# Patient Record
Sex: Female | Born: 1970 | Race: Black or African American | Hispanic: No | Smoking: Former smoker
Health system: Southern US, Community
[De-identification: ages and names within clinical notes are randomized; demographics above are authoritative.]

## PROBLEM LIST (undated history)

## (undated) DIAGNOSIS — I517 Cardiomegaly: Secondary | ICD-10-CM

## (undated) DIAGNOSIS — D259 Leiomyoma of uterus, unspecified: Secondary | ICD-10-CM

## (undated) DIAGNOSIS — D649 Anemia, unspecified: Secondary | ICD-10-CM

## (undated) DIAGNOSIS — K5909 Other constipation: Secondary | ICD-10-CM

## (undated) DIAGNOSIS — Z5189 Encounter for other specified aftercare: Secondary | ICD-10-CM

## (undated) DIAGNOSIS — I10 Essential (primary) hypertension: Secondary | ICD-10-CM

## (undated) HISTORY — PX: TUBAL LIGATION: SHX77

---

## 1998-04-03 ENCOUNTER — Encounter: Admission: RE | Admit: 1998-04-03 | Discharge: 1998-04-03 | Payer: Self-pay | Admitting: Family Medicine

## 1998-06-24 ENCOUNTER — Encounter: Admission: RE | Admit: 1998-06-24 | Discharge: 1998-06-24 | Payer: Self-pay | Admitting: Family Medicine

## 1998-11-03 ENCOUNTER — Emergency Department (HOSPITAL_COMMUNITY): Admission: EM | Admit: 1998-11-03 | Discharge: 1998-11-04 | Payer: Self-pay | Admitting: Emergency Medicine

## 1999-06-13 ENCOUNTER — Emergency Department (HOSPITAL_COMMUNITY): Admission: EM | Admit: 1999-06-13 | Discharge: 1999-06-13 | Payer: Self-pay | Admitting: Emergency Medicine

## 1999-11-17 ENCOUNTER — Emergency Department (HOSPITAL_COMMUNITY): Admission: EM | Admit: 1999-11-17 | Discharge: 1999-11-18 | Payer: Self-pay | Admitting: *Deleted

## 1999-11-18 ENCOUNTER — Encounter: Payer: Self-pay | Admitting: Emergency Medicine

## 1999-12-31 ENCOUNTER — Emergency Department (HOSPITAL_COMMUNITY): Admission: EM | Admit: 1999-12-31 | Discharge: 1999-12-31 | Payer: Self-pay | Admitting: Emergency Medicine

## 2000-03-11 ENCOUNTER — Emergency Department (HOSPITAL_COMMUNITY): Admission: EM | Admit: 2000-03-11 | Discharge: 2000-03-11 | Payer: Self-pay | Admitting: Emergency Medicine

## 2000-05-09 ENCOUNTER — Other Ambulatory Visit: Admission: RE | Admit: 2000-05-09 | Discharge: 2000-05-09 | Payer: Self-pay | Admitting: Obstetrics

## 2000-06-05 ENCOUNTER — Emergency Department (HOSPITAL_COMMUNITY): Admission: EM | Admit: 2000-06-05 | Discharge: 2000-06-05 | Payer: Self-pay | Admitting: Emergency Medicine

## 2000-09-30 ENCOUNTER — Emergency Department (HOSPITAL_COMMUNITY): Admission: EM | Admit: 2000-09-30 | Discharge: 2000-10-01 | Payer: Self-pay | Admitting: Emergency Medicine

## 2000-10-09 ENCOUNTER — Encounter: Payer: Self-pay | Admitting: Chiropractor

## 2000-10-09 ENCOUNTER — Ambulatory Visit: Admission: RE | Admit: 2000-10-09 | Discharge: 2000-10-09 | Payer: Self-pay | Admitting: Chiropractor

## 2000-10-22 ENCOUNTER — Emergency Department (HOSPITAL_COMMUNITY): Admission: EM | Admit: 2000-10-22 | Discharge: 2000-10-22 | Payer: Self-pay | Admitting: Emergency Medicine

## 2000-11-18 ENCOUNTER — Emergency Department (HOSPITAL_COMMUNITY): Admission: EM | Admit: 2000-11-18 | Discharge: 2000-11-18 | Payer: Self-pay | Admitting: Emergency Medicine

## 2001-01-24 ENCOUNTER — Emergency Department (HOSPITAL_COMMUNITY): Admission: EM | Admit: 2001-01-24 | Discharge: 2001-01-24 | Payer: Self-pay | Admitting: Emergency Medicine

## 2002-10-08 ENCOUNTER — Emergency Department (HOSPITAL_COMMUNITY): Admission: EM | Admit: 2002-10-08 | Discharge: 2002-10-08 | Payer: Self-pay | Admitting: Emergency Medicine

## 2002-11-16 ENCOUNTER — Emergency Department (HOSPITAL_COMMUNITY): Admission: EM | Admit: 2002-11-16 | Discharge: 2002-11-16 | Payer: Self-pay | Admitting: Emergency Medicine

## 2003-05-15 ENCOUNTER — Emergency Department (HOSPITAL_COMMUNITY): Admission: EM | Admit: 2003-05-15 | Discharge: 2003-05-15 | Payer: Self-pay | Admitting: Emergency Medicine

## 2003-09-08 ENCOUNTER — Emergency Department (HOSPITAL_COMMUNITY): Admission: EM | Admit: 2003-09-08 | Discharge: 2003-09-08 | Payer: Self-pay | Admitting: Emergency Medicine

## 2003-10-17 ENCOUNTER — Emergency Department (HOSPITAL_COMMUNITY): Admission: EM | Admit: 2003-10-17 | Discharge: 2003-10-17 | Payer: Self-pay | Admitting: Emergency Medicine

## 2004-01-23 ENCOUNTER — Emergency Department (HOSPITAL_COMMUNITY): Admission: EM | Admit: 2004-01-23 | Discharge: 2004-01-24 | Payer: Self-pay | Admitting: Emergency Medicine

## 2005-02-21 ENCOUNTER — Emergency Department (HOSPITAL_COMMUNITY): Admission: EM | Admit: 2005-02-21 | Discharge: 2005-02-21 | Payer: Self-pay | Admitting: Emergency Medicine

## 2005-08-13 ENCOUNTER — Emergency Department (HOSPITAL_COMMUNITY): Admission: EM | Admit: 2005-08-13 | Discharge: 2005-08-13 | Payer: Self-pay | Admitting: Emergency Medicine

## 2007-09-10 ENCOUNTER — Emergency Department (HOSPITAL_COMMUNITY): Admission: EM | Admit: 2007-09-10 | Discharge: 2007-09-10 | Payer: Self-pay | Admitting: Emergency Medicine

## 2009-02-03 ENCOUNTER — Emergency Department (HOSPITAL_COMMUNITY): Admission: EM | Admit: 2009-02-03 | Discharge: 2009-02-03 | Payer: Self-pay | Admitting: Family Medicine

## 2009-07-18 ENCOUNTER — Emergency Department (HOSPITAL_COMMUNITY): Admission: EM | Admit: 2009-07-18 | Discharge: 2009-07-18 | Payer: Self-pay | Admitting: Emergency Medicine

## 2010-01-09 ENCOUNTER — Emergency Department (HOSPITAL_COMMUNITY): Admission: EM | Admit: 2010-01-09 | Discharge: 2010-01-09 | Payer: Self-pay | Admitting: Emergency Medicine

## 2010-03-18 ENCOUNTER — Emergency Department (HOSPITAL_COMMUNITY): Admission: EM | Admit: 2010-03-18 | Discharge: 2010-03-18 | Payer: Self-pay | Admitting: Emergency Medicine

## 2010-08-16 ENCOUNTER — Emergency Department (HOSPITAL_COMMUNITY): Admission: EM | Admit: 2010-08-16 | Discharge: 2010-08-16 | Payer: Self-pay | Admitting: Emergency Medicine

## 2010-10-26 ENCOUNTER — Emergency Department (HOSPITAL_COMMUNITY): Admission: EM | Admit: 2010-10-26 | Discharge: 2010-10-26 | Payer: Self-pay | Admitting: Family Medicine

## 2010-11-08 ENCOUNTER — Emergency Department (HOSPITAL_COMMUNITY): Admission: EM | Admit: 2010-11-08 | Discharge: 2010-11-08 | Payer: Self-pay | Admitting: Emergency Medicine

## 2011-01-09 ENCOUNTER — Encounter: Payer: Self-pay | Admitting: Internal Medicine

## 2011-01-26 ENCOUNTER — Emergency Department (HOSPITAL_COMMUNITY)
Admission: EM | Admit: 2011-01-26 | Discharge: 2011-01-26 | Disposition: A | Discharge status: Home / Self Care | Payer: Medicaid Other | Attending: Emergency Medicine | Admitting: Emergency Medicine

## 2011-01-26 DIAGNOSIS — R300 Dysuria: Secondary | ICD-10-CM | POA: Insufficient documentation

## 2011-01-26 DIAGNOSIS — I10 Essential (primary) hypertension: Secondary | ICD-10-CM | POA: Insufficient documentation

## 2011-01-26 DIAGNOSIS — N390 Urinary tract infection, site not specified: Secondary | ICD-10-CM | POA: Insufficient documentation

## 2011-01-26 LAB — URINE MICROSCOPIC-ADD ON

## 2011-01-26 LAB — URINALYSIS, ROUTINE W REFLEX MICROSCOPIC
Hgb urine dipstick: NEGATIVE
Specific Gravity, Urine: 1.017 (ref 1.005–1.030)
Urobilinogen, UA: 0.2 mg/dL (ref 0.0–1.0)
pH: 6.5 (ref 5.0–8.0)

## 2011-03-01 ENCOUNTER — Inpatient Hospital Stay (INDEPENDENT_AMBULATORY_CARE_PROVIDER_SITE_OTHER)
Admission: RE | Admit: 2011-03-01 | Discharge: 2011-03-01 | Disposition: A | Discharge status: Home / Self Care | Payer: Self-pay | Source: Ambulatory Visit | Attending: Family Medicine | Admitting: Family Medicine

## 2011-03-01 DIAGNOSIS — R300 Dysuria: Secondary | ICD-10-CM

## 2011-03-01 DIAGNOSIS — I10 Essential (primary) hypertension: Secondary | ICD-10-CM

## 2011-03-01 LAB — WET PREP, GENITAL
Clue Cells Wet Prep HPF POC: NONE SEEN
Trich, Wet Prep: NONE SEEN
WBC, Wet Prep HPF POC: NONE SEEN

## 2011-03-01 LAB — POCT PREGNANCY, URINE: Preg Test, Ur: NEGATIVE

## 2011-03-01 LAB — GC/CHLAMYDIA PROBE AMP, GENITAL: GC Probe Amp, Genital: NEGATIVE

## 2011-03-01 LAB — POCT URINALYSIS DIPSTICK
Bilirubin Urine: NEGATIVE
Ketones, ur: NEGATIVE mg/dL

## 2011-03-02 LAB — URINE CULTURE

## 2011-03-07 LAB — URINALYSIS, ROUTINE W REFLEX MICROSCOPIC
Leukocytes, UA: NEGATIVE
Nitrite: NEGATIVE
Specific Gravity, Urine: 1.019 (ref 1.005–1.030)
pH: 6 (ref 5.0–8.0)

## 2011-03-07 LAB — URINE MICROSCOPIC-ADD ON

## 2011-03-27 LAB — URINALYSIS, ROUTINE W REFLEX MICROSCOPIC
Bilirubin Urine: NEGATIVE
Protein, ur: 100 mg/dL — AB
Specific Gravity, Urine: 1.021 (ref 1.005–1.030)
Urobilinogen, UA: 0.2 mg/dL (ref 0.0–1.0)

## 2011-03-27 LAB — URINE MICROSCOPIC-ADD ON

## 2011-07-05 ENCOUNTER — Inpatient Hospital Stay (INDEPENDENT_AMBULATORY_CARE_PROVIDER_SITE_OTHER)
Admission: RE | Admit: 2011-07-05 | Discharge: 2011-07-05 | Disposition: A | Discharge status: Home / Self Care | Payer: Medicaid Other | Source: Ambulatory Visit | Attending: Emergency Medicine | Admitting: Emergency Medicine

## 2011-07-05 DIAGNOSIS — N760 Acute vaginitis: Secondary | ICD-10-CM

## 2011-07-05 DIAGNOSIS — A499 Bacterial infection, unspecified: Secondary | ICD-10-CM

## 2011-07-05 LAB — WET PREP, GENITAL
Trich, Wet Prep: NONE SEEN
Yeast Wet Prep HPF POC: NONE SEEN

## 2011-09-29 LAB — URINALYSIS, ROUTINE W REFLEX MICROSCOPIC
Bilirubin Urine: NEGATIVE
Glucose, UA: NEGATIVE
Ketones, ur: NEGATIVE
Protein, ur: 100 — AB

## 2011-09-29 LAB — URINE MICROSCOPIC-ADD ON

## 2012-03-06 ENCOUNTER — Encounter (HOSPITAL_COMMUNITY): Payer: Self-pay | Admitting: *Deleted

## 2012-03-06 ENCOUNTER — Emergency Department (INDEPENDENT_AMBULATORY_CARE_PROVIDER_SITE_OTHER)
Admission: EM | Admit: 2012-03-06 | Discharge: 2012-03-06 | Disposition: A | Discharge status: Home / Self Care | Payer: Self-pay | Source: Home / Self Care | Attending: Emergency Medicine | Admitting: Emergency Medicine

## 2012-03-06 DIAGNOSIS — J019 Acute sinusitis, unspecified: Secondary | ICD-10-CM

## 2012-03-06 DIAGNOSIS — J069 Acute upper respiratory infection, unspecified: Secondary | ICD-10-CM

## 2012-03-06 HISTORY — DX: Essential (primary) hypertension: I10

## 2012-03-06 LAB — POCT RAPID STREP A: Streptococcus, Group A Screen (Direct): NEGATIVE

## 2012-03-06 MED ORDER — BENZONATATE 200 MG PO CAPS: 200.0000 mg | ORAL_CAPSULE | Freq: Three times a day (TID) | ORAL | Status: AC | PRN

## 2012-03-06 MED ORDER — AZITHROMYCIN 250 MG PO TABS: ORAL_TABLET | ORAL | Status: AC

## 2012-03-06 NOTE — ED Notes (Signed)
Gradual onset 4 days ago sore throat, runny nose, bilateral  Earache and low grade fever.  Today c/o productive cough, and generalized myalgias

## 2012-03-06 NOTE — ED Provider Notes (Signed)
Chief Complaint  Patient presents with  . URI    History of Present Illness:   The patient is a 41 year old female who's had a four-day history of fever, chills, earache, sore throat, cough productive yellow-green sputum, chest pain when she coughs, nasal congestion with yellow-green drainage, sinus pain, and headache. She denies any wheezing, nausea, vomiting, or diarrhea.  Review of Systems:  Other than noted above, the patient denies any of the following symptoms. Systemic:  No fever, chills, sweats, fatigue, myalgias, headache, or anorexia. Eye:  No redness, pain or drainage. ENT:  No earache, nasal congestion, rhinorrhea, sinus pressure, or sore throat. Lungs:  No cough, sputum production, wheezing, shortness of breath. Or chest pain. GI:  No nausea, vomiting, abdominal pain or diarrhea. Skin:  No rash or itching.  PMFSH:  Past medical history, family history, social history, meds, and allergies were reviewed.  Physical Exam:   Vital signs:  BP 169/87  Pulse 68  Temp(Src) 98.9 F (37.2 C) (Oral)  Resp 20  SpO2 100%  LMP 02/03/2012 General:  Alert, in no distress. Eye:  No conjunctival injection or drainage. ENT:  TMs and canals were normal, without erythema or inflammation.  Nasal mucosa was clear and uncongested, without drainage.  Mucous membranes were moist.  Pharynx was clear, without exudate or drainage.  There were no oral ulcerations or lesions. Neck:  Supple, no adenopathy, tenderness or mass. Lungs:  No respiratory distress.  Lungs were clear to auscultation, without wheezes, rales or rhonchi.  Breath sounds were clear and equal bilaterally. Heart:  Regular rhythm, without gallops, murmers or rubs. Skin:  Clear, warm, and dry, without rash or lesions.  Labs:   Results for orders placed during the hospital encounter of 03/06/12  POCT RAPID STREP A ([facility] URG CARE ONLY)      Component Value Range   Streptococcus, Group A Screen (Direct) NEGATIVE  NEGATIVE       Radiology:  No results found.  Assessment:   Diagnoses that have been ruled out:  None  Diagnoses that are still under consideration:  None  Final diagnoses:  Viral upper respiratory infection  Acute sinus infection      Plan:   1.  The following meds were prescribed:   New Prescriptions   AZITHROMYCIN (ZITHROMAX Z-PAK) 250 MG TABLET    Take as directed.   BENZONATATE (TESSALON) 200 MG CAPSULE    Take 1 capsule (200 mg total) by mouth 3 (three) times daily as needed for cough.   2.  The patient was instructed in symptomatic care and handouts were given. 3.  The patient was told to return if becoming worse in any way, if no better in 3 or 4 days, and given some red flag symptoms that would indicate earlier return.   Reuben Likes, MD 03/06/12 1524

## 2012-03-06 NOTE — Discharge Instructions (Signed)
Most upper respiratory infections are caused by viruses and do not require antibiotics.  We try to save the antibiotics for when we really need them to avoid resistance.  This does not mean that there is nothing that can be done.  Here are a few hints about things that can be done at home to get over an upper respiratory infection quicker:  Get extra sleep and extra fluids.  Get 7 to 9 hours of sleep per night and 6 to 8 glasses of water a day.  Getting extra sleep keeps the immune system from getting run down.  Most people with an upper respiratory infection are a little dehydrated.  The extra fluids also keep the secretions liquified and easier to deal with.  Also, get extra vitamin C.  4000 mg per day is the recommended dose. For the aches, headache, and fever, acetaminophen or ibuprofen are helpful.  These can be alternated every 4 hours.  People with liver disease should avoid large amounts of acetaminophen, and people with ulcer disease, gastroesophageal reflux, gastritis, congestive heart failure, chronic kidney disease, coronary artery disease and the elderly should avoid ibuprofen. For nasal congestion try Mucinex-D, or if you're having lots of sneezing or copious clear nasal drainage Allegra-D-24 hour.  A Saline nasal spray such as Ocean Spray can also help as can decongestant sprays such as Afrin, but you should not use the decongestant sprays for more than 3 or 4 days since they can be habituating.  If nasal dryness is a problem, Ayr Nasal Gel can help moisturize your nasal passages.  Breath Rite nasal strips can also offer a non-drug alternative treatment to nasal congestion, especially at night. For people with symptoms of sinusitis, sleeping with your head elevated can be helpful.  For sinus pain, moist, hot compresses to the face may provide some relief.  Many people find that inhaling steam as in a shower or from a pot of steaming water can help. For sore throat, zinc containing lozenges such  as Cold-Eze or Zicam are helpful.  Zinc helps to fight infection and has a mild astringent effect that relieves the sore, achey throat.  Hot salt water gargles (8 oz of hot water, 1/2 tsp of table salt, and a pinch of baking soda) can give relief as well as hot beverages such as hot tea. For the cough, old time remedies such as honey or honey and lemon are tried and true.  Over the counter cough syrups such as Delsym 2 tsp every 12 hours can help as well.  It's important when you have an upper respiratory infection not to pass the infection to others.  This involves being very careful about the following:  Frequent hand washing or use of hand sanitizer, especially after coughing, sneezing, blowing your nose or touching your face, nose or eyes. Do not shake hands or touch anyone and try to avoid touching surfaces that other people use such as doorknobs, shopping carts, telephones and computer keyboards. Use tissues and dispose of them properly in a garbage can or ziplock bag. Cough into your sleeve. Do not let others eat or drink after you.  It's also important to recognize the signs of serious illness and get evaluated if they occur: Any respiratory infection that lasts more than 7 to 10 days.  Yellow nasal drainage and sputum are not reliable indicators of a bacterial infection, but if they last for more than 1 week, see your doctor. Fever and sore throat can indicate strep. Fever   and cough can indicate influenza or pneumonia. Any kind of severe symptom such as difficulty breathing, intractable vomiting, or severe pain should prompt you to see a doctor as soon as possible.   Your body's immune system is really the thing that will get rid of this infection.  Your immune system is comprised of 2 types of specialized cells called T cells and B cells.  T cells coordinate the array of cells in your body that engulf invading bacteria or viruses while B cells orchestrate the production of antibodies that  neutralize infection.  Anything we do or any medications we give you, will just strengthen your immune system or help it clear up the infection quicker.  Here are a few helpful hints to improve your immune system to help overcome this illness or to prevent future infections:  A few vitamins can improve the health of your immune system.  That's why your diet should include plenty of fruits, vegetables, fish, nuts, and whole grains.  Vitamin A and bet-carotene can increase the cells that fight infections (T cells and B cells).  Vitamin A is abundant in dark greens and orange vegetables such as spinach, greens, sweet potatoes, and carrots.  Vitamin B6 contributes to the maturation of white blood cells, the cells that fight disease.  Foods with vitamin B6 include cold cereal and bananas.  Vitamin C is credited with preventing colds because it increases white blood cells and also prevents cellular damage.  Citrus fruits, peaches and green and red bell peppers are all hight in vitamin C.  Vitamin E is an anti-oxidant that encourages the production of natural killer cells which reject foreign invaders and B cells that produce antibodies.  Foods high in vitamin E include wheat germ, nuts and seeds.  Foods high in omega-3 fatty acids found in foods like salmon, tuna and mackerel boost your immune system and help cells to engulf and absorb germs.  Probiotics are good bacteria that increase your T cells.  These can be found in yogurt and are available in supplements such as Culturelle or Align.  Moderate exercise increases the strength of your immune system and your ability to recover from illness.  I suggest 3 to 5 moderate intensity 30 minute workouts per week.    Sleep is another component of maintaining a strong immune system.  It enables your body to recuperate from the day's activities, stress and work.  My recommendation is to get between 7 and 9 hours of sleep per night.  If you smoke, try to quit  completely or at least cut down.  Drink alcohol only in moderation if at all.  No more than 2 drinks daily for men or 1 for women.  Get a flu vaccine early in the fall or if you have not gotten one yet, once this illness has run its course.  If you are over 65, a smoker, or an asthmatic, get a pneumococcal vaccine.  My final recommendation is to maintain a healthy weight.  Excess weight can impair the immune system by interfering with the way the immune system deals with invading viruses or bacteria.    Go to www.goodrx.com to look up your medications. This will give you a list of where you can find your prescriptions at the most affordable prices.   RESOURCE GUIDE  Chronic Pain Problems Contact [facility]  [phone] Patients need to be referred by their primary care doctor.  Insufficient Money for Medicine Contact [employer]:  call "211" or [facility] [phone].  No Primary Care Doctor Call [facility]  [phone] Other agencies that provide inexpensive medical care    [facility]  [phone]    [facility]  [phone]    [facility]  [phone]    [facility]  [phone] [address] [place] [address]    [facility]  [phone]    [facility]  [phone] [facility] [phone]   [address] [place]. [address] [place], [place] [address]  Psychological Services [facility]  [phone] [facility]  [phone] [facility] Mental [facility]   [phone] (emergency services [phone])  Substance Abuse Resources [facility]  [phone] [facility] [phone] [facility] [phone] Floydene Flock [phone] Residential & Outpatient Substance Abuse Program  [phone]  Abuse/Neglect [facility] Child Abuse Hotline 505-800-4332 [facility] Child Abuse Hotline [phone] (After  Hours)  Emergency Shelter [facility] [phone]  Maternity Homes [facility] [place] [facility] [phone] [facility] [phone]  MRSA Hotline #:   321-599-7591                                                Crist[facility] Goldmann Phone:  [phone]                                   Phone:  [phone]                 Phone:  [phone]  [facility] Mental Health Phone:  [phone]  [facility] Child Abuse Hotline 828-775-2318    [facility] Resources  [facility]     United Way                          [facility] Dept. [address] [place]                       [address]      [address] [place] [address]   [phone] (After Hours)

## 2012-05-30 ENCOUNTER — Emergency Department (INDEPENDENT_AMBULATORY_CARE_PROVIDER_SITE_OTHER)
Admission: EM | Admit: 2012-05-30 | Discharge: 2012-05-30 | Disposition: A | Discharge status: Home / Self Care | Payer: Self-pay | Source: Home / Self Care | Attending: Emergency Medicine | Admitting: Emergency Medicine

## 2012-05-30 ENCOUNTER — Encounter (HOSPITAL_COMMUNITY): Payer: Self-pay | Admitting: *Deleted

## 2012-05-30 DIAGNOSIS — M25569 Pain in unspecified knee: Secondary | ICD-10-CM

## 2012-05-30 DIAGNOSIS — R05 Cough: Secondary | ICD-10-CM

## 2012-05-30 DIAGNOSIS — N898 Other specified noninflammatory disorders of vagina: Secondary | ICD-10-CM

## 2012-05-30 HISTORY — DX: Leiomyoma of uterus, unspecified: D25.9

## 2012-05-30 MED ORDER — ACETAMINOPHEN-CODEINE #3 300-30 MG PO TABS: 1.0000 | ORAL_TABLET | Freq: Four times a day (QID) | ORAL | Status: AC | PRN

## 2012-05-30 MED ORDER — METRONIDAZOLE 500 MG PO TABS: 500.0000 mg | ORAL_TABLET | Freq: Two times a day (BID) | ORAL | Status: AC

## 2012-05-30 NOTE — ED Provider Notes (Signed)
History     CSN: [id number]  Arrival date & time 05/30/12  1146   First MD Initiated Contact with Patient 05/30/12 1154      Chief Complaint  Patient presents with  . Leg Pain    (Consider location/radiation/quality/duration/timing/severity/associated sxs/prior treatment) HPI Comments: My son had pneumonia, and I have been coughing for almost 2 weeks, no fevers, no SOB, but the cough is not going away. My R knee has been hurting as well, i have not fallen or injured my knee in any way"  I also been having this discharge with an odor  for more than a week, feels like BV,with a strong odor. No pelvic pain, No urinary symptoms, no fevers  Patient is a 41 y.o. female presenting with leg pain. The history is provided by the patient.  Leg Pain  The incident occurred more than 1 week ago. The incident occurred at home. There was no injury mechanism. The pain is present in the right knee. The pain is at a severity of 5/10. The pain is moderate. Pertinent negatives include no numbness, no inability to bear weight, no loss of motion, no muscle weakness, no loss of sensation and no tingling.    Past Medical History  Diagnosis Date  . Hypertension   . Uterine fibroid     Past Surgical History  Procedure Date  . Tubal ligation     Family History  Problem Relation Age of Onset  . Hypertension Mother     History  Substance Use Topics  . Smoking status: Current Some Day Smoker    Types: Cigarettes    Last Attempt to Quit: 02/23/2011  . Smokeless tobacco: Not on file  . Alcohol Use: Yes     occasionally    OB History    Grav Para Term Preterm Abortions TAB SAB Ect Mult Living                  Review of Systems  Constitutional: Negative for fever, chills, activity change, appetite change and fatigue.  HENT: Positive for ear pain. Negative for hearing loss, neck pain and ear discharge.   Respiratory: Positive for cough. Negative for chest tightness, shortness of breath,  wheezing and stridor.   Musculoskeletal: Positive for joint swelling.  Skin: Negative for pallor and rash.  Neurological: Negative for tingling and numbness.    Allergies  Penicillins  Home Medications   Current Outpatient Rx  Name Route Sig Dispense Refill  . HYDROCHLOROTHIAZIDE 25 MG PO TABS Oral Take 25 mg by mouth daily.    . ACETAMINOPHEN-CODEINE #3 300-30 MG PO TABS Oral Take 1-2 tablets by mouth every 6 (six) hours as needed for pain. 15 tablet 0  . METRONIDAZOLE 500 MG PO TABS Oral Take 1 tablet (500 mg total) by mouth 2 (two) times daily. 14 tablet 0    BP 177/81  Pulse 62  Temp 98 F (36.7 C) (Oral)  Resp 20  SpO2 100%  LMP 05/07/2012  Physical Exam  Nursing note and vitals reviewed. Constitutional: She appears well-developed and well-nourished.  Eyes: Conjunctivae are normal. Right eye exhibits no discharge. Left eye exhibits no discharge.  Neck: Neck supple. No JVD present.  Cardiovascular: Normal rate.   No murmur heard. Pulmonary/Chest: Effort normal and breath sounds normal. No respiratory distress. She has no wheezes. She has no rales.  Abdominal: Soft. She exhibits no distension.  Lymphadenopathy:    She has no cervical adenopathy.  Skin: Skin is warm.  ED Course  Procedures (including critical care time)  Labs Reviewed - No data to display No results found.   1. Cough   2. Knee pain   3. Vaginal Discharge       MDM  Respiratory exam normal. Patient with recurrent R knee effusions, and requested treatment for BV, ( Refusing exam and requesting referral for Gyn.        Jimmie Molly, MD 05/30/12 [phone]

## 2012-05-30 NOTE — Discharge Instructions (Signed)
AS. discussed take this medicine for both your R knee pain and your residual cough. Your exam was not consistent with pneumonia, any concerns or further symptoms should return for recheck.  If you've continue with right knee pain. Should followup with the orthopedic Dr. As your knee effusion might get worse and might need aspiration.  You also described symptoms consistent of vaginitis and requested a Flagyl prescription I have agreed to do this as you described having had this before any particular order. See discharge instructions for you to establish with a gynecologist.    Cough, Adult  A cough is a reflex that helps clear your throat and airways. It can help heal the body or may be a reaction to an irritated airway. A cough may only last 2 or 3 weeks (acute) or may last more than 8 weeks (chronic).  CAUSES Acute cough:  Viral or bacterial infections.  Chronic cough:  Infections.   Allergies.   Asthma.   Post-nasal drip.   Smoking.   Heartburn or acid reflux.   Some medicines.   Chronic lung problems (COPD).   Cancer.  SYMPTOMS   Cough.   Fever.   Chest pain.   Increased breathing rate.   High-pitched whistling sound when breathing (wheezing).   Colored mucus that you cough up (sputum).  TREATMENT   A bacterial cough may be treated with antibiotic medicine.   A viral cough must run its course and will not respond to antibiotics.   Your caregiver may recommend other treatments if you have a chronic cough.  HOME CARE INSTRUCTIONS   Only take over-the-counter or prescription medicines for pain, discomfort, or fever as directed by your caregiver. Use cough suppressants only as directed by your caregiver.   Use a cold steam vaporizer or humidifier in your bedroom or home to help loosen secretions.   Sleep in a semi-upright position if your cough is worse at night.   Rest as needed.   Stop smoking if you smoke.  SEEK IMMEDIATE MEDICAL CARE IF:   You  have pus in your sputum.   Your cough starts to worsen.   You cannot control your cough with suppressants and are losing sleep.   You begin coughing up blood.   You have difficulty breathing.   You develop pain which is getting worse or is uncontrolled with medicine.   You have a fever.  MAKE SURE YOU:   Understand these instructions.   Will watch your condition.   Will get help right away if you are not doing well or get worse.  Document Released: 06/03/2011 Document Revised: 11/24/2011 Document Reviewed: 06/03/2011 [facility] Patient Information 2012 [place], [place].Arthralgia Your caregiver has diagnosed you as suffering from an arthralgia. Arthralgia means there is pain in a joint. This can come from many reasons including:  Bruising the joint which causes soreness (inflammation) in the joint.   Wear and tear on the joints which occur as we grow older (osteoarthritis).   Overusing the joint.   Various forms of arthritis.   Infections of the joint.  Regardless of the cause of pain in your joint, most of these different pains respond to anti-inflammatory drugs and rest. The exception to this is when a joint is infected, and these cases are treated with antibiotics, if it is a bacterial infection. HOME CARE INSTRUCTIONS   Rest the injured area for as long as directed by your caregiver. Then slowly start using the joint as directed by your caregiver and as  the pain allows. Crutches as directed may be useful if the ankles, knees or hips are involved. If the knee was splinted or casted, continue use and care as directed. If an stretchy or elastic wrapping bandage has been applied today, it should be removed and re-applied every 3 to 4 hours. It should not be applied tightly, but firmly enough to keep swelling down. Watch toes and feet for swelling, bluish discoloration, coldness, numbness or excessive pain. If any of these problems (symptoms) occur, remove the ace bandage and re-apply  more loosely. If these symptoms persist, contact your caregiver or return to this location.   For the first 24 hours, keep the injured extremity elevated on pillows while lying down.   Apply ice for 15 to 20 minutes to the sore joint every couple hours while awake for the first half day. Then 3 to 4 times per day for the first 48 hours. Put the ice in a plastic bag and place a towel between the bag of ice and your skin.   Wear any splinting, casting, elastic bandage applications, or slings as instructed.   Only take over-the-counter or prescription medicines for pain, discomfort, or fever as directed by your caregiver. Do not use aspirin immediately after the injury unless instructed by your physician. Aspirin can cause increased bleeding and bruising of the tissues.   If you were given crutches, continue to use them as instructed and do not resume weight bearing on the sore joint until instructed.  Persistent pain and inability to use the sore joint as directed for more than 2 to 3 days are warning signs indicating that you should see a caregiver for a follow-up visit as soon as possible. Initially, a hairline fracture (break in bone) may not be evident on X-rays. Persistent pain and swelling indicate that further evaluation, non-weight bearing or use of the joint (use of crutches or slings as instructed), or further X-rays are indicated. X-rays may sometimes not show a small fracture until a week or 10 days later. Make a follow-up appointment with your own caregiver or one to whom we have referred you. A radiologist (specialist in reading X-rays) may read your X-rays. Make sure you know how you are to obtain your X-ray results. Do not assume everything is normal if you do not hear from [place]. SEEK MEDICAL CARE IF: Bruising, swelling, or pain increases. SEEK IMMEDIATE MEDICAL CARE IF:   Your fingers or toes are numb or blue.   The pain is not responding to medications and continues to stay the same  or get worse.   The pain in your joint becomes severe.   You develop a fever over 102 F (38.9 C).   It becomes impossible to move or use the joint.  MAKE SURE YOU:   Understand these instructions.   Will watch your condition.   Will get help right away if you are not doing well or get worse.  Document Released: 12/05/2005 Document Revised: 11/24/2011 Document Reviewed: 07/23/2008 [facility] Patient Information 2012 [place], [place].

## 2012-05-30 NOTE — ED Notes (Signed)
Pt reports 2 weeks of a nonproductive cough and several days of right ear pain   Denies fever .  Also she c/o pain under right knee X 2 weeks   Denies any recent injury

## 2012-10-29 ENCOUNTER — Encounter (HOSPITAL_COMMUNITY): Payer: Self-pay

## 2012-10-29 ENCOUNTER — Emergency Department (INDEPENDENT_AMBULATORY_CARE_PROVIDER_SITE_OTHER)
Admission: EM | Admit: 2012-10-29 | Discharge: 2012-10-29 | Disposition: A | Discharge status: Home / Self Care | Payer: Medicaid Other | Source: Home / Self Care | Attending: Family Medicine | Admitting: Family Medicine

## 2012-10-29 DIAGNOSIS — I10 Essential (primary) hypertension: Secondary | ICD-10-CM

## 2012-10-29 DIAGNOSIS — J069 Acute upper respiratory infection, unspecified: Secondary | ICD-10-CM

## 2012-10-29 LAB — POCT I-STAT, CHEM 8
BUN: 7 mg/dL (ref 6–23)
Chloride: 105 mEq/L (ref 96–112)
HCT: 26 % — ABNORMAL LOW (ref 36.0–46.0)
Potassium: 3.9 mEq/L (ref 3.5–5.1)
Sodium: 141 mEq/L (ref 135–145)

## 2012-10-29 LAB — POCT URINALYSIS DIP (DEVICE)
Bilirubin Urine: NEGATIVE
Glucose, UA: NEGATIVE mg/dL
Ketones, ur: NEGATIVE mg/dL
Leukocytes, UA: NEGATIVE
Nitrite: NEGATIVE

## 2012-10-29 MED ORDER — AZITHROMYCIN 250 MG PO TABS
ORAL_TABLET | ORAL | Status: DC
Start: 2012-10-29 — End: 2014-07-09

## 2012-10-29 MED ORDER — SALINE NASAL SPRAY 0.65 % NA SOLN: 1.0000 | NASAL | Status: DC | PRN

## 2012-10-29 MED ORDER — HYDROCHLOROTHIAZIDE 25 MG PO TABS: 25.0000 mg | ORAL_TABLET | Freq: Every day | ORAL | Status: DC

## 2012-10-29 NOTE — ED Provider Notes (Signed)
Medical screening examination/treatment/procedure(s) were performed by resident physician or non-physician practitioner and as supervising physician I was immediately available for consultation/collaboration.   KINDL,JAMES DOUGLAS MD.    James D Kindl, MD 10/29/12 2034 

## 2012-10-29 NOTE — ED Notes (Addendum)
Cough (productive-yellow) , fatigue, right ear pain, congestion, drainage, states she does have hypertension but has no PCP at this time, has not taken HCTZ for approx 5 mos, patient also reports that she has a enlarged heart

## 2012-10-29 NOTE — ED Provider Notes (Signed)
History     CSN: [id number]  Arrival date & time 10/29/12  1227   First MD Initiated Contact with Patient 10/29/12 1436      Chief Complaint  Patient presents with  . URI    (Consider location/radiation/quality/duration/timing/severity/associated sxs/prior treatment) Patient is a 41 y.o. female presenting with URI. The history is provided by the patient.  URI The primary symptoms include fatigue, ear pain and cough. The current episode started more than 1 week ago. This is a new problem. The problem has not changed since onset. The fatigue began more than 1 week ago.  The ear pain began more than 2 days ago. Ear pain is a new problem. Both ears are affected. The pain is mild.    The cough began more than 1 week ago. The cough is new. The cough is productive and hoarse. The sputum is yellow. It is exacerbated by smoking.  The onset of the illness is associated with exposure to sick contacts and recent travel. Symptoms associated with the illness include chills, plugged ear sensation, congestion and rhinorrhea. The illness is not associated with facial pain or sinus pressure. The following treatments were addressed: Acetaminophen was not tried. A decongestant was not tried. Aspirin was not tried. NSAIDs were not tried.    Pt additionally reports she has been without her HBP medication for five months, known history of cardiomegaly with noted edema in bilateral extremities.  Denies chest pain and sob.  Past Medical History  Diagnosis Date  . Hypertension   . Uterine fibroid     Past Surgical History  Procedure Date  . Tubal ligation     Family History  Problem Relation Age of Onset  . Hypertension Mother     History  Substance Use Topics  . Smoking status: Current Some Day Smoker    Types: Cigarettes    Last Attempt to Quit: 02/23/2011  . Smokeless tobacco: Not on file  . Alcohol Use: Yes     Comment: occasionally    OB History    Grav Para Term Preterm Abortions  TAB SAB Ect Mult Living                  Review of Systems  Constitutional: Positive for chills and fatigue.  HENT: Positive for ear pain, congestion and rhinorrhea. Negative for sinus pressure.   Respiratory: Positive for cough.   Cardiovascular: Positive for leg swelling.  All other systems reviewed and are negative.    Allergies  Penicillins  Home Medications   Current Outpatient Rx  Name  Route  Sig  Dispense  Refill  . AZITHROMYCIN 250 MG PO TABS      Azithromycin 500mg  on day 1, then 250mg  on days 2-4   6 tablet   0   . HYDROCHLOROTHIAZIDE 25 MG PO TABS   Oral   Take 1 tablet (25 mg total) by mouth daily.   30 tablet   2   . SALINE NASAL SPRAY 0.65 % NA SOLN   Nasal   Place 1 spray into the nose as needed for congestion.   30 mL   12     BP 197/59  Pulse 64  Temp 98.5 F (36.9 C) (Oral)  Resp 16  SpO2 99%  Physical Exam  Nursing note and vitals reviewed. Constitutional: She is oriented to person, place, and time. Vital signs are normal. She appears well-developed and well-nourished. She is active and cooperative.  HENT:  Head: Normocephalic.  Right Ear: External  ear normal.  Left Ear: External ear normal.  Nose: Nose normal.  Mouth/Throat: Oropharynx is clear and moist. No oropharyngeal exudate.  Eyes: Conjunctivae normal and EOM are normal. Pupils are equal, round, and reactive to light. No scleral icterus.  Neck: Trachea normal and normal range of motion. Neck supple. No JVD present. Carotid bruit is not present. No thyromegaly present.  Cardiovascular: Normal rate, regular rhythm, normal heart sounds, intact distal pulses and normal pulses.        Bilateral nonpitting edema   Pulmonary/Chest: Effort normal and breath sounds normal.  Lymphadenopathy:       Head (right side): No submental, no submandibular, no tonsillar, no preauricular, no posterior auricular and no occipital adenopathy present.       Head (left side): No submental, no  submandibular, no tonsillar, no preauricular, no posterior auricular and no occipital adenopathy present.    She has no cervical adenopathy.  Neurological: She is alert and oriented to person, place, and time. No cranial nerve deficit or sensory deficit.  Skin: Skin is warm and dry.  Psychiatric: She has a normal mood and affect. Her speech is normal and behavior is normal. Judgment and thought content normal. Cognition and memory are normal.    ED Course  Procedures (including critical care time)  Labs Reviewed  POCT URINALYSIS DIP (DEVICE) - Abnormal; Notable for the following:    Protein, ur 100 (*)     All other components within normal limits  POCT I-STAT, CHEM 8 - Abnormal; Notable for the following:    Hemoglobin 8.8 (*)     HCT 26.0 (*)     All other components within normal limits   No results found.   1. Hypertension   2. URI (upper respiratory infection)       MDM  Take medications as prescribed.  It is imperative for you to follow up with a pcp in the next month for further evaluation and management of your hypertension.          Johnsie Kindred, NP 10/29/12 1527

## 2013-09-25 ENCOUNTER — Ambulatory Visit: Payer: Self-pay

## 2013-09-26 ENCOUNTER — Encounter: Payer: Self-pay | Admitting: Internal Medicine

## 2013-09-26 ENCOUNTER — Ambulatory Visit: Payer: Self-pay | Admitting: Internal Medicine

## 2014-04-17 ENCOUNTER — Encounter (HOSPITAL_COMMUNITY): Payer: Self-pay | Admitting: Emergency Medicine

## 2014-04-17 ENCOUNTER — Emergency Department (INDEPENDENT_AMBULATORY_CARE_PROVIDER_SITE_OTHER)
Admission: EM | Admit: 2014-04-17 | Discharge: 2014-04-17 | Disposition: A | Discharge status: Home / Self Care | Payer: Self-pay | Source: Home / Self Care | Attending: Emergency Medicine | Admitting: Emergency Medicine

## 2014-04-17 ENCOUNTER — Other Ambulatory Visit (HOSPITAL_COMMUNITY)
Admission: RE | Admit: 2014-04-17 | Discharge: 2014-04-17 | Disposition: A | Discharge status: Home / Self Care | Payer: Self-pay | Source: Ambulatory Visit | Attending: Emergency Medicine | Admitting: Emergency Medicine

## 2014-04-17 DIAGNOSIS — B9689 Other specified bacterial agents as the cause of diseases classified elsewhere: Secondary | ICD-10-CM

## 2014-04-17 DIAGNOSIS — D649 Anemia, unspecified: Secondary | ICD-10-CM

## 2014-04-17 DIAGNOSIS — I10 Essential (primary) hypertension: Secondary | ICD-10-CM

## 2014-04-17 DIAGNOSIS — A499 Bacterial infection, unspecified: Secondary | ICD-10-CM

## 2014-04-17 DIAGNOSIS — N760 Acute vaginitis: Secondary | ICD-10-CM

## 2014-04-17 DIAGNOSIS — Z113 Encounter for screening for infections with a predominantly sexual mode of transmission: Secondary | ICD-10-CM | POA: Insufficient documentation

## 2014-04-17 LAB — POCT I-STAT, CHEM 8
BUN: 11 mg/dL (ref 6–23)
CALCIUM ION: 1.18 mmol/L (ref 1.12–1.23)
CREATININE: 1.2 mg/dL — AB (ref 0.50–1.10)
Chloride: 103 mEq/L (ref 96–112)
GLUCOSE: 93 mg/dL (ref 70–99)
HEMATOCRIT: 23 % — AB (ref 36.0–46.0)
HEMOGLOBIN: 7.8 g/dL — AB (ref 12.0–15.0)
Potassium: 4 mEq/L (ref 3.7–5.3)
Sodium: 142 mEq/L (ref 137–147)
TCO2: 24 mmol/L (ref 0–100)

## 2014-04-17 LAB — POCT URINALYSIS DIP (DEVICE)
BILIRUBIN URINE: NEGATIVE
GLUCOSE, UA: NEGATIVE mg/dL
HGB URINE DIPSTICK: NEGATIVE
Ketones, ur: NEGATIVE mg/dL
Leukocytes, UA: NEGATIVE
NITRITE: NEGATIVE
Protein, ur: 100 mg/dL — AB
Specific Gravity, Urine: 1.025 (ref 1.005–1.030)
UROBILINOGEN UA: 0.2 mg/dL (ref 0.0–1.0)
pH: 6 (ref 5.0–8.0)

## 2014-04-17 MED ORDER — HYDROCHLOROTHIAZIDE 25 MG PO TABS: 25.0000 mg | ORAL_TABLET | Freq: Every day | ORAL | Status: DC

## 2014-04-17 MED ORDER — DOCUSATE SODIUM 100 MG PO CAPS: 100.0000 mg | ORAL_CAPSULE | Freq: Two times a day (BID) | ORAL | Status: DC

## 2014-04-17 MED ORDER — FERROUS SULFATE 325 (65 FE) MG PO TABS: 325.0000 mg | ORAL_TABLET | Freq: Three times a day (TID) | ORAL | Status: DC

## 2014-04-17 MED ORDER — METRONIDAZOLE 500 MG PO TABS
500.0000 mg | ORAL_TABLET | Freq: Two times a day (BID) | ORAL | Status: DC
Start: 2014-04-17 — End: 2015-08-06

## 2014-04-17 NOTE — ED Provider Notes (Signed)
Chief Complaint   Chief Complaint  Patient presents with  . Medication Refill  . Vaginitis    History of Present Illness   Vickie Patel is a 43 year old female who comes in today for refill on blood pressure pills. She's been out of her medication about 6 months. Prior to that she had been on hydrochlorothiazide 25 mg per day for years. While she was on them she had no medication side effects. Over the past 6 months she's had intermittent symptoms of headaches, spots in front of her eyes, chest tightness, aching in her arms, shortness of breath, and ankle swelling.  She also has a history of profound anemia in the past. She has received blood transfusions for this. 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  Finally, she is had some vaginal discharge for months with odor. She denies any itching, but she does have vaginal, vulvar, and pelvic pain. She did not want to have a pelvic exam today.  Review of Systems   Other than as noted above, the patient denies any of the following symptoms: Respiratory:  No coughing, wheezing, or shortness of breath. Cardiac:  No chest pain, tightness, pressure, palpitations, syncope, or edema. Neuro:  No headache, dizziness, blurred vision, weakness, paresthesias, or strokelike symptoms.   [place]   Past medical history, family history, social history, meds, and allergies were reviewed.    Physical Examination   Vital signs:  BP 179/100  Pulse 62  Temp(Src) 98 F (36.7 C) (Oral)  Resp 18  SpO2 100%  LMP 04/02/2014 General:  Alert, oriented, in no distress. Lungs:  Breath sounds clear and equal bilaterally.  No wheezes, rales, or rhonchi. Heart:  Regular rhythm, no gallops, murmers, clicks or rubs.  Abdomen:  Soft and flat.  Nontender, no organomegaly or mass.  No pulsatile midline abdominal mass or bruit. Ext:  No edema, pulses full. Neurological exam:  Alert and oriented.  Speech is clear.  No pronator drift.  CNs  intact.  Labs   Results for orders placed during the hospital encounter of 04/17/14  POCT URINALYSIS DIP (DEVICE)      Result Value Ref Range   Glucose, UA NEGATIVE  NEGATIVE mg/dL   Bilirubin Urine NEGATIVE  NEGATIVE   Ketones, ur NEGATIVE  NEGATIVE mg/dL   Specific Gravity, Urine 1.025  1.005 - 1.030   Hgb urine dipstick NEGATIVE  NEGATIVE   pH 6.0  5.0 - 8.0   Protein, ur 100 (*) NEGATIVE mg/dL   Urobilinogen, UA 0.2  0.0 - 1.0 mg/dL   Nitrite NEGATIVE  NEGATIVE   Leukocytes, UA NEGATIVE  NEGATIVE  POCT I-STAT, CHEM 8      Result Value Ref Range   Sodium 142  137 - 147 mEq/L   Potassium 4.0  3.7 - 5.3 mEq/L   Chloride 103  96 - 112 mEq/L   BUN 11  6 - 23 mg/dL   Creatinine, Ser 1.20 (*) 0.50 - 1.10 mg/dL   Glucose, Bld 93  70 - 99 mg/dL   Calcium, Ion 1.18  1.12 - 1.23 mmol/L   TCO2 24  0 - 100 mmol/L   Hemoglobin 7.8 (*) 12.0 - 15.0 g/dL   HCT 23.0 (*) 36.0 - 46.0 %    Urine DNA probes for gonorrhea, Chlamydia, Trichomonas, Gardnerella, Candida were obtained.  Assessment   The primary encounter diagnosis was Hypertension. Diagnoses of Anemia and Bacterial vaginal infection were also pertinent to this visit.  The patient  has severe anemia on her i-STAT 8. I offered to transfer her to the emergency department for consideration for transfusion, but she declined. She is willing to take ferrous sulfate 3 times a day and return for a recheck again in one week.  Also need to recheck her blood pressure in one week.  She declines pelvic exam, so testing was done per urine DNA probe.  Plan   1.  Meds:  The following meds were prescribed:   Discharge Medication List as of 04/17/2014  9:26 PM    START taking these medications   Details  docusate sodium (COLACE) 100 MG capsule Take 1 capsule (100 mg total) by mouth 2 (two) times daily., Starting 04/17/2014, Until Discontinued, Normal    ferrous sulfate 325 (65 FE) MG tablet Take 1 tablet (325 mg total) by mouth 3 (three) times  daily with meals., Starting 04/17/2014, Until Discontinued, Normal    !! hydrochlorothiazide (HYDRODIURIL) 25 MG tablet Take 1 tablet (25 mg total) by mouth daily., Starting 04/17/2014, Until Discontinued, Normal    metroNIDAZOLE (FLAGYL) 500 MG tablet Take 1 tablet (500 mg total) by mouth 2 (two) times daily., Starting 04/17/2014, Until Discontinued, Normal     !! - Potential duplicate medications found. Please discuss with provider.      2.  Patient Education/Counseling:  The patient was given appropriate handouts, self care instructions, and instructed in symptomatic relief. Specifically discussed salt and sodium restriction, weight control, and exercise.   3.  Follow up:  The patient was told to follow up here in one week, or sooner if becoming worse in any way, and given some red flag symptoms such as severe headache, vision changes, shortness of breath, chest pain or stroke like symptoms which would prompt immediate return.      Harden Mo, MD 04/17/14 [phone]

## 2014-04-17 NOTE — Discharge Instructions (Signed)
Blood pressure over the ideal can put you at higher risk for stroke, heart disease, and kidney failure.  For this reason, it's important to try to get your blood pressure as close as possible to the ideal.  The ideal blood pressure is 120/80.  Blood pressures from 469-629 systolic over 52-84 diastolic are labeled as "prehypertension."  This means you are at higher risk of developing hypertension in the future.  Blood pressures in this range are not treated with medication, but lifestyle changes are recommended to prevent progression to hypertension.  Blood pressures of 132 and above systolic over 90 and above diastolic are classified as hypertension and are treated with medications.  Lifestyle changes which can benefit both prehypertension and hypertension include the following:   Salt and sodium restriction.  Weight loss.  Regular exercise.  Avoidance of tobacco.  Avoidance of excess alcohol.  The "D.A.S.H" diet.   People with hypertension and prehypertension should limit their salt intake to less than 1500 mg daily.  Reading the nutrition information on the label of many prepared foods can give you an idea of how much sodium you're consuming at each meal.  Remember that the most important number on the nutrition information is the serving size.  It may be smaller than you think.  Try to avoid adding extra salt at the table.  You may add small amounts of salt while cooking.  Remember that salt is an acquired taste and you may get used to a using a whole lot less salt than you are using now.  Using less salt lets the food's natural flavors come through.  You might want to consider using salt substitutes, potassium chloride, pepper, or blends of herbs and spices to enhance the flavor of your food.  Foods that contain the most salt include: processed meats (like ham, bacon, lunch meat, sausage, hot dogs, and breakfast meat), chips, pretzels, salted nuts, soups, salty snacks, canned foods, junk  food, fast food, restaurant food, mustard, pickles, pizza, popcorn, soy sauce, and worcestershire sauce--quite a list!  You might ask, "Is there anything I can eat?"  The answer is, "yes."  Fruits and vegetables are usually low in salt.  Fresh is better than frozen which is better than canned.  If you have canned vegetables, you can cut down on the salt content by rinsing them in tap water 3 times before cooking.     Weight loss is the second thing you can do to lower your blood pressure.  Getting to and maintaining ideal weight will often normalize your blood pressure and allow you to avoid medications, entirely, cut way down on your dosage of medications, or allow to wean off your meds.  (Note, this should only be done under the supervision of your primary care doctor.)  Of course, weight loss takes time and you may need to be on medication in the meantime.  You shoot for a body mass index of 20-25.  When you go to the urgent care or to your primary care doctor, they should calculate your BMI.  If you don't know what it is, ask.  You can calculate your BMI with the following formula:  Weight in pounds x 703/ (height in inches) x (height in inches).  There are many good diets out there: Weight Watchers and the D.A.S.H. Diet are the best, but often, just modifying a few factors can be helpful:  Don't skip meals, don't eat out, and keeping a food diary.  I do not recommend  fad diets or diet pills which often raise blood pressure.    Everyone should get regular exercise, but this is particularly important for people with high blood pressure.  Just about any exercise is good.  The only exercise which may be harmful is lifting extreme heavy weights.  I recommend moderate exercise such as walking for 30 minutes 5 days a week.  Going to the gym for a 50 minute workout 3 times a week is also good.  This amounts to 150 minutes of exercise weekly.   Anyone with high blood pressure should avoid any use of tobacco.   Tobacco use does not elevate blood pressure, but it increases the risk of heart disease and stroke.  If you are interested in quitting, discuss with your doctor how to quit.  If you are not interested in quitting, ask yourself, "What would my life be like in 10 years if I continue to smoke?"  "How will I know when it is time to quit?"  "How would my life be better if I were to quit."   Excess alcohol intake can raise the blood pressure.  The safe alcohol intake is 2 drinks or less per day for men and 1 drink per day or less for women.   There is a very good diet which I recommend that has been designed for people with blood pressure called the D.A.S.H. Diet (dietary approaches to stop hypertension).  It consists of fruits, vegetables, lean meats, low fat dairy, whole grains, nuts and seeds.  It is very low in salt and sodium.  It has also been found to have other beneficial health effects such as lowering cholesterol and helping lose weight.  It has been developed by the [employer] and can be downloaded from the internet without any cost. Just do a Development worker, community on "D.A.S.H. Diet." or go the NIH website (MasterBoxes.it).  There are also cookbooks and diet plans that can be gotten from [place] to help you with this diet.   Anemia, Nonspecific Anemia is a condition in which the concentration of red blood cells or hemoglobin in the blood is below normal. Hemoglobin is a substance in red blood cells that carries oxygen to the tissues of the body. Anemia results in not enough oxygen reaching these tissues.  CAUSES  Common causes of anemia include:   Excessive bleeding. Bleeding may be internal or external. This includes excessive bleeding from periods (in women) or from the intestine.   Poor nutrition.   Chronic kidney, thyroid, and liver disease.  Bone marrow disorders that decrease red blood cell production.  Cancer and treatments for cancer.  HIV, AIDS, and their  treatments.  Spleen problems that increase red blood cell destruction.  Blood disorders.  Excess destruction of red blood cells due to infection, medicines, and autoimmune disorders. SIGNS AND SYMPTOMS   Minor weakness.   Dizziness.   Headache.  Palpitations.   Shortness of breath, especially with exercise.   Paleness.  Cold sensitivity.  Indigestion.  Nausea.  Difficulty sleeping.  Difficulty concentrating. Symptoms may occur suddenly or they may develop slowly.  DIAGNOSIS  Additional blood tests are often needed. These help your health care provider determine the best treatment. Your health care provider will check your stool for blood and look for other causes of blood loss.  TREATMENT  Treatment varies depending on the cause of the anemia. Treatment can include:   Supplements of iron, vitamin J88, or folic acid.   Hormone medicines.  A blood transfusion. This may be needed if blood loss is severe.   Hospitalization. This may be needed if there is significant continual blood loss.   Dietary changes.  Spleen removal. HOME CARE INSTRUCTIONS Keep all follow-up appointments. It often takes many weeks to correct anemia, and having your health care provider check on your condition and your response to treatment is very important. SEEK IMMEDIATE MEDICAL CARE IF:   You develop extreme weakness, shortness of breath, or chest pain.   You become dizzy or have trouble concentrating.  You develop heavy vaginal bleeding.   You develop a rash.   You have bloody or black, tarry stools.   You faint.   You vomit up blood.   You vomit repeatedly.   You have abdominal pain.  You have a fever or persistent symptoms for more than 2 3 days.   You have a fever and your symptoms suddenly get worse.   You are dehydrated.  MAKE SURE YOU:  Understand these instructions.  Will watch your condition.  Will get help right away if you are not doing  well or get worse. Document Released: 01/12/2005 Document Revised: 08/07/2013 Document Reviewed: 05/31/2013 [facility] Patient Information 2014 [employer].

## 2014-04-17 NOTE — ED Notes (Signed)
C/o bacterial discharge and medication refill  States she has a discharge Medication refill [id number] mg

## 2014-04-18 ENCOUNTER — Telehealth (HOSPITAL_COMMUNITY): Payer: Self-pay | Admitting: *Deleted

## 2014-04-18 LAB — URINE CYTOLOGY ANCILLARY ONLY
CHLAMYDIA, DNA PROBE: NEGATIVE
NEISSERIA GONORRHEA: NEGATIVE
TRICH (WINDOWPATH): NEGATIVE

## 2014-04-18 LAB — HIV ANTIBODY (ROUTINE TESTING W REFLEX): HIV 1&2 Ab, 4th Generation: NONREACTIVE

## 2014-04-18 NOTE — ED Notes (Addendum)
Pt. called on VM for her lab results. I called pt. back.  Pt. verified x 2 and given results-all neg.   Hanley Seamen Manon [place] 04/18/2014 Candida neg., Prevotella Bivia pos.  Pt. adequately treated with Flagyl.  I called pt. and left a message to call.  Call 1. 04/24/2014 I called pt.  Call 2.  Pt. verified x 2 and given results.  Pt. told she was adequately treated with Flagyl for bacterial vaginosis.  I asked pt. If she has finished the Flagyl.  She said no. I told her to finish medication and it should clear up.  04/27/2014

## 2014-04-23 LAB — URINE CYTOLOGY ANCILLARY ONLY
Bacterial vaginitis: POSITIVE — AB
Candida vaginitis: NEGATIVE

## 2014-04-24 NOTE — ED Notes (Signed)
GC/Chlamydia/Trich neg., Candida neg., Prevotella Bivia pos., HIV non-reactive.  Pt. adequately treated with Flagyl. Hanley Seamen Masoud Nyce 04/24/2014

## 2014-05-01 ENCOUNTER — Ambulatory Visit: Payer: Self-pay

## 2014-07-09 ENCOUNTER — Emergency Department (HOSPITAL_COMMUNITY)
Admission: EM | Admit: 2014-07-09 | Discharge: 2014-07-09 | Discharge status: Against Medical Advice | Payer: Self-pay | Attending: Emergency Medicine | Admitting: Emergency Medicine

## 2014-07-09 ENCOUNTER — Emergency Department (HOSPITAL_COMMUNITY): Payer: Self-pay

## 2014-07-09 ENCOUNTER — Encounter (HOSPITAL_COMMUNITY): Payer: Self-pay | Admitting: Emergency Medicine

## 2014-07-09 DIAGNOSIS — M549 Dorsalgia, unspecified: Secondary | ICD-10-CM | POA: Insufficient documentation

## 2014-07-09 DIAGNOSIS — R079 Chest pain, unspecified: Secondary | ICD-10-CM | POA: Insufficient documentation

## 2014-07-09 DIAGNOSIS — Z8742 Personal history of other diseases of the female genital tract: Secondary | ICD-10-CM | POA: Insufficient documentation

## 2014-07-09 DIAGNOSIS — Z880 Allergy status to penicillin: Secondary | ICD-10-CM | POA: Insufficient documentation

## 2014-07-09 DIAGNOSIS — E669 Obesity, unspecified: Secondary | ICD-10-CM | POA: Insufficient documentation

## 2014-07-09 DIAGNOSIS — R0602 Shortness of breath: Secondary | ICD-10-CM | POA: Insufficient documentation

## 2014-07-09 DIAGNOSIS — Z79899 Other long term (current) drug therapy: Secondary | ICD-10-CM | POA: Insufficient documentation

## 2014-07-09 DIAGNOSIS — Z3202 Encounter for pregnancy test, result negative: Secondary | ICD-10-CM | POA: Insufficient documentation

## 2014-07-09 DIAGNOSIS — I10 Essential (primary) hypertension: Secondary | ICD-10-CM | POA: Insufficient documentation

## 2014-07-09 DIAGNOSIS — R509 Fever, unspecified: Secondary | ICD-10-CM | POA: Insufficient documentation

## 2014-07-09 DIAGNOSIS — F17200 Nicotine dependence, unspecified, uncomplicated: Secondary | ICD-10-CM | POA: Insufficient documentation

## 2014-07-09 DIAGNOSIS — D649 Anemia, unspecified: Secondary | ICD-10-CM | POA: Insufficient documentation

## 2014-07-09 DIAGNOSIS — R059 Cough, unspecified: Secondary | ICD-10-CM | POA: Insufficient documentation

## 2014-07-09 DIAGNOSIS — R05 Cough: Secondary | ICD-10-CM | POA: Insufficient documentation

## 2014-07-09 LAB — URINALYSIS, ROUTINE W REFLEX MICROSCOPIC
BILIRUBIN URINE: NEGATIVE
Glucose, UA: NEGATIVE mg/dL
HGB URINE DIPSTICK: NEGATIVE
Ketones, ur: NEGATIVE mg/dL
Leukocytes, UA: NEGATIVE
Nitrite: NEGATIVE
PROTEIN: 100 mg/dL — AB
Specific Gravity, Urine: 1.019 (ref 1.005–1.030)
UROBILINOGEN UA: 1 mg/dL (ref 0.0–1.0)
pH: 6 (ref 5.0–8.0)

## 2014-07-09 LAB — CBC WITH DIFFERENTIAL/PLATELET
BASOS ABS: 0 10*3/uL (ref 0.0–0.1)
Basophils Relative: 0 % (ref 0–1)
Eosinophils Absolute: 0.1 10*3/uL (ref 0.0–0.7)
Eosinophils Relative: 2 % (ref 0–5)
HEMATOCRIT: 23.5 % — AB (ref 36.0–46.0)
Hemoglobin: 6.3 g/dL — CL (ref 12.0–15.0)
LYMPHS ABS: 1.7 10*3/uL (ref 0.7–4.0)
Lymphocytes Relative: 29 % (ref 12–46)
MCH: 17.7 pg — AB (ref 26.0–34.0)
MCHC: 26.8 g/dL — AB (ref 30.0–36.0)
MCV: 66 fL — AB (ref 78.0–100.0)
MONO ABS: 0.4 10*3/uL (ref 0.1–1.0)
MONOS PCT: 7 % (ref 3–12)
NEUTROS ABS: 3.6 10*3/uL (ref 1.7–7.7)
Neutrophils Relative %: 62 % (ref 43–77)
PLATELETS: 211 10*3/uL (ref 150–400)
RBC: 3.56 MIL/uL — ABNORMAL LOW (ref 3.87–5.11)
RDW: 19.9 % — AB (ref 11.5–15.5)
WBC: 5.8 10*3/uL (ref 4.0–10.5)

## 2014-07-09 LAB — TROPONIN I

## 2014-07-09 LAB — COMPREHENSIVE METABOLIC PANEL
ALK PHOS: 43 U/L (ref 39–117)
ALT: 8 U/L (ref 0–35)
ANION GAP: 9 (ref 5–15)
AST: 13 U/L (ref 0–37)
Albumin: 2.8 g/dL — ABNORMAL LOW (ref 3.5–5.2)
BILIRUBIN TOTAL: 0.3 mg/dL (ref 0.3–1.2)
BUN: 11 mg/dL (ref 6–23)
CO2: 26 mEq/L (ref 19–32)
CREATININE: 1.05 mg/dL (ref 0.50–1.10)
Calcium: 8.3 mg/dL — ABNORMAL LOW (ref 8.4–10.5)
Chloride: 105 mEq/L (ref 96–112)
GFR calc non Af Amer: 64 mL/min — ABNORMAL LOW (ref >90)
GFR, EST AFRICAN AMERICAN: 74 mL/min — AB (ref >90)
GLUCOSE: 90 mg/dL (ref 70–99)
POTASSIUM: 3.5 meq/L — AB (ref 3.7–5.3)
Sodium: 140 mEq/L (ref 137–147)
TOTAL PROTEIN: 7.2 g/dL (ref 6.0–8.3)

## 2014-07-09 LAB — POC URINE PREG, ED: Preg Test, Ur: NEGATIVE

## 2014-07-09 LAB — URINE MICROSCOPIC-ADD ON

## 2014-07-09 MED ORDER — METRONIDAZOLE 500 MG PO TABS: 500.0000 mg | ORAL_TABLET | Freq: Two times a day (BID) | ORAL | Status: DC

## 2014-07-09 NOTE — ED Notes (Signed)
She c/o shortness of breath with non-productive cough x 1 week.  She states at the inception of these symptoms she experienced "back cramps", which are "better now".

## 2014-07-09 NOTE — Discharge Instructions (Signed)
Anemia, Nonspecific Anemia is a condition in which the concentration of red blood cells or hemoglobin in the blood is below normal. Hemoglobin is a substance in red blood cells that carries oxygen to the tissues of the body. Anemia results in not enough oxygen reaching these tissues.  CAUSES  Common causes of anemia include:   Excessive bleeding. Bleeding may be internal or external. This includes excessive bleeding from periods (in women) or from the intestine.   Poor nutrition.   Chronic kidney, thyroid, and liver disease.  Bone marrow disorders that decrease red blood cell production.  Cancer and treatments for cancer.  HIV, AIDS, and their treatments.  Spleen problems that increase red blood cell destruction.  Blood disorders.  Excess destruction of red blood cells due to infection, medicines, and autoimmune disorders. SIGNS AND SYMPTOMS   Minor weakness.   Dizziness.   Headache.  Palpitations.   Shortness of breath, especially with exercise.   Paleness.  Cold sensitivity.  Indigestion.  Nausea.  Difficulty sleeping.  Difficulty concentrating. Symptoms may occur suddenly or they may develop slowly.  DIAGNOSIS  Additional blood tests are often needed. These help your health care provider determine the best treatment. Your health care provider will check your stool for blood and look for other causes of blood loss.  TREATMENT  Treatment varies depending on the cause of the anemia. Treatment can include:   Supplements of iron, vitamin O97, or folic acid.   Hormone medicines.   A blood transfusion. This may be needed if blood loss is severe.   Hospitalization. This may be needed if there is significant continual blood loss.   Dietary changes.  Spleen removal. HOME CARE INSTRUCTIONS Keep all follow-up appointments. It often takes many weeks to correct anemia, and having your health care provider check on your condition and your response to  treatment is very important. SEEK IMMEDIATE MEDICAL CARE IF:   You develop extreme weakness, shortness of breath, or chest pain.   You become dizzy or have trouble concentrating.  You develop heavy vaginal bleeding.   You develop a rash.   You have bloody or black, tarry stools.   You faint.   You vomit up blood.   You vomit repeatedly.   You have abdominal pain.  You have a fever or persistent symptoms for more than 2-3 days.   You have a fever and your symptoms suddenly get worse.   You are dehydrated.  MAKE SURE YOU:  Understand these instructions.  Will watch your condition.  Will get help right away if you are not doing well or get worse. Document Released: 01/12/2005 Document Revised: 08/07/2013 Document Reviewed: 05/31/2013 [facility] Patient Information 2015 [place], [place]. This information is not intended to replace advice given to you by your health care provider. Make sure you discuss any questions you have with your health care provider.  Emergency Department Resource Guide 1) Find a Doctor and Pay Out of Pocket Although you won't have to find out who is covered by your insurance plan, it is a good idea to ask around and get recommendations. You will then need to call the office and see if the doctor you have chosen will accept you as a new patient and what types of options they offer for patients who are self-pay. Some doctors offer discounts or will set up payment plans for their patients who do not have insurance, but you will need to ask so you aren't surprised when you get to your appointment.  2) Contact Your Local Health Department Not all health departments have doctors that can see patients for sick visits, but many do, so it is worth a call to see if yours does. If you don't know where your local health department is, you can check in your phone book. The CDC also has a tool to help you locate your state's health department, and many state  websites also have listings of all of their local health departments.  3) Find a [facility] If your illness is not likely to be very severe or complicated, you may want to try a walk in clinic. These are popping up all over the country in pharmacies, drugstores, and shopping centers. They're usually staffed by nurse practitioners or physician assistants that have been trained to treat common illnesses and complaints. They're usually fairly quick and inexpensive. However, if you have serious medical issues or chronic medical problems, these are probably not your best option.  No Primary Care Doctor: - Call Health Connect at  [phone] - they can help you locate a primary care doctor that  accepts your insurance, provides certain services, etc. - Physician Referral Service- [phone]  Chronic Pain Problems: Organization         Address  Phone   Notes  [facility]  [phone] Patients need to be referred by their primary care doctor.   Medication Assistance: Organization         Address  Phone   Notes  [facility] Medication [facility] [address] [place], [place] [address] [phone] --Must be a resident of [facility] -- Must have NO insurance coverage whatsoever (no Medicaid/ Medicare, etc.) -- The pt. MUST have a primary care doctor that directs their care regularly and follows them in the community   MedAssist  [phone]   [employer]  [phone]    Agencies that provide inexpensive medical care: Organization         Address  Phone   Notes  [facility]  [phone]   [facility]    [phone]   [facility] [place], [place] [address] [phone]   [place] [address], [place] [phone]   Planned Parenthood    [phone]   [facility]    [phone]   [place] and [place] [address], [place] Phone:  [phone], Fax:  [phone] Hours of Operation:  9 am - 6 pm, M-F.  Also accepts Medicaid/Medicare and self-pay.  [facility] for [place], [address], [place] Phone: [phone], Fax: [phone]. Hours of Operation:  8:30 am - 5:30 pm, M-F.  Also accepts Medicaid and self-pay.  [facility] [place] [address], [place] Phone: [phone]   [place], [place], [place] [phone], Ext. [phone] Mondays & Thursdays: 7-9 AM.  First 15 patients are seen on a first come, first serve basis.    [place] Providers:  Organization         Address  Phone   Notes  [facility] [address], [place] [phone] Also accepts self-pay patients.  [place], [place]  [phone]   [place], Suite  [place] [phone]   [place] [address], [place] [phone]   Lucianne Lei [address], [place]   [phone] Only accepts [place] Access [place] patients after they have their name applied to their card.   Self-Pay (no insurance) in [facility]:  Organization         Address  Phone   Notes  Sickle Cell Patients, [facility] [place] [phone]   [facility] Urgent Care [place] [phone]   [facility] [place]  [place], [place], [place] [phone]   [facility]/Dr. Osei-Bonsu  [address], [place] or [address], [place] [phone] Phone number for both [place] and [place] locations is the same.  [facility] [address], [place] [phone]   [facility] [address], [place] or [address] [phone]   [facility] [address] [place] [phone], phone; [phone], fax Sees patients 1st and 3rd Saturday of every month.  Must not qualify for public or private insurance (i.e. Medicaid, Medicare, [place] Health Choice, Veterans' Benefits)  Household income should be no more than 200% of the poverty level The clinic cannot treat you if you are pregnant or think you are pregnant  Sexually transmitted diseases are not treated at the clinic.    Dental Care: Organization         Address  Phone  Notes  [facility] Department of [facility] [place] [phone] Accepts children up to age 58 who are enrolled in [place] or [place]; pregnant women with a Medicaid card; and children who have applied for Medicaid or [place] Health Choice, but were declined, whose parents can pay a reduced fee at time of service.  [facility] Department of [facility]  [address], [place] [phone] Accepts children up to age 58 who are enrolled in [place] or [place]; pregnant women with a Medicaid card; and children who have applied for Medicaid or [place] Health Choice, but were declined, whose parents can pay a reduced fee at time of service.  [place] Adult Dental Access PROGRAM  [place] [phone] Patients are seen by appointment only. Walk-ins are not accepted. [place] will see patients 23 years of age and older. Monday - Tuesday (8am-5pm) Most Wednesdays (8:30-5pm) $30 per visit, cash only  [facility] Adult Dental Access PROGRAM  [address], [facility] [phone] Patients are seen by appointment only. Walk-ins are not accepted. [place] will see patients 42 years of age and older. One Wednesday Evening (Monthly: Volunteer Based).  $30 per visit, cash only  [place]  [phone] for adults; Children under age 67, call Graduate Pediatric Dentistry at [phone]. Children aged 33-14, please call [phone] to request a pediatric application.  Dental services are provided in all areas of dental care including fillings, crowns and bridges, complete and partial dentures, implants, gum treatment, root canals, and extractions. Preventive care is also provided. Treatment is provided to both adults and children. Patients are selected via a lottery and there is often a waiting list.   [facility] [address],  [place]  [phone] [email or web address]   [facility] [address] [place], [place] [phone], Ext. [phone] Second and Fourth Thursday of each month, opens at 6:30 AM; Clinic ends at 9 AM.  Patients are seen on a first-come first-served basis, and a limited number are seen during each clinic.   [facility]  [address] Hillard Danker [place], [place] [phone]   Eligibility Requirements You must have lived in [place], [place], or [place] counties for at least the last three months.   You cannot be eligible for state or federal sponsored [employer], including [employer], [place], or [employer].   You generally cannot be eligible for healthcare insurance through your employer.    How to apply: Eligibility screenings are held every Tuesday and Wednesday afternoon from 1:00 pm until 4:00 pm. You do not need an appointment for the interview!  [facility] [address], [place], [place]   [place]  [place] Department  [place]  [phone]    Behavioral Health Resources in the Community: Intensive Outpatient Programs Organization         Address  Phone  Notes  [place]. [address], [place], [place]  [phone]   [facility] [address], [place], [place]   ADS: Alcohol & Drug Svcs [address], [place], [place]   [place] [address],  [place], [place] or [phone]   Substance Abuse Resources Organization         Address  Phone  Notes  Alcohol and Drug Services  [phone]   [place]  [phone]   [facility] [place]   Chinita Pester  [phone]   Residential & Outpatient Substance Abuse Program  [phone]   Psychological Services Organization         Address  Phone  Notes  [facility]  [place]  [phone]   [place] [address], [place] or [phone]    Mobile Crisis Teams Organization         Address  Phone  Notes  [facility], Mobile Crisis Care Unit  [phone]   [facility]ive Psychotherapeutic Services  [address] [place], [place]   [place] [address], [place] [phone]    Self-Help/Support Groups Organization         Address  Phone             Notes  [place]. of [place] - variety of support groups  [place] Call for more information  Narcotics Anonymous (NA), [facility]aring [facility] [address], [employer] [place]  2 meetings at this location   Special educational needs teacher         Address  Phone  Notes  ASAP Residential Treatment [place],    [place]  [phone]   [facility]  [address], [place] [address], [place], [place]   [place], [place] [phone] Admissions: 8am-3pm M-F  Incentives Substance [place] [address]    [place], [place] [phone]   [facility] [address] [place], [place], [place]   [facility] [address],    [place], [place]  [facility]ight Programs - Intensive Outpatient [address], [place], [place]   [facility] [address]  [place], [place] [phone] or [phone]   [facility]idential Treatment Services (RTS) [address] [place], [place] Accepts Medicaid  [facility] [place] [address]  [place] [phone] Substance Abuse/Addiction Treatment   [facility] Organization         Address  Phone  Notes  [facility]  [phone]   Domenic Schwab, PhD [address] Arlis [place], [place]   [phone] or [phone]   [place], [place] [phone]   [place] [address], [place], [place] [phone] Insurance/Medicaid/sponsorship through [facility] and Families [address], [address] [place]                                    [place], [place] [phone] [place] [address], [place] [phone]    Dr. Adele Schilder  [phone]   [facility] Dept. 1) [address], [place] 2) [place] 3)  [place] [address], [place] [phone]  [phone]   [place] [phone] or [phone] (After Hours)

## 2014-07-09 NOTE — ED Provider Notes (Signed)
CSN: [id number]     Arrival date & time 07/09/14  1514 History   First MD Initiated Contact with Patient 07/09/14 1538     Chief Complaint  Patient presents with  . Shortness of Breath     (Consider location/radiation/quality/duration/timing/severity/associated sxs/prior Treatment) HPI Comments: 43 year old female history of obesity, hypertension who presents with multiple complaints. She states that 3-4 days ago she began having some cramping sensation in her mid back. She states that her symptoms lasted approximately 24-48 hours and then resolved. She then developed a cough productive of clear and yellow sputum. She has had subjective fevers. She notes chronic shortness of breath which has slightly worsened. She also notes some chest pressure but only with coughing. She denies any chest pain on exam currently. She has had some mild posttussive emesis. She denies any diarrhea. She denies any other associated symptoms. Nothing has relieved her symptoms thus far. The severity is noted to be mild. The timing of her symptoms is constant. She has not had similar symptoms previously.  Patient is a 43 y.o. female presenting with shortness of breath. The history is provided by the patient.  Shortness of Breath Associated symptoms: chest pain, cough and fever (subj)   Associated symptoms: no abdominal pain, no headaches, no neck pain and no vomiting     Past Medical History  Diagnosis Date  . Hypertension   . Uterine fibroid    Past Surgical History  Procedure Laterality Date  . Tubal ligation     Family History  Problem Relation Age of Onset  . Hypertension Mother    History  Substance Use Topics  . Smoking status: Current Some Day Smoker    Types: Cigarettes    Last Attempt to Quit: 02/23/2011  . Smokeless tobacco: Not on file  . Alcohol Use: Yes     Comment: occasionally   OB History   Grav Para Term Preterm Abortions TAB SAB Ect Mult Living                 Review of Systems   Constitutional: Positive for fever (subj). Negative for fatigue.  HENT: Negative for congestion and drooling.   Eyes: Negative for pain.  Respiratory: Positive for cough and shortness of breath.   Cardiovascular: Positive for chest pain.  Gastrointestinal: Negative for nausea, vomiting, abdominal pain and diarrhea.  Genitourinary: Negative for dysuria and hematuria.  Musculoskeletal: Positive for back pain. Negative for gait problem and neck pain.  Skin: Negative for color change.  Neurological: Negative for dizziness and headaches.  Hematological: Negative for adenopathy.  Psychiatric/Behavioral: Negative for behavioral problems.  All other systems reviewed and are negative.     Allergies  Penicillins  Home Medications   Prior to Admission medications   Medication Sig Start Date End Date Taking? Authorizing Provider  docusate sodium (COLACE) 100 MG capsule Take 1 capsule (100 mg total) by mouth 2 (two) times daily. 04/17/14  Yes Harden Mo, MD  ferrous sulfate 325 (65 FE) MG tablet Take 1 tablet (325 mg total) by mouth 3 (three) times daily with meals. 04/17/14  Yes Harden Mo, MD  hydrochlorothiazide (HYDRODIURIL) 25 MG tablet Take 1 tablet (25 mg total) by mouth daily. 04/17/14  Yes Harden Mo, MD  metroNIDAZOLE (FLAGYL) 500 MG tablet Take 1 tablet (500 mg total) by mouth 2 (two) times daily. 04/17/14   Harden Mo, MD   BP 191/97  Pulse 75  Temp(Src) 99 F (37.2 C) (Oral)  Resp 18  SpO2 98%  LMP 06/25/2014 Physical Exam  Nursing note and vitals reviewed. Constitutional: She is oriented to person, place, and time. She appears well-developed and well-nourished.  obese  HENT:  Head: Normocephalic.  Mouth/Throat: No oropharyngeal exudate.  Eyes: Conjunctivae and EOM are normal. Pupils are equal, round, and reactive to light.  Neck: Normal range of motion. Neck supple.  Cardiovascular: Normal rate, regular rhythm, normal heart sounds and intact distal pulses.   Exam reveals no gallop and no friction rub.   No murmur heard. Pulmonary/Chest: Effort normal and breath sounds normal. No respiratory distress. She has no wheezes.  Abdominal: Soft. Bowel sounds are normal. There is no tenderness. There is no rebound and no guarding.  Musculoskeletal: Normal range of motion. She exhibits no edema and no tenderness.  Neurological: She is alert and oriented to person, place, and time.  Skin: Skin is warm and dry.  Psychiatric: She has a normal mood and affect. Her behavior is normal.    ED Course  Procedures (including critical care time) Labs Review Labs Reviewed  CBC WITH DIFFERENTIAL - Abnormal; Notable for the following:    RBC 3.56 (*)    Hemoglobin 6.3 (*)    HCT 23.5 (*)    MCV 66.0 (*)    MCH 17.7 (*)    MCHC 26.8 (*)    RDW 19.9 (*)    All other components within normal limits  COMPREHENSIVE METABOLIC PANEL - Abnormal; Notable for the following:    Potassium 3.5 (*)    Calcium 8.3 (*)    Albumin 2.8 (*)    GFR calc non Af Amer 64 (*)    GFR calc Af Amer 74 (*)    All other components within normal limits  URINALYSIS, ROUTINE W REFLEX MICROSCOPIC - Abnormal; Notable for the following:    APPearance CLOUDY (*)    Protein, ur 100 (*)    All other components within normal limits  URINE MICROSCOPIC-ADD ON - Abnormal; Notable for the following:    Squamous Epithelial / LPF FEW (*)    All other components within normal limits  TROPONIN I  POC URINE PREG, ED    Imaging Review Dg Chest 2 View  07/09/2014   CLINICAL DATA:  Left-sided chest pain with cough congestion and shortness of breath for 3 days  EXAM: CHEST  2 VIEW  COMPARISON:  07/18/2009  FINDINGS: Moderate cardiac enlargement. Mild vascular congestion. No consolidation effusion or edema.  IMPRESSION: Stable chronic cardiac enlargement with mild vascular congestion. Lungs are clear.   Electronically Signed   By: Skipper Cliche M.D.   On: 07/09/2014 16:32     EKG  Interpretation   Date/Time:  Wednesday July 09 2014 16:32:42 EDT Ventricular Rate:  75 PR Interval:  189 QRS Duration: 100 QT Interval:  420 QTC Calculation: 469 R Axis:   63 Text Interpretation:  Sinus rhythm Confirmed by Trenyce Loera  MD, Johana Hopkinson  ([id number]) on 07/09/2014 4:57:13 PM      MDM   Final diagnoses:  Anemia, unspecified  SOB (shortness of breath)  Cough    4:24 PM 43 y.o. female w hx of HTN, enlarged heart (per pt), intermittent smoker, non-compliant w/ BP meds who pw cp, sob, cough. Chest pain is atypical in that it is only present with coughing. Wells/Perc neg. Doubt PE, Doubt ACS. Likely viral URI. Will get screening labs and imaging. Patient is not having pain on exam currently.  Pt found to be anemic. Likely related to vaginal bleeding  and fibroids. She does state that she has heavy vaginal bleeding each month. She states that she required a blood transfusion several years ago. She refuses a Hemoccult and rectal exam. She refuses blood. She refuses admission. She is of sound mind on my evaluation and appears to have decision-making capacity. I warned her that leaving without giving her blood or rechecking labwork could lead to undesired consequences, disability, worsening symptoms, or even death. She understands the consequences and would still like to leave. I welcomed her return at any time. She left AMA.   She stated that she was recently diagnosed with bacterial vaginosis but did not complete her prescription of Flagyl. She would like me to prescribe this again. I agreed. I asked her to also restart taking her iron supplements and she agreed to do this.  5:51 PM:  I have discussed the diagnosis/risks/treatment options with the patient and believe the pt to be eligible for discharge home to follow-up with and establish w/ a pcp. We also discussed returning to the ED immediately if new or worsening sx occur. We discussed the sx which are most concerning (e.g., worsening sob,  cp, fever) that necessitate immediate return. Medications administered to the patient during their visit and any new prescriptions provided to the patient are listed below.  Medications given during this visit Medications - No data to display  New Prescriptions   METRONIDAZOLE (FLAGYL) 500 MG TABLET    Take 1 tablet (500 mg total) by mouth 2 (two) times daily. One po bid x 7 days     Blanchard Kelch, MD 07/09/14 2147

## 2014-07-09 NOTE — Progress Notes (Signed)
[facility] provided pt with a list of primary care resources and a [facility] [place] Card application to help patient establish primary care.  °

## 2014-12-07 ENCOUNTER — Encounter (HOSPITAL_COMMUNITY): Payer: Self-pay

## 2014-12-07 ENCOUNTER — Emergency Department (HOSPITAL_COMMUNITY): Payer: Self-pay

## 2014-12-07 ENCOUNTER — Emergency Department (HOSPITAL_COMMUNITY)
Admission: EM | Admit: 2014-12-07 | Discharge: 2014-12-07 | Disposition: A | Discharge status: Home / Self Care | Payer: Self-pay | Attending: Emergency Medicine | Admitting: Emergency Medicine

## 2014-12-07 DIAGNOSIS — S8001XA Contusion of right knee, initial encounter: Secondary | ICD-10-CM | POA: Insufficient documentation

## 2014-12-07 DIAGNOSIS — Z880 Allergy status to penicillin: Secondary | ICD-10-CM | POA: Insufficient documentation

## 2014-12-07 DIAGNOSIS — Y9389 Activity, other specified: Secondary | ICD-10-CM | POA: Insufficient documentation

## 2014-12-07 DIAGNOSIS — I10 Essential (primary) hypertension: Secondary | ICD-10-CM | POA: Insufficient documentation

## 2014-12-07 DIAGNOSIS — R319 Hematuria, unspecified: Secondary | ICD-10-CM | POA: Insufficient documentation

## 2014-12-07 DIAGNOSIS — M25561 Pain in right knee: Secondary | ICD-10-CM

## 2014-12-07 DIAGNOSIS — M1711 Unilateral primary osteoarthritis, right knee: Secondary | ICD-10-CM | POA: Insufficient documentation

## 2014-12-07 DIAGNOSIS — Y9289 Other specified places as the place of occurrence of the external cause: Secondary | ICD-10-CM | POA: Insufficient documentation

## 2014-12-07 DIAGNOSIS — Z720 Tobacco use: Secondary | ICD-10-CM | POA: Insufficient documentation

## 2014-12-07 DIAGNOSIS — W1839XA Other fall on same level, initial encounter: Secondary | ICD-10-CM | POA: Insufficient documentation

## 2014-12-07 DIAGNOSIS — Z792 Long term (current) use of antibiotics: Secondary | ICD-10-CM | POA: Insufficient documentation

## 2014-12-07 DIAGNOSIS — Z3202 Encounter for pregnancy test, result negative: Secondary | ICD-10-CM | POA: Insufficient documentation

## 2014-12-07 DIAGNOSIS — Z79899 Other long term (current) drug therapy: Secondary | ICD-10-CM | POA: Insufficient documentation

## 2014-12-07 DIAGNOSIS — Y998 Other external cause status: Secondary | ICD-10-CM | POA: Insufficient documentation

## 2014-12-07 DIAGNOSIS — Z8742 Personal history of other diseases of the female genital tract: Secondary | ICD-10-CM | POA: Insufficient documentation

## 2014-12-07 LAB — URINALYSIS, ROUTINE W REFLEX MICROSCOPIC
Bilirubin Urine: NEGATIVE
GLUCOSE, UA: NEGATIVE mg/dL
Hgb urine dipstick: NEGATIVE
KETONES UR: NEGATIVE mg/dL
LEUKOCYTES UA: NEGATIVE
Nitrite: NEGATIVE
PH: 5.5 (ref 5.0–8.0)
Protein, ur: 100 mg/dL — AB
SPECIFIC GRAVITY, URINE: 1.017 (ref 1.005–1.030)
Urobilinogen, UA: 0.2 mg/dL (ref 0.0–1.0)

## 2014-12-07 LAB — URINE MICROSCOPIC-ADD ON

## 2014-12-07 LAB — POC URINE PREG, ED: Preg Test, Ur: NEGATIVE

## 2014-12-07 MED ORDER — OXYCODONE-ACETAMINOPHEN 5-325 MG PO TABS
1.0000 | ORAL_TABLET | Freq: Once | ORAL | Status: AC
  Administered 2014-12-07: 1 via ORAL
  Filled 2014-12-07: qty 1

## 2014-12-07 MED ORDER — MELOXICAM 7.5 MG PO TABS: 7.5000 mg | ORAL_TABLET | Freq: Every day | ORAL | Status: DC

## 2014-12-07 MED ORDER — OXYCODONE-ACETAMINOPHEN 5-325 MG PO TABS: 1.0000 | ORAL_TABLET | Freq: Four times a day (QID) | ORAL | Status: DC | PRN

## 2014-12-07 MED ORDER — METRONIDAZOLE 500 MG PO TABS: 500.0000 mg | ORAL_TABLET | Freq: Two times a day (BID) | ORAL | Status: DC

## 2014-12-07 NOTE — Discharge Instructions (Signed)
Wear knee compression wrap as needed for comfort. Ice or heat and elevate knee throughout the day to help with pain. Use mobic as directed, and use percocet as needed for breakthrough pain relief. Do not drive or operate machinery with pain medication use. Follow up with [facility] for your regular medical care. Call orthopedic follow up today or tomorrow to schedule followup appointment for recheck of ongoing knee pain and further care for your knee pain. Return to the ER for changes or worsening symptoms.    Knee Pain Knee pain can be a result of an injury or other medical conditions. Treatment will depend on the cause of your pain. HOME CARE  Only take medicine as told by your doctor.  Keep a healthy weight. Being overweight can make the knee hurt more.  Stretch before exercising or playing sports.  If there is constant knee pain, change the way you exercise. Ask your doctor for advice.  Make sure shoes fit well. Choose the right shoe for the sport or activity.  Protect your knees. Wear kneepads if needed.  Rest when you are tired. GET HELP RIGHT AWAY IF:   Your knee pain does not stop.  Your knee pain does not get better.  Your knee joint feels hot to the touch.  You have a fever. MAKE SURE YOU:   Understand these instructions.  Will watch this condition.  Will get help right away if you are not doing well or get worse. Document Released: 03/03/2009 Document Revised: 02/27/2012 Document Reviewed: 03/03/2009 [facility] Patient Information 2015 [place], [place]. This information is not intended to replace advice given to you by your health care provider. Make sure you discuss any questions you have with your health care provider. Arthritis, Nonspecific Arthritis is pain, redness, warmth, or puffiness (inflammation) of a joint. The joint may be stiff or hurt when you move it. One or more joints may be affected. There are many types of arthritis. Your doctor may not  know what type you have right away. The most common cause of arthritis is wear and tear on the joint (osteoarthritis). HOME CARE   Only take medicine as told by your doctor.  Rest the joint as much as possible.  Raise (elevate) your joint if it is puffy.  Use crutches if the painful joint is in your leg.  Drink enough fluids to keep your pee (urine) clear or pale yellow.  Follow your doctor's diet instructions.  Use cold packs for very bad joint pain for 10 to 15 minutes every hour. Ask your doctor if it is okay for you to use hot packs.  Exercise as told by your doctor.  Take a warm shower if you have stiffness in the morning.  Move your sore joints throughout the day. GET HELP RIGHT AWAY IF:   You have a fever.  You have very bad joint pain, puffiness, or redness.  You have many joints that are painful and puffy.  You are not getting better with treatment.  You have very bad back pain or leg weakness.  You cannot control when you poop (bowel movement) or pee (urinate).  You do not feel better in 24 hours or are getting worse.  You are having side effects from your medicine. MAKE SURE YOU:   Understand these instructions.  Will watch your condition.  Will get help right away if you are not doing well or get worse. Document Released: 03/01/2010 Document Revised: 06/05/2012 Document Reviewed: 03/01/2010 [employer] Patient  Information 2015 [place], [place]. This information is not intended to replace advice given to you by your health care provider. Make sure you discuss any questions you have with your health care provider.  Cryotherapy Cryotherapy is when you put ice on your injury. Ice helps lessen pain and puffiness (swelling) after an injury. Ice works the best when you start using it in the first 24 to 48 hours after an injury. HOME CARE  Put a dry or damp towel between the ice pack and your skin.  You may press gently on the ice pack.  Leave the ice on for  no more than 10 to 20 minutes at a time.  Check your skin after 5 minutes to make sure your skin is okay.  Rest at least 20 minutes between ice pack uses.  Stop using ice when your skin loses feeling (numbness).  Do not use ice on someone who cannot tell you when it hurts. This includes small children and people with memory problems (dementia). GET HELP RIGHT AWAY IF:  You have white spots on your skin.  Your skin turns blue or pale.  Your skin feels waxy or hard.  Your puffiness gets worse. MAKE SURE YOU:   Understand these instructions.  Will watch your condition.  Will get help right away if you are not doing well or get worse. Document Released: 05/23/2008 Document Revised: 02/27/2012 Document Reviewed: 07/28/2011 [facility] Patient Information 2015 [place], [place]. This information is not intended to replace advice given to you by your health care provider. Make sure you discuss any questions you have with your health care provider.

## 2014-12-07 NOTE — ED Notes (Signed)
Per pt, having right knee pain since yesterday.  Has had some chest pain also.  Denies chest pain right now

## 2014-12-07 NOTE — ED Provider Notes (Signed)
CSN: [id number]     Arrival date & time 12/07/14  1036 History   First MD Initiated Contact with Patient 12/07/14 1041     No chief complaint on file.    (Consider location/radiation/quality/duration/timing/severity/associated sxs/prior Treatment) HPI Comments: Vickie Patel is a 43 y.o. female with a PMHx of HTN and uterine fibroids s/p BTL, who presents to the ED with complaints of R knee pain. Patient reports that she fell 6 months ago, but never came to the doctor for evaluation and did not have ongoing pain and tell Saturday when she began to have 10/10 aching constant right knee pain that radiates down her foot, exacerbated by walking and bending the knee, and unrelieved with Advil. She states that she cannot walk on it because it is so painful. In triage she reported that she had chest tightness, but upon history taking, she states that she had tightness in her chest this morning which was intermittent and is now resolved, and reports that she "is not here for chest pain today, (she) wants her right knee looked at". She endorses associated swelling in her right knee, without erythema or warmth. She denies fevers, chills, ongoing chest pain/shortness of breath, cough, abdominal pain, nausea, vomiting, diarrhea, constipation, dysuria, vaginal bleeding, vaginal discharge, numbness, tingling, or weakness. She denies history of gout. She denies any lower extremity swelling aside from within the knee. No recent travel or estrogen use. She reports malodorous darker urine for the last week, and states she needs Flagyl for this because she did not finish her prior course of Flagyl given to her recently.  Patient is a 43 y.o. female presenting with knee pain. The history is provided by the patient. No language interpreter was used.  Knee Pain Location:  Knee Time since incident:  1 day Lower extremity injury: 6 months ago.   Knee location:  R knee Pain details:    Quality:  Aching   Radiates to:  R  leg   Severity:  Severe (10/10)   Onset quality:  Gradual   Duration:  1 day   Timing:  Constant   Progression:  Unchanged Chronicity:  New Dislocation: no   Prior injury to area:  Yes Relieved by:  Nothing Worsened by:  Bearing weight, flexion and extension Ineffective treatments:  NSAIDs Associated symptoms: decreased ROM (due to pain), stiffness and swelling   Associated symptoms: no back pain, no fever, no muscle weakness, no neck pain, no numbness and no tingling   Risk factors: obesity     Past Medical History  Diagnosis Date  . Hypertension   . Uterine fibroid    Past Surgical History  Procedure Laterality Date  . Tubal ligation     Family History  Problem Relation Age of Onset  . Hypertension Mother    History  Substance Use Topics  . Smoking status: Current Some Day Smoker    Types: Cigarettes    Last Attempt to Quit: 02/23/2011  . Smokeless tobacco: Not on file  . Alcohol Use: Yes     Comment: occasionally   OB History    No data available     Review of Systems  Constitutional: Negative for fever and chills.  Respiratory: Positive for chest tightness (intermittent, now resolved). Negative for shortness of breath and wheezing.   Cardiovascular: Negative for chest pain, palpitations and leg swelling.  Gastrointestinal: Negative for nausea, vomiting, abdominal pain, diarrhea and constipation.  Genitourinary: Positive for hematuria (and malodorous). Negative for dysuria, urgency, frequency, vaginal  bleeding and vaginal discharge.  Musculoskeletal: Positive for joint swelling (L knee), arthralgias (L knee), gait problem (difficulty) and stiffness. Negative for myalgias, back pain and neck pain.  Skin: Negative for color change.  Allergic/Immunologic: Negative for immunocompromised state.  Neurological: Negative for weakness, numbness and headaches.  Psychiatric/Behavioral: Negative for confusion.   10 Systems reviewed and are negative for acute change  except as noted in the HPI.    Allergies  Penicillins  Home Medications   Prior to Admission medications   Medication Sig Start Date End Date Taking? Authorizing Provider  docusate sodium (COLACE) 100 MG capsule Take 1 capsule (100 mg total) by mouth 2 (two) times daily. 04/17/14   Harden Mo, MD  ferrous sulfate 325 (65 FE) MG tablet Take 1 tablet (325 mg total) by mouth 3 (three) times daily with meals. 04/17/14   Harden Mo, MD  hydrochlorothiazide (HYDRODIURIL) 25 MG tablet Take 1 tablet (25 mg total) by mouth daily. 04/17/14   Harden Mo, MD  metroNIDAZOLE (FLAGYL) 500 MG tablet Take 1 tablet (500 mg total) by mouth 2 (two) times daily. 04/17/14   Harden Mo, MD  metroNIDAZOLE (FLAGYL) 500 MG tablet Take 1 tablet (500 mg total) by mouth 2 (two) times daily. One po bid x 7 days 07/09/14   Pamella Pert, MD   BP 124/89 mmHg  Pulse 69  Resp 25  SpO2 100% Physical Exam  Constitutional: She is oriented to person, place, and time. Vital signs are normal. She appears well-developed and well-nourished.  Non-toxic appearance. No distress.  HENT:  Head: Normocephalic and atraumatic.  Mouth/Throat: Oropharynx is clear and moist and mucous membranes are normal.  Eyes: Conjunctivae and EOM are normal. Right eye exhibits no discharge. Left eye exhibits no discharge.  Neck: Normal range of motion. Neck supple.  Cardiovascular: Normal rate, regular rhythm, normal heart sounds and intact distal pulses.  Exam reveals no gallop and no friction rub.   No murmur heard. Pulmonary/Chest: Effort normal and breath sounds normal. No respiratory distress. She has no decreased breath sounds. She has no wheezes. She has no rhonchi. She has no rales.  Abdominal: Soft. Normal appearance and bowel sounds are normal. She exhibits no distension. There is no tenderness. There is no rigidity, no rebound, no guarding, no tenderness at McBurney's point and negative Murphy's sign.  Obese abdomen which  limits exam. Soft, nonTTP, nondistended, +BS throughout, no r/g/r, neg murphy's, neg mcburney's  Musculoskeletal:       Right knee: She exhibits decreased range of motion (due to pain). She exhibits no swelling, no effusion, no deformity, normal alignment, no LCL laxity, normal patellar mobility and no MCL laxity. Tenderness found. Medial joint line and lateral joint line tenderness noted.       Legs: R knee with limited ROM due to pain, no appreciable swelling or effusion although body habitus limits exam. No deformity or abnl alignment, no varus/valgus laxity, no abnormal patellar mobility or ballottement, diffuse jointline tenderness throughout, no specific focus. Unable to perform mcmurray's due to habitus and pain. Refuses to ambulate. Distal pulses intact in all extremities, strength 5/5 in all extremities, sensation grossly intact in all extremities.   Neurological: She is alert and oriented to person, place, and time. She has normal strength. No sensory deficit.  Skin: Skin is warm, dry and intact. No rash noted.  No erythema or warmth to RLE  Psychiatric: She has a normal mood and affect.  Nursing note and vitals reviewed.  ED Course  Procedures (including critical care time) Labs Review Labs Reviewed  URINALYSIS, ROUTINE W REFLEX MICROSCOPIC - Abnormal; Notable for the following:    Protein, ur 100 (*)    All other components within normal limits  URINE MICROSCOPIC-ADD ON - Abnormal; Notable for the following:    Squamous Epithelial / LPF FEW (*)    All other components within normal limits  POC URINE PREG, ED    Imaging Review Dg Knee Complete 4 Views Right  12/07/2014   CLINICAL DATA:  Two day history of right knee pain. No history of trauma  EXAM: RIGHT KNEE - COMPLETE 4+ VIEW  COMPARISON:  None.  FINDINGS: Frontal, lateral, and bilateral oblique views were obtained. No fracture or dislocation. There is a moderate joint effusion. There is moderate generalized joint space  narrowing with spurring in all compartments. No erosive change.  IMPRESSION: Moderate generalized osteoarthritic change. Moderate joint effusion. No fracture or dislocation.   Electronically Signed   By: Lowella Grip M.D.   On: 12/07/2014 11:41     EKG Interpretation   Date/Time:  Sunday December 07 2014 10:46:52 EST Ventricular Rate:  70 PR Interval:  184 QRS Duration: 95 QT Interval:  425 QTC Calculation: 459 R Axis:   80 Text Interpretation:  Sinus rhythm Borderline T wave abnormalities similar  to previous Confirmed by ZAVITZ  MD, JOSHUA ([id number]) on 12/07/2014 10:48:50  AM      MDM   Final diagnoses:  Right knee pain  Osteoarthritis of right knee, unspecified osteoarthritis type    43 y.o. female with R knee pain, I suspect arthritis but pt insists on xray. No injury recently, 81mos ago had fall and bruised it, but never saw a doctor. Pt initially complained to triage of chest tightness and SOB that occurred last night, but when I evaluated pt she said "that's not what I'm here for, that's gone, it's just my knee". She refused labs, stated she didn't need her heart evaluated because she wasn't having chest pain, she was here for her knee. Pt given percocet for pain, refused to get IV or shot. Will obtain U/A due to c/o malodorous urine, and knee imaging, then plan for d/c.  2:12 PM Pain improved. Xray showing moderate OA of R knee with some effusion. U/A negative but pt insists that she needs flagyl. Agreed to give flagyl today. Discussed RICE therapy and outpt f/up with [facility] for medical care, and ortho f/up as needed for future concerns. Will rx mobic and percocet as needed for pain. Continues to deny CP, states she's fine and doesn't need labs. Doubt DVT/PE or other concerning etiology, and pt refusing labs therefore will not proceed with CP work up. I explained the diagnosis and have given explicit precautions to return to the ER including for any other new  or worsening symptoms. The patient understands and accepts the medical plan as it's been dictated and I have answered their questions. Discharge instructions concerning home care and prescriptions have been given. The patient is STABLE and is discharged to home in good condition.  BP 124/89 mmHg  Pulse 69  Temp(Src) 98 F (36.7 C) (Oral)  Resp 25  SpO2 100%  LMP 11/30/2014  Meds ordered this encounter  Medications  . oxyCODONE-acetaminophen (PERCOCET/ROXICET) 5-325 MG per tablet 1 tablet    Sig:   . ibuprofen (ADVIL,MOTRIN) 600 MG tablet    Sig: Take 600 mg by mouth every 6 (six) hours as needed for moderate  pain.  . metroNIDAZOLE (FLAGYL) 500 MG tablet    Sig: Take 1 tablet (500 mg total) by mouth 2 (two) times daily. One po bid x 7 days    Dispense:  14 tablet    Refill:  0    Order Specific Question:  Supervising Provider    Answer:  Noemi Chapel D [[id number]]  . meloxicam (MOBIC) 7.5 MG tablet    Sig: Take 1 tablet (7.5 mg total) by mouth daily.    Dispense:  30 tablet    Refill:  0    Order Specific Question:  Supervising Provider    Answer:  Noemi Chapel D [[id number]]  . oxyCODONE-acetaminophen (PERCOCET) 5-325 MG per tablet    Sig: Take 1 tablet by mouth every 6 (six) hours as needed for severe pain.    Dispense:  10 tablet    Refill:  0    Order Specific Question:  Supervising Provider    Answer:  Johnna Acosta [address] [place], PA-C 12/07/14 1421  Mariea Clonts, MD 12/07/14 [phone]

## 2014-12-07 NOTE — ED Notes (Signed)
Patient states that she cannot give sample at this time.

## 2015-02-03 ENCOUNTER — Emergency Department (HOSPITAL_COMMUNITY)
Admission: EM | Admit: 2015-02-03 | Discharge: 2015-02-03 | Discharge status: Against Medical Advice | Payer: Self-pay | Attending: Emergency Medicine | Admitting: Emergency Medicine

## 2015-02-03 ENCOUNTER — Encounter (HOSPITAL_COMMUNITY): Payer: Self-pay

## 2015-02-03 ENCOUNTER — Ambulatory Visit (HOSPITAL_COMMUNITY): Admission: RE | Admit: 2015-02-03 | Payer: Self-pay | Source: Ambulatory Visit

## 2015-02-03 ENCOUNTER — Emergency Department (HOSPITAL_COMMUNITY): Payer: Self-pay

## 2015-02-03 DIAGNOSIS — R0602 Shortness of breath: Secondary | ICD-10-CM | POA: Insufficient documentation

## 2015-02-03 DIAGNOSIS — I10 Essential (primary) hypertension: Secondary | ICD-10-CM | POA: Insufficient documentation

## 2015-02-03 DIAGNOSIS — R079 Chest pain, unspecified: Secondary | ICD-10-CM | POA: Insufficient documentation

## 2015-02-03 DIAGNOSIS — Z720 Tobacco use: Secondary | ICD-10-CM | POA: Insufficient documentation

## 2015-02-03 NOTE — ED Notes (Signed)
Pt left to go home and lay down, she states that she couldn't wait that long and we couldn't test her for mold

## 2015-02-03 NOTE — ED Notes (Signed)
Pt complains of chest pains and short of breath for one week, she thinks she has been exposed to mold

## 2015-02-21 ENCOUNTER — Encounter (HOSPITAL_COMMUNITY): Payer: Self-pay | Admitting: Emergency Medicine

## 2015-02-21 ENCOUNTER — Emergency Department (INDEPENDENT_AMBULATORY_CARE_PROVIDER_SITE_OTHER)
Admission: EM | Admit: 2015-02-21 | Discharge: 2015-02-21 | Disposition: A | Discharge status: Home / Self Care | Payer: Self-pay | Source: Home / Self Care | Attending: Family Medicine | Admitting: Family Medicine

## 2015-02-21 ENCOUNTER — Encounter (HOSPITAL_COMMUNITY): Payer: Self-pay | Admitting: *Deleted

## 2015-02-21 ENCOUNTER — Emergency Department (INDEPENDENT_AMBULATORY_CARE_PROVIDER_SITE_OTHER): Payer: Self-pay

## 2015-02-21 ENCOUNTER — Emergency Department (HOSPITAL_COMMUNITY)
Admission: EM | Admit: 2015-02-21 | Discharge: 2015-02-21 | Disposition: A | Discharge status: Home / Self Care | Payer: Self-pay | Attending: Emergency Medicine | Admitting: Emergency Medicine

## 2015-02-21 DIAGNOSIS — J069 Acute upper respiratory infection, unspecified: Secondary | ICD-10-CM | POA: Insufficient documentation

## 2015-02-21 DIAGNOSIS — I10 Essential (primary) hypertension: Secondary | ICD-10-CM

## 2015-02-21 DIAGNOSIS — Z791 Long term (current) use of non-steroidal anti-inflammatories (NSAID): Secondary | ICD-10-CM | POA: Insufficient documentation

## 2015-02-21 DIAGNOSIS — Z720 Tobacco use: Secondary | ICD-10-CM | POA: Insufficient documentation

## 2015-02-21 DIAGNOSIS — Z880 Allergy status to penicillin: Secondary | ICD-10-CM | POA: Insufficient documentation

## 2015-02-21 DIAGNOSIS — Z792 Long term (current) use of antibiotics: Secondary | ICD-10-CM | POA: Insufficient documentation

## 2015-02-21 DIAGNOSIS — Z8542 Personal history of malignant neoplasm of other parts of uterus: Secondary | ICD-10-CM | POA: Insufficient documentation

## 2015-02-21 DIAGNOSIS — Z79899 Other long term (current) drug therapy: Secondary | ICD-10-CM | POA: Insufficient documentation

## 2015-02-21 HISTORY — DX: Cardiomegaly: I51.7

## 2015-02-21 MED ORDER — ALBUTEROL SULFATE HFA 108 (90 BASE) MCG/ACT IN AERS: 2.0000 | INHALATION_SPRAY | RESPIRATORY_TRACT | Status: DC | PRN

## 2015-02-21 MED ORDER — HYDROCHLOROTHIAZIDE 25 MG PO TABS: 25.0000 mg | ORAL_TABLET | Freq: Every day | ORAL | Status: DC

## 2015-02-21 MED ORDER — HYDROCHLOROTHIAZIDE 25 MG PO TABS
25.0000 mg | ORAL_TABLET | Freq: Once | ORAL | Status: AC
  Administered 2015-02-21: 25 mg via ORAL
  Filled 2015-02-21: qty 1

## 2015-02-21 NOTE — Discharge Instructions (Signed)
See your doctor for allergy testing or referral if further problems

## 2015-02-21 NOTE — ED Notes (Signed)
Pt concerned about her care she's receiving from the MD point. Asked to speak to someone in charge. Informed pt this RN would speak with the charge RN and see if she could come in and speak to the patient and her family member.

## 2015-02-21 NOTE — ED Notes (Signed)
Pt states that she has been coughing for 2 weeks and along with congestion. Pt states that she lives with her mother and believes that there is mold in her mothers home and she believes her issues started when she started living with her mother.

## 2015-02-21 NOTE — ED Provider Notes (Addendum)
CSN: [id number]     Arrival date & time 02/21/15  0906 History   First MD Initiated Contact with Patient 02/21/15 [phone]     Chief Complaint  Patient presents with  . Cough  . Shortness of Breath   (Consider location/radiation/quality/duration/timing/severity/associated sxs/prior Treatment) Patient is a 44 y.o. female presenting with cough. The history is provided by the patient.  Cough Cough characteristics:  Non-productive, dry and harsh Severity:  Moderate Onset quality:  Gradual Duration:  2 weeks Progression:  Unchanged Chronicity:  New Smoker: yes   Context: upper respiratory infection   Relieved by:  None tried Worsened by:  Nothing tried Ineffective treatments:  None tried Associated symptoms: shortness of breath and wheezing     Past Medical History  Diagnosis Date  . Hypertension   . Uterine fibroid    Past Surgical History  Procedure Laterality Date  . Tubal ligation     Family History  Problem Relation Age of Onset  . Hypertension Mother    History  Substance Use Topics  . Smoking status: Current Some Day Smoker    Types: Cigarettes    Last Attempt to Quit: 02/23/2011  . Smokeless tobacco: Not on file  . Alcohol Use: Yes     Comment: occasionally   OB History    No data available     Review of Systems  Constitutional: Negative.   HENT: Negative.   Respiratory: Positive for cough, shortness of breath and wheezing.   Cardiovascular: Negative.   Gastrointestinal: Negative.     Allergies  Penicillins  Home Medications   Prior to Admission medications   Medication Sig Start Date End Date Taking? Authorizing Provider  docusate sodium (COLACE) 100 MG capsule Take 1 capsule (100 mg total) by mouth 2 (two) times daily. Patient not taking: Reported on 12/07/2014 04/17/14   Harden Mo, MD  ferrous sulfate 325 (65 FE) MG tablet Take 1 tablet (325 mg total) by mouth 3 (three) times daily with meals. Patient taking differently: Take 325 mg by mouth 2  (two) times daily with a meal.  04/17/14   Harden Mo, MD  hydrochlorothiazide (HYDRODIURIL) 25 MG tablet Take 1 tablet (25 mg total) by mouth daily. 02/21/15   Billy Fischer, MD  ibuprofen (ADVIL,MOTRIN) 600 MG tablet Take 600 mg by mouth every 6 (six) hours as needed for moderate pain.    Historical Provider, MD  meloxicam (MOBIC) 7.5 MG tablet Take 1 tablet (7.5 mg total) by mouth daily. 12/07/14   Mercedes Strupp Camprubi-S[place], PA-C  metroNIDAZOLE (FLAGYL) 500 MG tablet Take 1 tablet (500 mg total) by mouth 2 (two) times daily. Patient not taking: Reported on 12/07/2014 04/17/14   Harden Mo, MD  metroNIDAZOLE (FLAGYL) 500 MG tablet Take 1 tablet (500 mg total) by mouth 2 (two) times daily. One po bid x 7 days Patient not taking: Reported on 12/07/2014 07/09/14   Pamella Pert, MD  metroNIDAZOLE (FLAGYL) 500 MG tablet Take 1 tablet (500 mg total) by mouth 2 (two) times daily. One po bid x 7 days 12/07/14   Patty Sermons Camprubi-Soms, PA-C  oxyCODONE-acetaminophen (PERCOCET) 5-325 MG per tablet Take 1 tablet by mouth every 6 (six) hours as needed for severe pain. 12/07/14   Mercedes Strupp Camprubi-Soms, PA-C   BP 200/101 mmHg  Pulse 90  Temp(Src) 97.7 F (36.5 C) (Oral)  Resp 24  SpO2 100%  LMP 02/03/2015 Physical Exam  Constitutional: She is oriented to person, place, and time. She appears well-developed  and well-nourished. No distress.  HENT:  Head: Normocephalic.  Right Ear: External ear normal.  Left Ear: External ear normal.  Mouth/Throat: Oropharynx is clear and moist.  Neck: Normal range of motion. Neck supple.  Cardiovascular: Regular rhythm, normal heart sounds and intact distal pulses.   Pulmonary/Chest: Effort normal. She has wheezes. She has no rales.  Neurological: She is alert and oriented to person, place, and time.  Skin: Skin is warm and dry.  Nursing note and vitals reviewed.   ED Course  Procedures (including critical care time) Labs Review Labs  Reviewed - No data to display  Imaging Revi. X-rays reviewed and report per radiologist.   MDM   1. URI (upper respiratory infection)   2. Essential hypertension    After initial eval pt voiced concerns about mold and testing for it, pt advosed we did not do that and pt became agitated.    Billy Fischer, MD 02/21/15 [place], MD 02/21/15 1104  Billy Fischer, MD 02/21/15 [phone]

## 2015-02-21 NOTE — Discharge Instructions (Signed)
Cough, Adult ° A cough is a reflex. It helps you clear your throat and airways. A cough can help heal your body. A cough can last 2 or 3 weeks (acute) or may last more than 8 weeks (chronic). Some common causes of a cough can include an infection, allergy, or a cold. °HOME CARE °· Only take medicine as told by your doctor. °· If given, take your medicines (antibiotics) as told. Finish them even if you start to feel better. °· Use a cold steam vaporizer or humidifier in your home. This can help loosen thick spit (secretions). °· Sleep so you are almost sitting up (semi-upright). Use pillows to do this. This helps reduce coughing. °· Rest as needed. °· Stop smoking if you smoke. °GET HELP RIGHT AWAY IF: °· You have yellowish-white fluid (pus) in your thick spit. °· Your cough gets worse. °· Your medicine does not reduce coughing, and you are losing sleep. °· You cough up blood. °· You have trouble breathing. °· Your pain gets worse and medicine does not help. °· You have a fever. °MAKE SURE YOU:  °· Understand these instructions. °· Will watch your condition. °· Will get help right away if you are not doing well or get worse. °Document Released: 08/18/2011 Document Revised: 04/21/2014 Document Reviewed: 08/18/2011 °ExitCare® Patient Information ©2015 [employer]. This information is not intended to replace advice given to you by your health care provider. Make sure you discuss any questions you have with your health care provider. ° °Viral Infections °A viral infection can be caused by different types of viruses. Most viral infections are not serious and resolve on their own. However, some infections may cause severe symptoms and may lead to further complications. °SYMPTOMS °Viruses can frequently cause: °· Minor sore throat. °· Aches and pains. °· Headaches. °· Runny nose. °· Different types of rashes. °· Watery eyes. °· Tiredness. °· Cough. °· Loss of appetite. °· Gastrointestinal infections, resulting in nausea,  vomiting, and diarrhea. °These symptoms do not respond to antibiotics because the infection is not caused by bacteria. However, you might catch a bacterial infection following the viral infection. This is sometimes called a "superinfection." Symptoms of such a bacterial infection may include: °· Worsening sore throat with pus and difficulty swallowing. °· Swollen neck glands. °· Chills and a high or persistent fever. °· Severe headache. °· Tenderness over the sinuses. °· Persistent overall ill feeling (malaise), muscle aches, and tiredness (fatigue). °· Persistent cough. °· Yellow, green, or brown mucus production with coughing. °HOME CARE INSTRUCTIONS  °· Only take over-the-counter or prescription medicines for pain, discomfort, diarrhea, or fever as directed by your caregiver. °· Drink enough water and fluids to keep your urine clear or pale yellow. Sports drinks can provide valuable electrolytes, sugars, and hydration. °· Get plenty of rest and maintain proper nutrition. Soups and broths with crackers or rice are fine. °SEEK IMMEDIATE MEDICAL CARE IF:  °· You have severe headaches, shortness of breath, chest pain, neck pain, or an unusual rash. °· You have uncontrolled vomiting, diarrhea, or you are unable to keep down fluids. °· You or your child has an oral temperature above 102° F (38.9° C), not controlled by medicine. °· Your baby is older than 3 months with a rectal temperature of 102° F (38.9° C) or higher. °· Your baby is 3 months old or younger with a rectal temperature of 100.4° F (38° C) or higher. °MAKE SURE YOU:  °· Understand these instructions. °· Will watch your condition. °·   Will get help right away if you are not doing well or get worse. °Document Released: 09/14/2005 Document Revised: 02/27/2012 Document Reviewed: 04/11/2011 °ExitCare® Patient Information ©2015 [employer]. This information is not intended to replace advice given to you by your health care provider. Make sure you discuss any  questions you have with your health care provider. ° °

## 2015-02-21 NOTE — ED Notes (Signed)
Pt would not sign the discharge e signature d/t the physician.

## 2015-02-21 NOTE — ED Notes (Signed)
Pt reports going to [facility]cc today for URI, was not given antibiotic but was given prescription for bp meds. Has been out of bp meds x 6 months and is hypertensive today. Reports productive cough with yellow sputum. Airway intact.

## 2015-02-21 NOTE — ED Provider Notes (Signed)
CSN: [id number]     Arrival date & time 02/21/15  1425 History   First MD Initiated Contact with Patient 02/21/15 1530     Chief Complaint  Patient presents with  . Hypertension  . URI     (Consider location/radiation/quality/duration/timing/severity/associated sxs/prior Treatment) HPI Comments: Patient presents to the ER for evaluation of a cough. Patient was just seen at [facility] urgent care center today. During her evaluation she was noted to have hypertension. She had an x-ray performed. Patient was discharged with treatment for her high blood pressure and symptomatically treatment for her cough. Patient promptly into the ER immediately. When I counted her in the room she claimed that nothing was done for her at urgent care and that she needed antibiotics.  Patient is a 44 y.o. female presenting with hypertension and URI.  Hypertension  URI Presenting symptoms: cough     Past Medical History  Diagnosis Date  . Hypertension   . Uterine fibroid    Past Surgical History  Procedure Laterality Date  . Tubal ligation     Family History  Problem Relation Age of Onset  . Hypertension Mother    History  Substance Use Topics  . Smoking status: Current Some Day Smoker    Types: Cigarettes    Last Attempt to Quit: 02/23/2011  . Smokeless tobacco: Not on file  . Alcohol Use: Yes     Comment: occasionally   OB History    No data available     Review of Systems  Respiratory: Positive for cough.   All other systems reviewed and are negative.     Allergies  Penicillins  Home Medications   Prior to Admission medications   Medication Sig Start Date End Date Taking? Authorizing Provider  docusate sodium (COLACE) 100 MG capsule Take 1 capsule (100 mg total) by mouth 2 (two) times daily. Patient not taking: Reported on 12/07/2014 04/17/14   Harden Mo, MD  ferrous sulfate 325 (65 FE) MG tablet Take 1 tablet (325 mg total) by mouth 3 (three) times daily with  meals. Patient taking differently: Take 325 mg by mouth 2 (two) times daily with a meal.  04/17/14   Harden Mo, MD  hydrochlorothiazide (HYDRODIURIL) 25 MG tablet Take 1 tablet (25 mg total) by mouth daily. 02/21/15   Billy Fischer, MD  ibuprofen (ADVIL,MOTRIN) 600 MG tablet Take 600 mg by mouth every 6 (six) hours as needed for moderate pain.    Historical Provider, MD  meloxicam (MOBIC) 7.5 MG tablet Take 1 tablet (7.5 mg total) by mouth daily. 12/07/14   Mercedes Strupp Camprubi-Soms, PA-C  metroNIDAZOLE (FLAGYL) 500 MG tablet Take 1 tablet (500 mg total) by mouth 2 (two) times daily. Patient not taking: Reported on 12/07/2014 04/17/14   Harden Mo, MD  metroNIDAZOLE (FLAGYL) 500 MG tablet Take 1 tablet (500 mg total) by mouth 2 (two) times daily. One po bid x 7 days Patient not taking: Reported on 12/07/2014 07/09/14   Pamella Pert, MD  metroNIDAZOLE (FLAGYL) 500 MG tablet Take 1 tablet (500 mg total) by mouth 2 (two) times daily. One po bid x 7 days 12/07/14   Patty Sermons Camprubi-Soms, PA-C  oxyCODONE-acetaminophen (PERCOCET) 5-325 MG per tablet Take 1 tablet by mouth every 6 (six) hours as needed for severe pain. 12/07/14   Mercedes Strupp Camprubi-Soms, PA-C   BP 185/95 mmHg  Pulse 72  Temp(Src) 98.2 F (36.8 C) (Oral)  Resp 20  SpO2 100%  LMP 02/03/2015  Physical Exam  Constitutional: She is oriented to person, place, and time. She appears well-developed and well-nourished. No distress.  HENT:  Head: Normocephalic and atraumatic.  Right Ear: Hearing normal.  Left Ear: Hearing normal.  Nose: Nose normal.  Mouth/Throat: Oropharynx is clear and moist and mucous membranes are normal.  Eyes: Conjunctivae and EOM are normal. Pupils are equal, round, and reactive to light.  Neck: Normal range of motion. Neck supple.  Cardiovascular: Regular rhythm, S1 normal and S2 normal.  Exam reveals no gallop and no friction rub.   No murmur heard. Pulmonary/Chest: Effort normal and  breath sounds normal. No respiratory distress. She exhibits no tenderness.  Abdominal: Soft. Normal appearance and bowel sounds are normal. There is no hepatosplenomegaly. There is no tenderness. There is no rebound, no guarding, no tenderness at McBurney's point and negative Murphy's sign. No hernia.  Musculoskeletal: Normal range of motion.  Neurological: She is alert and oriented to person, place, and time. She has normal strength. No cranial nerve deficit or sensory deficit. Coordination normal. GCS eye subscore is 4. GCS verbal subscore is 5. GCS motor subscore is 6.  Skin: Skin is warm, dry and intact. No rash noted. No cyanosis.  Psychiatric: She has a normal mood and affect. Her speech is normal and behavior is normal. Thought content normal.  Nursing note and vitals reviewed.   ED Course  Procedures (including critical care time) Labs Review Labs Reviewed - No data to display  Imaging Review Dg Chest 2 View  02/21/2015   CLINICAL DATA:  Cough and shortness of breath.  EXAM: CHEST - 2 VIEW  COMPARISON:  07/09/2014  FINDINGS: Stable cardiac enlargement. Mild atelectasis present at the right lung base. There is no evidence of pulmonary edema, consolidation, pneumothorax, nodule or pleural fluid. The bony thorax is unremarkable.  IMPRESSION: Stable cardiac enlargement.  No overt edema or infiltrate.   Electronically Signed   By: Aletta Edouard M.D.   On: 02/21/2015 10:52     EKG Interpretation None      MDM   Final diagnoses:  None   viral upper respiratory infection  Patient presents to the ER for evaluation of cough. Patient was seen at urgent care earlier today. Record was reviewed. She was appropriately treated for high blood pressure. She had an x-ray which did not show any evidence of bacterial infection. Patient checked into the ER immediately after she was discharged from urgent care because she felt that she needed antibiotics. I attempted to explain to her that she had no  bacterial infection seen on her x-ray and that her upper respiratory infection is a virus and does not need antibiotics. She proceeded to inform me that "everybody knows that when you have an upper respiratory infection needed antibiotics". I told her that I would treat her symptomatically and appropriately, at which point she claimed that I was not giving her antibiotics because I was a racist and this was "a race thing". I promptly left the room after this comment.    Orpah Greek, MD 02/21/15 [phone]

## 2015-02-21 NOTE — ED Notes (Signed)
Pt refused to sign that we discussed all discharge information including prescriptions.

## 2015-07-17 ENCOUNTER — Ambulatory Visit: Payer: Self-pay | Admitting: Cardiovascular Disease

## 2015-08-05 DIAGNOSIS — I119 Hypertensive heart disease without heart failure: Secondary | ICD-10-CM | POA: Diagnosis not present

## 2015-08-05 DIAGNOSIS — I10 Essential (primary) hypertension: Secondary | ICD-10-CM | POA: Diagnosis not present

## 2015-08-06 ENCOUNTER — Ambulatory Visit (INDEPENDENT_AMBULATORY_CARE_PROVIDER_SITE_OTHER): Payer: PRIVATE HEALTH INSURANCE | Admitting: Cardiovascular Disease

## 2015-08-06 ENCOUNTER — Encounter: Payer: Self-pay | Admitting: Cardiovascular Disease

## 2015-08-06 VITALS — BP 138/86 | HR 70 | Ht 65.5 in | Wt 342.1 lb

## 2015-08-06 DIAGNOSIS — R011 Cardiac murmur, unspecified: Secondary | ICD-10-CM

## 2015-08-06 DIAGNOSIS — I517 Cardiomegaly: Secondary | ICD-10-CM | POA: Diagnosis not present

## 2015-08-06 DIAGNOSIS — I10 Essential (primary) hypertension: Secondary | ICD-10-CM | POA: Diagnosis not present

## 2015-08-06 DIAGNOSIS — Z79899 Other long term (current) drug therapy: Secondary | ICD-10-CM

## 2015-08-06 DIAGNOSIS — R0609 Other forms of dyspnea: Secondary | ICD-10-CM

## 2015-08-06 DIAGNOSIS — I119 Hypertensive heart disease without heart failure: Secondary | ICD-10-CM

## 2015-08-06 MED ORDER — LOSARTAN POTASSIUM-HCTZ 50-12.5 MG PO TABS: 1.0000 | ORAL_TABLET | Freq: Every day | ORAL | Status: DC

## 2015-08-06 NOTE — Patient Instructions (Addendum)
Your physician has requested that you have an echocardiogram. Echocardiography is a painless test that uses sound waves to create images of your heart. It provides your doctor with information about the size and shape of your heart and how well your heart's chambers and valves are working. This procedure takes approximately one hour. There are no restrictions for this procedure.  Your physician recommends that you return for lab work in: fasting.  Your physician has recommended you make the following change in your medication:   1.) stop the hydrochlorothiazide  2.) start new prescription for losartan-hct 50/12.5     This has already been sent to your pharmacy .  Your physician recommends that you schedule a follow-up appointment in: 6-8 weeks pending tests results.

## 2015-08-06 NOTE — Progress Notes (Signed)
Patient ID: Vickie Patel, female   DOB: 1971/11/26, 44 y.o.   MRN: [id number]     PATIENT PROFILE: Vickie ERKER is a 44 y.o. female  who presents to establish cardiology care and to evaluate a history of an enlarged heart.   HPI:  Vickie Patel minutes to a history of hypertension for which she has been taking hydrochlorothiazide 25 mg daily.  She has been told of having an enlarged heart.  An overview of hospital records reveals that she had a chest x-ray in July 2015, which was interpreted as "stable cardiac enlargement."  She has had issues with labile blood pressure and on several evaluations.  Her blood pressures have been noted to be 180 and even 409 systolic during periods of increased stress and anxiety.  Still long-standing history of obesity.  When she was initially married 15 years ago she weighed approximately 300 pounds.  She states her maximum weight had risen to 380.  She is caring for her mother.  She is concerned about her enlarged heart.  She admits to shortness of breath with some activity, but she is not very active.  She denies any chest pressure.  She is unaware of any palpitations.  She denies daytime sleepiness.  She does snore.  She denies waking up gasping for breath.  She presents for cardiology evaluation.   Past Medical History  Diagnosis Date  . Hypertension   . Uterine fibroid   . Enlarged heart     Past Surgical History  Procedure Laterality Date  . Tubal ligation      Allergies  Allergen Reactions  . Penicillins Hives    Current Outpatient Prescriptions  Medication Sig Dispense Refill  . albuterol (PROVENTIL HFA;VENTOLIN HFA) 108 (90 BASE) MCG/ACT inhaler Inhale 2 puffs into the lungs every 4 (four) hours as needed for wheezing or shortness of breath. 1 Inhaler 0  . docusate sodium (COLACE) 100 MG capsule Take 1 capsule (100 mg total) by mouth 2 (two) times daily. 60 capsule 5  . ferrous sulfate 325 (65 FE) MG tablet Take 1 tablet (325 mg total)  by mouth 3 (three) times daily with meals. (Patient taking differently: Take 325 mg by mouth 2 (two) times daily with a meal. ) 90 tablet 5  . ibuprofen (ADVIL,MOTRIN) 600 MG tablet Take 600 mg by mouth every 6 (six) hours as needed for moderate pain.    . meloxicam (MOBIC) 7.5 MG tablet Take 1 tablet (7.5 mg total) by mouth daily. 30 tablet 0  . losartan-hydrochlorothiazide (HYZAAR) 50-12.5 MG per tablet Take 1 tablet by mouth daily. 30 tablet 6   No current facility-administered medications for this visit.    Social History   Social History  . Marital Status: Single    Spouse Name: N/A  . Number of Children: N/A  . Years of Education: N/A   Occupational History  . Not on file.   Social History Main Topics  . Smoking status: Current Some Day Smoker    Types: Cigarettes  . Smokeless tobacco: Never Used     Comment: 0.5 packs per week  . Alcohol Use: 0.0 oz/week    0 Standard drinks or equivalent per week     Comment: 1 pint per week  . Drug Use: Yes  . Sexual Activity: Yes    Birth Control/ Protection: Surgical   Other Topics Concern  . Not on file   Social History Narrative    Family History  Problem Relation Age  of Onset  . Hypertension Mother     ROS General: Negative; No fevers, chills, or night sweats HEENT: Negative; No changes in vision or hearing, sinus congestion, difficulty swallowing Pulmonary: Negative; No cough, wheezing, shortness of breath, hemoptysis Cardiovascular:  See HPI; No chest pain, presyncope, syncope, palpitations, edema GI: Negative; No nausea, vomiting, diarrhea, or abdominal pain GU: Negative; No dysuria, hematuria, or difficulty voiding Musculoskeletal: Negative; no myalgias, joint pain, or weakness Hematologic/Oncologic: Negative; no easy bruising, bleeding Endocrine: Negative; no heat/cold intolerance; no diabetes Neuro: Negative; no changes in balance, headaches Skin: Negative; No rashes or skin lesions Psychiatric: Negative; No  behavioral problems, depression Sleep: Positive for snoring.  She wakes up approximately 3 times per night.  No daytime sleepiness, hypersomnolence, bruxism, restless legs, hypnogagnic hallucinations Other comprehensive 14 point system review is negative   Physical Exam BP 138/86 mmHg  Pulse 70  Ht 5' 5.5" (1.664 m)  Wt 342 lb 1.6 oz (155.176 kg)  BMI 56.04 kg/m2  Super morbid obesity Repeat blood pressure by me 155/86 Wt Readings from Last 3 Encounters:  08/06/15 342 lb 1.6 oz (155.176 kg)   General: Alert, oriented, no distress.  Skin: normal turgor, no rashes, warm and dry HEENT: Normocephalic, atraumatic. Pupils equal round and reactive to light; sclera anicteric; extraocular muscles intact; Fundi without hemorrhages or exudates Nose without nasal septal hypertrophy Mouth/Parynx benign; Mallinpatti scale 3 Neck: No JVD, no carotid bruits; normal carotid upstroke Lungs: clear to ausculatation and percussion; no wheezing or rales Chest wall: without tenderness to palpitation Heart: PMI not displaced, RRR, s1 s2 normal, 2-9/5 short systolic murmur, no diastolic murmur, no rubs, gallops, thrills, or heaves Abdomen: soft, nontender; no hepatosplenomehaly, BS+; abdominal aorta nontender and not dilated by palpation. Back: no CVA tenderness Pulses 2+ Musculoskeletal: full range of motion, normal strength, no joint deformities Extremities: no clubbing cyanosis or edema, Homan's sign negative  Neurologic: grossly nonfocal; Cranial nerves grossly wnl Psychologic: Normal mood and affect   ECG (independently read by me): Normal sinus rhythm at 70 bpm.  Nonspecific ST changes.  QTc interval 466 ms. LABS: BMP Latest Ref Rng 07/09/2014 04/17/2014 10/29/2012  Glucose 70 - 99 mg/dL 90 93 94  BUN 6 - 23 mg/dL _0 Creatinine 0.50 - 1.10 mg/dL 1.05 1.20(H) 1.00  Sodium 137 - 147 mEq/L 140 142 141  Potassium 3.7 - 5.3 mEq/L 3.5(L) 4.0 3.9  Chloride 96 - 112 mEq/L 105 103 105  CO2 19 - 32  mEq/L 26 - -  Calcium 8.4 - 10.5 mg/dL 8.3(L) - -     Hepatic Function Latest Ref Rng 07/09/2014  Total Protein 6.0 - 8.3 g/dL 7.2  Albumin 3.5 - 5.2 g/dL 2.8(L)  AST 0 - 37 U/L 13  ALT 0 - 35 U/L 8  Alk Phosphatase 39 - 117 U/L 43  Total Bilirubin 0.3 - 1.2 mg/dL 0.3    CBC Latest Ref Rng 07/09/2014 04/17/2014 10/29/2012  WBC 4.0 - 10.5 K/uL 5.8 - -  Hemoglobin 12.0 - 15.0 g/dL 6.3(LL) 7.8(L) 8.8(L)  Hematocrit 36.0 - 46.0 % 23.5(L) 23.0(L) 26.0(L)  Platelets 150 - 400 K/uL 211 - -   Lab Results  Component Value Date   MCV 66.0* 07/09/2014   No results found for: TSH  BNP No results found for: BNP  ProBNP No results found for: PROBNP   Lipid Panel  No results found for: CHOL, TRIG, HDL, CHOLHDL, VLDL, LDLCALC, LDLDIRECT  RADIOLOGY: No results found.   ASSESSMENT AND  PLAN: Ms. Azoria Abbett is a 44 year old African-American female who has a history of hypertension, and uterine fibroids.  She is only been treated with HCTZ 25 mg for blood pressure.  Review of several of her emergency room evaluations have revealed blood pressures that have been documented to be 200 on one occasion and in March 2016 at 185/95.  Review of laboratory from July 2015 has revealed microcytic anemia.  She has not had recent blood work drawn.  Her blood pressure today on recheck by me was 155/86.  I have recommended she discontinue her hydrochlorothiazide 25 mg and in its place I will start her on losartan HCT 50/12.5.  She had a normal serum creatinine one year ago.  I am recommending laboratory be obtained in the fasting state consisting of a CMP, lipid panel, CBC, iron studies, TSH, and lipid studies.  She has a short systolic murmur in the aortic region.  Previous chest x-rays have suggested stable cardiomegaly.  I'm scheduling her for 2-D echo Doppler study to further evaluate her cardiac size and function as well as wall thickness and valvular architecture.  We discussed her weight.  She is super  morbidly obese with a body mass index of 56.04 kg/m.  We discussed dietary adjustment.  In addition to activity and exercise.  I will contact her regarding the results of blood work appeared I'll see her back in the office in approximate 6 weeks for follow-up evaluation.   Troy Sine, MD, [facility] 08/06/2015 4:13 PM

## 2015-08-12 ENCOUNTER — Telehealth: Payer: Self-pay

## 2015-08-12 NOTE — Telephone Encounter (Signed)
Patient Demographics     Patient Name Sex DOB SSN Address Phone    Vickie Patel, Vickie Patel Female 18-May-1971 [id number] [place] [address] [phone] (Home)       RE: [facility]   Due: Today  Received: Today    Ashley Akin           I have spoken with Le[facility] [place] and I am faxing the office note regarding the necessity of the ECHO. The state office has been notified of the name change for several vendors that they contract with. That is a work in progress.       Previous Messages     ----- Message -----   From: Elease Etienne   Sent: 08/12/2015  9:30 AM    To: Renato Shin  Subject: [facility]        Good morning ,this is Lubertha Basque with Pre [employer] at [address] . I was going over [place] when I noticed that this patient is coming in for a echo. It looks like the allowed amount is only 500.00 dollars. I see where you have corresponded with the vocational rehabilitation services I was wondering could you give them a call to let them know that the echo + the reading will be 2100(per Rhonda in billing and also the [employer] purchase Authorization form has [facility] ,it should have [facility] thank you for help in this matter. Contact person [place] or Cristela Blue [phone]. My number is [phone] thanks again

## 2015-08-13 ENCOUNTER — Other Ambulatory Visit (HOSPITAL_COMMUNITY): Payer: Self-pay

## 2015-09-01 ENCOUNTER — Telehealth (HOSPITAL_COMMUNITY): Payer: Self-pay | Admitting: *Deleted

## 2015-09-21 ENCOUNTER — Emergency Department (HOSPITAL_COMMUNITY)
Admission: EM | Admit: 2015-09-21 | Discharge: 2015-09-21 | Disposition: A | Discharge status: Home / Self Care | Payer: Self-pay

## 2015-09-21 NOTE — ED Notes (Signed)
Have called pt x 3 in lobby with no answer.

## 2015-10-02 ENCOUNTER — Encounter (HOSPITAL_COMMUNITY): Payer: Self-pay | Admitting: *Deleted

## 2015-10-02 ENCOUNTER — Inpatient Hospital Stay (HOSPITAL_COMMUNITY)
Admission: AD | Admit: 2015-10-02 | Discharge: 2015-10-03 | Disposition: A | Discharge status: Home / Self Care | Payer: Self-pay | Source: Ambulatory Visit | Attending: Obstetrics and Gynecology | Admitting: Obstetrics and Gynecology

## 2015-10-02 DIAGNOSIS — N920 Excessive and frequent menstruation with regular cycle: Secondary | ICD-10-CM | POA: Insufficient documentation

## 2015-10-02 DIAGNOSIS — I517 Cardiomegaly: Secondary | ICD-10-CM | POA: Insufficient documentation

## 2015-10-02 DIAGNOSIS — Z880 Allergy status to penicillin: Secondary | ICD-10-CM | POA: Insufficient documentation

## 2015-10-02 DIAGNOSIS — N898 Other specified noninflammatory disorders of vagina: Secondary | ICD-10-CM | POA: Insufficient documentation

## 2015-10-02 DIAGNOSIS — D649 Anemia, unspecified: Secondary | ICD-10-CM | POA: Insufficient documentation

## 2015-10-02 DIAGNOSIS — D259 Leiomyoma of uterus, unspecified: Secondary | ICD-10-CM | POA: Insufficient documentation

## 2015-10-02 DIAGNOSIS — F17200 Nicotine dependence, unspecified, uncomplicated: Secondary | ICD-10-CM | POA: Insufficient documentation

## 2015-10-02 LAB — URINALYSIS, ROUTINE W REFLEX MICROSCOPIC
Bilirubin Urine: NEGATIVE
GLUCOSE, UA: NEGATIVE mg/dL
Hgb urine dipstick: NEGATIVE
KETONES UR: NEGATIVE mg/dL
LEUKOCYTES UA: NEGATIVE
NITRITE: NEGATIVE
PROTEIN: NEGATIVE mg/dL
Specific Gravity, Urine: 1.02 (ref 1.005–1.030)
UROBILINOGEN UA: 0.2 mg/dL (ref 0.0–1.0)
pH: 6 (ref 5.0–8.0)

## 2015-10-02 LAB — POCT PREGNANCY, URINE: PREG TEST UR: NEGATIVE

## 2015-10-02 NOTE — MAU Provider Note (Signed)
History     CSN: [id number]  Arrival date and time: 10/02/15 2002   First Provider Initiated Contact with Patient 10/02/15 2338      Chief Complaint  Patient presents with  . Vaginitis   HPI This is a 44 y.o. female who presents with complaint of "BV".  States has had it before and knows that is what it is. States has vaginal discharge with odor.  States has had a cough and bad smell to breath "and that's how I know I have BV".  Denies other complaints, "just want the BV treated".  Initially refused pelvic exam, stating she knows "you can diagnose it by my urine having protein in it".    States is in the process of getting insurance to get her other medical problems treated. Has "enlarged heart" and a "large fibroid with heavy periods". Last Hgb was 6.3  Is supposed to be taking iron but does not  Like it.  OB History    Gravida Para Term Preterm AB TAB SAB Ectopic Multiple Living   4 4        4       Past Medical History  Diagnosis Date  . Hypertension   . Uterine fibroid   . Enlarged heart     Past Surgical History  Procedure Laterality Date  . Tubal ligation      Family History  Problem Relation Age of Onset  . Hypertension Mother     Social History  Substance Use Topics  . Smoking status: Current Some Day Smoker    Types: Cigarettes  . Smokeless tobacco: Never Used     Comment: 0.5 packs per week  . Alcohol Use: 0.0 oz/week    0 Standard drinks or equivalent per week     Comment: 1 pint per week    Allergies:  Allergies  Allergen Reactions  . Penicillins Hives    Prescriptions prior to admission  Medication Sig Dispense Refill Last Dose  . albuterol (PROVENTIL HFA;VENTOLIN HFA) 108 (90 BASE) MCG/ACT inhaler Inhale 2 puffs into the lungs every 4 (four) hours as needed for wheezing or shortness of breath. 1 Inhaler 0 Taking  . docusate sodium (COLACE) 100 MG capsule Take 1 capsule (100 mg total) by mouth 2 (two) times daily. 60 capsule 5 Taking  .  ferrous sulfate 325 (65 FE) MG tablet Take 1 tablet (325 mg total) by mouth 3 (three) times daily with meals. (Patient taking differently: Take 325 mg by mouth 2 (two) times daily with a meal. ) 90 tablet 5 Taking  . ibuprofen (ADVIL,MOTRIN) 600 MG tablet Take 600 mg by mouth every 6 (six) hours as needed for moderate pain.   Taking  . losartan-hydrochlorothiazide (HYZAAR) 50-12.5 MG per tablet Take 1 tablet by mouth daily. 30 tablet 6   . meloxicam (MOBIC) 7.5 MG tablet Take 1 tablet (7.5 mg total) by mouth daily. 30 tablet 0 Taking   Medical, Surgical, Family and Social histories reviewed and are listed above.  Medications and allergies reviewed.   Review of Systems  Constitutional: Negative for fever, chills and malaise/fatigue.       Bad smell to breath   Respiratory: Positive for cough. Negative for shortness of breath and wheezing.   Gastrointestinal: Negative for nausea, vomiting, abdominal pain, diarrhea and constipation.  Musculoskeletal: Negative for back pain.  Neurological: Negative for dizziness and weakness.   Physical Exam   Temperature 98.1 F (36.7 C), resp. rate 18, height 5\' 5"  (1.651 m), weight  156.582 kg (345 lb 3.2 oz), last menstrual period 09/08/2015.  Physical Exam  Constitutional: She is oriented to person, place, and time. She appears well-developed and well-nourished. No distress.  HENT:  Head: Normocephalic.  Cardiovascular: Normal rate and regular rhythm.   Respiratory: Effort normal. No respiratory distress. She has no wheezes. She has no rales.  GI: Soft. She exhibits no distension. There is no tenderness. There is no rebound and no guarding.  Genitourinary: Vaginal discharge (thick white discharge, wet prep obtained) found.  Musculoskeletal: Normal range of motion.  Neurological: She is alert and oriented to person, place, and time.  Skin: Skin is warm.  Psychiatric: She has a normal mood and affect.    MAU Course  Procedures  MDM Results for  orders placed or performed during the hospital encounter of 10/02/15 (from the past 24 hour(s))  Urinalysis, Routine w reflex microscopic (not at [facility])     Status: None   Collection Time: 10/02/15  8:31 PM  Result Value Ref Range   Color, Urine YELLOW YELLOW   APPearance CLEAR CLEAR   Specific Gravity, Urine 1.020 1.005 - 1.030   pH 6.0 5.0 - 8.0   Glucose, UA NEGATIVE NEGATIVE mg/dL   Hgb urine dipstick NEGATIVE NEGATIVE   Bilirubin Urine NEGATIVE NEGATIVE   Ketones, ur NEGATIVE NEGATIVE mg/dL   Protein, ur NEGATIVE NEGATIVE mg/dL   Urobilinogen, UA 0.2 0.0 - 1.0 mg/dL   Nitrite NEGATIVE NEGATIVE   Leukocytes, UA NEGATIVE NEGATIVE  Pregnancy, urine POC     Status: None   Collection Time: 10/02/15  8:50 PM  Result Value Ref Range   Preg Test, Ur NEGATIVE NEGATIVE  Wet prep, genital     Status: None   Collection Time: 10/02/15 11:50 PM  Result Value Ref Range   Yeast Wet Prep HPF POC NONE SEEN NONE SEEN   Trich, Wet Prep NONE SEEN NONE SEEN   Clue Cells Wet Prep HPF POC NONE SEEN NONE SEEN   WBC, Wet Prep HPF POC NONE SEEN NONE SEEN     Assessment and Plan  A:  Vaginal discharge, physiologic       No evidence of yeast or BV infection       Hx large fibroids and menorrhagia resulting in anemia       Cardiac enlargement  P:  Discussed negative results       Long and detailed discussion of nature of BV, written info given on BV       No need for antibiotics       Will send email to see if we can get her into clinic downstairs.     [facility] 10/02/2015, 11:55 PM

## 2015-10-02 NOTE — MAU Note (Signed)
Here for vaginal check up. I think i may have BV. Was having fishy smelling d/c but it went away after I sat in a tube with capfull of bleach in it. Have HTN but do not have fld pills. No health ins but working on getting eligible by my income for help with meds. Have enlarged heart as well.

## 2015-10-02 NOTE — MAU Note (Signed)
Pt did not want to undress stated someone told her we could dx a yeast infection with just urine test. Notified provider and M.Jimmye Norman talked to pt and explained we would need a vaginal swab to  Dx any infection in her vaginal area because most of the time it does not show up in urine. Pt agreed to exam.

## 2015-10-03 DIAGNOSIS — N898 Other specified noninflammatory disorders of vagina: Secondary | ICD-10-CM

## 2015-10-03 LAB — WET PREP, GENITAL
CLUE CELLS WET PREP: NONE SEEN
TRICH WET PREP: NONE SEEN
WBC WET PREP: NONE SEEN
YEAST WET PREP: NONE SEEN

## 2015-10-03 NOTE — Discharge Instructions (Signed)
Patient education: Bacterial vaginosis (Beyond the Basics)  Author: Estill Batten, MD Section Editor: Bonnita Levan, MD Deputy Editor: Lawson Fiscal, MD, [facility] Disclosures All topics are updated as new evidence becomes available and our peer review process is complete.  Literature review current through: Sep 2016.   This topic last updated: May 27, 2014.   INTRODUCTION -- Bacterial vaginosis (BV) is the most common cause of vaginal discharge in women. It can cause bothersome symptoms, and also increases the risk of acquiring serious sexually transmitted infections, such as HIV. It may be difficult to know if discharge is caused by BV or other common vaginal infections, thus a visit with a healthcare provider is recommended in most cases. (See "Patient education: Vaginal discharge in adult women (Beyond the Basics)".)  BACTERIAL VAGINOSIS CAUSES -- BV occurs when there is a change in the number and types of bacteria in the vagina. Lactobacilli are a type of bacteria that are normally found in the vagina. In women with BV, the number of lactobacilli is reduced. The reason for these changes is not known. Risk factors -- Risk factors for BV include multiple or new sexual partners, douching, and cigarette smoking. BV is now thought to be a sexually transmitted infection, although most recurrences are not sexually related.  BACTERIAL VAGINOSIS SIGNS AND SYMPTOMS -- Approximately 50 to 43 percent of women with BV have no symptoms. Those with symptoms often note an unpleasant, "fishy smelling" vaginal discharge that is more noticeable after sexual intercourse. Vaginal discharge that is off-white and thin may also be present. Some patients have itching. Pain during urination or sex, redness, and swelling are not typical. If you have concerns about excessive or foul-smelling vaginal discharge, abnormal bleeding, or vulvar irritation, see a healthcare provider. Self-treatment with  over-the-counter products (eg, yeast creams, deodorants) is not recommended without a definite diagnosis.  BACTERIAL VAGINOSIS DIAGNOSIS -- The diagnosis of BV is based upon a physical examination and laboratory testing. The physical examination usually includes a pelvic examination, which allows the healthcare provider to observe and test vaginal secretions. It can be difficult to know, without an examination and testing, if vaginal discharged is caused by BV or another vaginal infection. You should insist that your provider confirm the diagnosis with appropriate tests.  BACTERIAL VAGINOSIS COMPLICATIONS -- BV itself is not harmful, although it has been associated with some health problems. ?Pregnant women with BV are at higher risk of preterm delivery (see 'Bacterial vaginosis and pregnancy' below). ?Untreated BV in a woman who undergoes hysterectomy or abortion can lead to infection of the surgical site. ?BV increases the risk of becoming infected with and spreading HIV. ?BV increases the risk that a woman will become infected with genital herpes, gonorrhea, or chlamydia. (See "Patient education: Genital herpes (Beyond the Basics)" and "Patient education: Gonorrhea (Beyond the Basics)" and "Patient education: Chlamydia (Beyond the Basics)".)  BACTERIAL VAGINOSIS TREATMENT -- Treatment of BV is usually recommended. There are two prescription medications used for the treatment of BV: metronidazole and clindamycin. Both medications can be taken in pill form by mouth, or with a gel or cream that is inserted inside the vagina. Oral medication may be more convenient, but causes more side effects. If symptoms improve after treatment, a follow up visit is not necessary. Metronidazole -- Metronidazole vaginal gel is one of the most effective treatments; it is applied inside the vagina at bedtime for five days. Metronidazole can also be taken in pill form, 500 mg twice daily for  seven days. The choice of pill  versus vaginal gel depends upon the woman's preference. In general, there are fewer side effects with the vaginal treatment. Side effects of oral metronidazole include a metallic taste, nausea, and a temporary lowered blood count. You should not drink alcohol while taking metronidazole pills due to the risk of a serious interaction, which can cause flushing, nausea, thirst, palpitations, chest pain, vertigo, and low blood pressure. Metronidazole pills also interact with warfarin (Coumadin), potentially increasing the risk of bleeding. The vaginal gel does not cause these side effects. Clindamycin -- Clindamycin is a cream that is inserted into the vagina at bedtime for seven days. A one-day vaginal clindamycin cream and three day vaginal ovule are also available. Clindamycin cream should not be used with latex condoms due to the risk of condom breakage. Clindamycin can also be taken by mouth, 300 mg twice daily for seven days. Sexual partners -- Treating the sexual partner does not improve the woman's symptoms or decrease the risk of the infection coming back, hence treatment of female sexual partners is not recommended. Relapse and recurrent infection -- Approximately 30 percent of women who initially improve after treatment have a recurrence of BV symptoms within three months, and more than 50 percent have a recurrence of symptoms within 12 months. It is not clear why this occurs, although it may be related to bacteria that were not completely treated or lack of a normal level of protective lactobacilli. The role of lactobacilli is discussed above (see 'Bacterial vaginosis causes' above). Relapse can be treated with a prolonged course of oral or vaginal metronidazole or clindamycin for seven days; the [employer] for Disease Control and Prevention suggests a treatment regimen different from the initial or previous treatment regimen (eg, oral treatment if vaginal treatment used previously). If you've  had more than three episodes of BV in the past 12 months, you may benefit from longer treatment. This may include vaginal metronidazole gel twice weekly for three to six months. Clindamycin (oral or vaginal) is not usually recommended as a preventive treatment. Probiotic therapy is of no value in preventing recurrence of BV. Bacterial vaginosis and pregnancy -- Pregnant women with BV are at increased risk of preterm birth. However, there is no benefit to testing and/or treating all pregnant women for BV unless the woman has symptoms of infection. Some experts recommend testing only pregnant women who have a history of a previous preterm delivery. Pregnant women with symptoms of BV infection are usually treated. Oral treatment with seven days of metronidazole is preferred over vaginal treatments.  BACTERIAL VAGINOSIS PREVENTION -- The best way to prevent BV is not known. However, a few basic recommendations can be made. ?Do not douche. Douching is the use of a solution to rinse the inside of the vagina. Some women douche to feel "clean," although there is no proven benefit of douching. The vagina is normally able to maintain a healthy balance of bacteria; douching can upset this balance and potentially flush harmful bacteria into the upper genital tracts (uterus, fallopian tubes). ?Limit the number of sexual partners. Women with multiple sexual partners are at higher risk of developing bacterial vaginosis and sexually transmitted infections. ?Finish the entire course of treatment for BV, even if the symptoms resolve after a few doses. ?Use of birth control pills may be helpful; however, use of condoms is advised for female partners of women with recurrent BV.  SUMMARY ?Bacterial vaginosis (BV) can cause "fishy smelling" vaginal discharge, which may  be worse after sex. Some women do not have this discharge. ?BV is considered by some experts to be a sexually transmitted infection. Sexual partners do not need  to be treated since treatment of males is not effective for preventing infection of the female partner. Some experts recommend that female partners use condoms. Female partners should be treated with standard therapy. ?Do not treat yourself for abnormal vaginal discharge. A doctor or nurse should first perform an exam to determine the reason for the discharge. ?Several prescription medications are available to treat BV; some are vaginal gels or creams while others are pills that you take by mouth. Pills may be more convenient but usually cause side effects (nausea, metallic taste). ?Some women develop BV repeatedly. A treatment may be recommended to prevent infections. This includes a vaginal gel twice per week for three to six months. ?Pregnant women with BV infection should be treated. This usually includes pills that are taken by mouth  WHERE TO GET MORE INFORMATION -- Your healthcare provider is the best source of information for questions and concerns related to your medical problem. This article will be updated as needed on our web site ([email or web address]). Related topics for patients, as well as selected articles written for healthcare professionals, are also available. Some of the most relevant are listed below.

## 2015-10-23 ENCOUNTER — Ambulatory Visit: Payer: Self-pay | Admitting: Cardiovascular Disease

## 2015-11-04 ENCOUNTER — Encounter: Payer: Self-pay | Admitting: Obstetrics & Gynecology

## 2015-11-10 ENCOUNTER — Telehealth: Payer: Self-pay

## 2015-11-10 NOTE — Telephone Encounter (Signed)
Dr Claiborne Billings ordered blood work and an echocardiogram at last office visit (08/06/15). Patient no showed echo appointment and blood work has not been completed.  Attempted to contact patient. Phone rang and rang then disconnected.

## 2015-11-19 ENCOUNTER — Encounter: Payer: Self-pay | Admitting: Cardiovascular Disease

## 2015-11-19 ENCOUNTER — Ambulatory Visit (INDEPENDENT_AMBULATORY_CARE_PROVIDER_SITE_OTHER): Payer: Self-pay | Admitting: Cardiovascular Disease

## 2015-11-19 VITALS — BP 172/92 | HR 70 | Ht 65.0 in | Wt 341.0 lb

## 2015-11-19 DIAGNOSIS — I119 Hypertensive heart disease without heart failure: Secondary | ICD-10-CM

## 2015-11-19 DIAGNOSIS — R011 Cardiac murmur, unspecified: Secondary | ICD-10-CM

## 2015-11-19 DIAGNOSIS — I10 Essential (primary) hypertension: Secondary | ICD-10-CM

## 2015-11-19 MED ORDER — LOSARTAN POTASSIUM-HCTZ 50-12.5 MG PO TABS: 1.0000 | ORAL_TABLET | Freq: Every day | ORAL | Status: DC

## 2015-11-19 NOTE — Progress Notes (Signed)
Patient ID: Vickie Patel, female   DOB: 1971-01-27, 44 y.o.   MRN: [id number]     HPI: Vickie Patel is a 44 y.o. female  who presents for a 4 month neurology follow-up evaluation.  She had initially presented to me in August 2016 to evaluate a history of an enlarged heart.   Vickie Patel has a history of hypertension, and remotely had taken HCTZ 25 mg.  She has been told of having an enlarged heart.  She had a chest x-ray in July 2015, which was interpreted as "stable cardiac enlargement."  She has had issues with labile blood pressure and on several evaluations.  Her blood pressures have been noted to be 180 and even 332 systolic during periods of increased stress and anxiety.  She has a long-standing history of obesity.  When she was initially married 15 years ago she weighed approximately 300 pounds.  She states her maximum weight had risen to 380.  She is caring for her mother.  She is concerned about her enlarged heart.  She admits to shortness of breath with some activity, but she is not very active.  She denies any chest pressure.  She is unaware of any palpitations.  She denies daytime sleepiness.  She does snore.  She denies waking up gasping for breath.   When I initially saw her, recommended that she change her HCTZ to losartan HCT 50/12.5 mg initially.  I recommended laboratory as well as a 2-D echo Doppler study.  Apparently, she has been confirmed through medical assistance programs and is home full that she will now be able to get her medications paid for.  She never had the blood work, echo and never filled her prescription.  She denies chest pain.  She denies palpitations.  She does note shortness of breath with walking but is not active.  She presents for reevaluation.  Past Medical History  Diagnosis Date  . Hypertension   . Uterine fibroid   . Enlarged heart     Past Surgical History  Procedure Laterality Date  . Tubal ligation      Allergies  Allergen Reactions  .  Penicillins Hives    Current Outpatient Prescriptions  Medication Sig Dispense Refill  . albuterol (PROVENTIL HFA;VENTOLIN HFA) 108 (90 BASE) MCG/ACT inhaler Inhale 2 puffs into the lungs every 4 (four) hours as needed for wheezing or shortness of breath. (Patient not taking: Reported on 11/19/2015) 1 Inhaler 0  . docusate sodium (COLACE) 100 MG capsule Take 1 capsule (100 mg total) by mouth 2 (two) times daily. (Patient not taking: Reported on 11/19/2015) 60 capsule 5  . ferrous sulfate 325 (65 FE) MG tablet Take 1 tablet (325 mg total) by mouth 3 (three) times daily with meals. (Patient not taking: Reported on 11/19/2015) 90 tablet 5  . ibuprofen (ADVIL,MOTRIN) 600 MG tablet Take 600 mg by mouth every 6 (six) hours as needed for moderate pain.    Marland Kitchen losartan-hydrochlorothiazide (HYZAAR) 50-12.5 MG tablet Take 1 tablet by mouth daily. 30 tablet 6  . meloxicam (MOBIC) 7.5 MG tablet Take 1 tablet (7.5 mg total) by mouth daily. (Patient not taking: Reported on 11/19/2015) 30 tablet 0   No current facility-administered medications for this visit.    Social History   Social History  . Marital Status: Single    Spouse Name: N/A  . Number of Children: N/A  . Years of Education: N/A   Occupational History  . Not on file.   Social  History Main Topics  . Smoking status: Former Smoker    Types: Cigarettes    Start date: 10/07/2015  . Smokeless tobacco: Never Used     Comment: 0.5 packs per week  . Alcohol Use: 0.0 oz/week    0 Standard drinks or equivalent per week     Comment: 1 pint per week  . Drug Use: Yes  . Sexual Activity: Yes    Birth Control/ Protection: Surgical   Other Topics Concern  . Not on file   Social History Narrative    Family History  Problem Relation Age of Onset  . Hypertension Mother     ROS General: Negative; No fevers, chills, or night sweats HEENT: Negative; No changes in vision or hearing, sinus congestion, difficulty swallowing Pulmonary: Negative; No  cough, wheezing, shortness of breath, hemoptysis Cardiovascular:  See HPI; No chest pain, presyncope, syncope, palpitations, edema GI: Negative; No nausea, vomiting, diarrhea, or abdominal pain GU: Negative; No dysuria, hematuria, or difficulty voiding Musculoskeletal: Negative; no myalgias, joint pain, or weakness Hematologic/Oncologic: Negative; no easy bruising, bleeding Endocrine: Negative; no heat/cold intolerance; no diabetes Neuro: Negative; no changes in balance, headaches Skin: Negative; No rashes or skin lesions Psychiatric: Negative; No behavioral problems, depression Sleep: Positive for snoring.  She wakes up approximately 3 times per night.  No daytime sleepiness, hypersomnolence, bruxism, restless legs, hypnogagnic hallucinations Other comprehensive 14 point system review is negative   Physical Exam BP 172/92 mmHg  Pulse 70  Ht 5' 5"  (1.651 m)  Wt 341 lb (154.677 kg)  BMI 56.75 kg/m2  Super morbid obesity Repeat blood pressure by me 164/90  Wt Readings from Last 3 Encounters:  11/19/15 341 lb (154.677 kg)  10/02/15 345 lb 3.2 oz (156.582 kg)  08/06/15 342 lb 1.6 oz (155.176 kg)   General: Alert, oriented, no distress.  Skin: normal turgor, no rashes, warm and dry HEENT: Normocephalic, atraumatic. Pupils equal round and reactive to light; sclera anicteric; extraocular muscles intact; Fundi without hemorrhages or exudates Nose without nasal septal hypertrophy Mouth/Parynx benign; Mallinpatti scale 3 Neck: No JVD, no carotid bruits; normal carotid upstroke Lungs: clear to ausculatation and percussion; no wheezing or rales Chest wall: without tenderness to palpitation Heart: PMI not displaced, RRR, s1 s2 normal, 6-0/7 short systolic murmur, no diastolic murmur, no rubs, gallops, thrills, or heaves Abdomen: soft, nontender; no hepatosplenomehaly, BS+; abdominal aorta nontender and not dilated by palpation. Back: no CVA tenderness Pulses 2+ Musculoskeletal: full range  of motion, normal strength, no joint deformities Extremities: no clubbing cyanosis or edema, Homan's sign negative  Neurologic: grossly nonfocal; Cranial nerves grossly wnl Psychologic: Normal mood and affect  ECG (independently read by me): Normal sinus rhythm at 70 bpm.  No ectopy.  QTc interval 464 ms.  ECG (independently read by me): Normal sinus rhythm at 70 bpm.  Nonspecific ST changes.  QTc interval 466 ms.  LABS: BMP Latest Ref Rng 07/09/2014 04/17/2014 10/29/2012  Glucose 70 - 99 mg/dL 90 93 94  BUN 6 - 23 mg/dL 11 11 7   Creatinine 0.50 - 1.10 mg/dL 1.05 1.20(H) 1.00  Sodium 137 - 147 mEq/L 140 142 141  Potassium 3.7 - 5.3 mEq/L 3.5(L) 4.0 3.9  Chloride 96 - 112 mEq/L 105 103 105  CO2 19 - 32 mEq/L 26 - -  Calcium 8.4 - 10.5 mg/dL 8.3(L) - -     Hepatic Function Latest Ref Rng 07/09/2014  Total Protein 6.0 - 8.3 g/dL 7.2  Albumin 3.5 - 5.2 g/dL 2.8(L)  AST 0 - 37 U/L 13  ALT 0 - 35 U/L 8  Alk Phosphatase 39 - 117 U/L 43  Total Bilirubin 0.3 - 1.2 mg/dL 0.3    CBC Latest Ref Rng 07/09/2014 04/17/2014 10/29/2012  WBC 4.0 - 10.5 K/uL 5.8 - -  Hemoglobin 12.0 - 15.0 g/dL 6.3(LL) 7.8(L) 8.8(L)  Hematocrit 36.0 - 46.0 % 23.5(L) 23.0(L) 26.0(L)  Platelets 150 - 400 K/uL 211 - -   Lab Results  Component Value Date   MCV 66.0* 07/09/2014   No results found for: TSH  BNP No results found for: BNP  ProBNP No results found for: PROBNP   Lipid Panel  No results found for: CHOL, TRIG, HDL, CHOLHDL, VLDL, LDLCALC, LDLDIRECT  RADIOLOGY: No results found.   ASSESSMENT AND PLAN: Vickie Patel is a 44 year old African-American female who has a history of hypertension, and uterine fibroids.  Remotely, she had only been treated with HCTZ 25 mg for blood pressure.  Review of several of her emergency room evaluations have revealed blood pressures that have been documented to be 200 on one occasion and in March 2016 at 185/95.  Review of laboratory from July 2015 has  revealed microcytic anemia.  Since I last saw her, she never started on losartan HCT and never underwent blood work or had her echo Doppler study.  I again discussed the importance of initiating therapy.  We discussed the adverse consequences long-term of poorly controlled hypertension tickly with regards to cardiovascular effects, stroke and kidney disease.  She does have a cardiac murmur in the aortic region, which is mild.  I am rescheduling her for the echo Doppler study.  This will be helpful to assess systolic and diastolic function as well as LVH.  I suspect component of her exertional shortness of breath is markedly reduced aerobic capacity since she rediscussed walking on a daily basis.  We discussed further weight loss.  She has super morbid obesity with a BMI of 56.75 today.  I'll also again recommended fasting blood work be obtained.  I will see her in 4 months for reevaluation.  Time spent: 25 minutes  Troy Sine, MD, [facility] 11/19/2015 1:31 PM

## 2015-11-19 NOTE — Patient Instructions (Addendum)
Your physician recommends that you return for lab work fasting.  Your physician has requested that you have an echocardiogram. Echocardiography is a painless test that uses sound waves to create images of your heart. It provides your doctor with information about the size and shape of your heart and how well your heart's chambers and valves are working. This procedure takes approximately one hour. There are no restrictions for this procedure. This will be done at the [facility].  Your physician has recommended you make the following change in your medication:  Start new prescription given today for Losartan- hct . [facility]  Your physician recommends that you schedule a follow-up appointment in:   4 months With Dr Claiborne Billings.

## 2015-12-02 ENCOUNTER — Other Ambulatory Visit: Payer: Self-pay | Admitting: Cardiovascular Disease

## 2015-12-02 DIAGNOSIS — I517 Cardiomegaly: Secondary | ICD-10-CM

## 2015-12-03 ENCOUNTER — Ambulatory Visit (HOSPITAL_COMMUNITY): Payer: Self-pay | Attending: Cardiology

## 2015-12-03 DIAGNOSIS — R0989 Other specified symptoms and signs involving the circulatory and respiratory systems: Secondary | ICD-10-CM

## 2016-02-08 ENCOUNTER — Emergency Department (HOSPITAL_COMMUNITY)
Admission: EM | Admit: 2016-02-08 | Discharge: 2016-02-08 | Disposition: A | Discharge status: Home / Self Care | Payer: Self-pay | Attending: Emergency Medicine | Admitting: Emergency Medicine

## 2016-02-08 ENCOUNTER — Encounter (HOSPITAL_COMMUNITY): Payer: Self-pay

## 2016-02-08 DIAGNOSIS — J029 Acute pharyngitis, unspecified: Secondary | ICD-10-CM | POA: Insufficient documentation

## 2016-02-08 DIAGNOSIS — F17210 Nicotine dependence, cigarettes, uncomplicated: Secondary | ICD-10-CM | POA: Insufficient documentation

## 2016-02-08 DIAGNOSIS — I10 Essential (primary) hypertension: Secondary | ICD-10-CM | POA: Insufficient documentation

## 2016-02-08 NOTE — ED Notes (Signed)
No answer from pt when called for a room in fast track from the lobby.

## 2016-02-08 NOTE — ED Notes (Signed)
Pt did not respond when called a second time for a fast track room.

## 2016-02-08 NOTE — ED Notes (Signed)
Pt c/o increasing sore throat x 1 day.  Pain score 2/10.  Pt has not taken anything for symptoms.  Pt has been not taken HTN medication x "a while."

## 2016-02-08 NOTE — ED Notes (Signed)
Prior to Triage, Pt noted to be speaking very loudly on the phone.  Pt would not put phone away for Will NT to obtain vital signs.

## 2016-02-08 NOTE — ED Notes (Signed)
Called pt for room, no response. 

## 2016-02-16 ENCOUNTER — Encounter (HOSPITAL_COMMUNITY): Payer: Self-pay | Admitting: Emergency Medicine

## 2016-02-16 ENCOUNTER — Emergency Department (HOSPITAL_COMMUNITY): Payer: Self-pay

## 2016-02-16 ENCOUNTER — Emergency Department (HOSPITAL_COMMUNITY)
Admission: EM | Admit: 2016-02-16 | Discharge: 2016-02-16 | Disposition: A | Discharge status: Home / Self Care | Payer: Self-pay | Attending: Emergency Medicine | Admitting: Emergency Medicine

## 2016-02-16 DIAGNOSIS — Z86018 Personal history of other benign neoplasm: Secondary | ICD-10-CM | POA: Insufficient documentation

## 2016-02-16 DIAGNOSIS — R059 Cough, unspecified: Secondary | ICD-10-CM

## 2016-02-16 DIAGNOSIS — Z880 Allergy status to penicillin: Secondary | ICD-10-CM | POA: Insufficient documentation

## 2016-02-16 DIAGNOSIS — J069 Acute upper respiratory infection, unspecified: Secondary | ICD-10-CM | POA: Insufficient documentation

## 2016-02-16 DIAGNOSIS — F17210 Nicotine dependence, cigarettes, uncomplicated: Secondary | ICD-10-CM | POA: Insufficient documentation

## 2016-02-16 DIAGNOSIS — R05 Cough: Secondary | ICD-10-CM

## 2016-02-16 DIAGNOSIS — Z79899 Other long term (current) drug therapy: Secondary | ICD-10-CM | POA: Insufficient documentation

## 2016-02-16 DIAGNOSIS — I10 Essential (primary) hypertension: Secondary | ICD-10-CM | POA: Insufficient documentation

## 2016-02-16 MED ORDER — BENZONATATE 100 MG PO CAPS: 100.0000 mg | ORAL_CAPSULE | Freq: Three times a day (TID) | ORAL | Status: DC

## 2016-02-16 NOTE — Discharge Instructions (Signed)
1. Medications: flonase (over the counter nasal spray for nasal congestion if needed), mucinex for congestion, tessalon for cough, continue usual home medications - [facility] Pick them up each month and take as directed.  2. Treatment: rest, drink plenty of fluids, take tylenol or ibuprofen for fever control if needed 3. Follow Up: Please follow up with your primary doctor in 3 days for discussion of your diagnoses and further evaluation after today's visit; if you do not have a primary care doctor use the resource guide provided to find one; Return to the ER for high fevers, difficulty breathing or other concerning symptoms.    Emergency Department Resource Guide  1) Find a Doctor and Pay Out of Pocket Although you won't have to find out who is covered by your insurance plan, it is a good idea to ask around and get recommendations. You will then need to call the office and see if the doctor you have chosen will accept you as a new patient and what types of options they offer for patients who are self-pay. Some doctors offer discounts or will set up payment plans for their patients who do not have insurance, but you will need to ask so you aren't surprised when you get to your appointment.  2) Contact Your Local Health Department Not all health departments have doctors that can see patients for sick visits, but many do, so it is worth a call to see if yours does. If you don't know where your local health department is, you can check in your phone book. The CDC also has a tool to help you locate your state's health department, and many state websites also have listings of all of their local health departments.  3) Find a [facility] If your illness is not likely to be very severe or complicated, you may want to try a walk in clinic. These are popping up all over the country in pharmacies, drugstores, and shopping centers. They're usually staffed by  nurse practitioners or physician assistants that have been trained to treat common illnesses and complaints. They're usually fairly quick and inexpensive. However, if you have serious medical issues or chronic medical problems, these are probably not your best option.  No Primary Care Doctor: - Call Health Connect at  [phone]: they can help you locate a primary care doctor that  accepts your insurance, provides certain services, etc. - Physician Referral Service: [phone]  Chronic Pain Problems: Organization         Address  Phone   Notes  [facility]  [phone] Patients need to be referred by their primary care doctor.   Medication Assistance: Organization         Address  Phone   Notes  [facility] Medication [facility] [address] [place], [place] [address] [phone] --Must be a resident of [facility] -- Must have NO insurance coverage whatsoever (no Medicaid/ Medicare, etc.) -- The pt. MUST have a primary care doctor that directs their care regularly and follows them in the community   [facility]  [phone]   [employer]  [phone]    Agencies that provide inexpensive medical care: Organization         Address  Phone   Notes  [place]  [phone]   [facility]    [phone]   [facility] [place]  [place], [place] [address] [phone]   [place] [address], [place] [phone]   [facility]    [phone]   [facility]    [phone]   [place] and [place] [address], [place] Phone:  [phone], Fax:  [phone] Hours of Operation:  9 am - 6 pm, M-F.  Also accepts Medicaid/Medicare and self-pay.  [facility] for [place] [address], [address], [place] Phone: [phone], Fax: [phone]. Hours of Operation:  8:30  am - 5:30 pm, M-F.  Also accepts Medicaid and self-pay.  [facility] [address] [place] Phone: [phone]   [place], [place], [place] [phone], Ext. [phone] Mondays & Thursdays: 7-9 AM.  First 15 patients are seen on a first come, first serve basis.    [place] Providers:  Organization         Address  Phone   Notes  [facility] [address], [place] [phone] Also accepts self-pay patients.  [facility] [id number] [place], [place]  [phone]   [place], [address], [place] [phone]   [facility] [address] [place] [phone]   Lucianne Lei [address], [place]   [phone] Only accepts [place] patients after they have their name applied to their card.   Self-Pay (no insurance) in [facility]:  Organization         Address  Phone   Notes  Sickle Cell Patients, [facility] [place] [phone]   [facility] [place] [phone]   [facility]  [place], [place],  [phone]   [facility]/Dr. Osei-Bonsu  [address] [place] or [address], [place] [phone] Phone number for both [place] and [place] locations is the same.  [facility] [address], [place] [phone]   [facility] [address] [place] or [address] [phone]   [facility] [address], [place] [phone], phone; [phone], fax Sees patients 1st and 3rd Saturday of every month.  Must not qualify for public or private insurance (i.e. Medicaid, Medicare, [facility]  Choice, Veterans' Benefits)  Household income should be no more than 200% of the poverty level The clinic cannot treat you if you are pregnant or think you are pregnant  Sexually transmitted diseases are not treated at the clinic.

## 2016-02-16 NOTE — ED Notes (Signed)
Patient transported to X-ray 

## 2016-02-16 NOTE — ED Provider Notes (Signed)
CSN: [id number]     Arrival date & time 02/16/16  [id number] History   First MD Initiated Contact with Patient 02/16/16 [phone]     Chief Complaint  Patient presents with  . Hypertension  . URI     (Consider location/radiation/quality/duration/timing/severity/associated sxs/prior Treatment) Patient is a 45 y.o. female presenting with hypertension and URI. The history is provided by the patient and medical records. No language interpreter was used.  Hypertension Associated symptoms include congestion, coughing, myalgias and a sore throat. Pertinent negatives include no abdominal pain, chills, fever, nausea, neck pain, rash, vomiting or weakness.  URI Presenting symptoms: congestion, cough and sore throat   Presenting symptoms: no fever   Associated symptoms: myalgias   Associated symptoms: no neck pain and no wheezing    Vickie Patel is a 45 y.o. female  with a PMH of HTN and "enlarged heart" who presents to the Emergency Department complaining of persistent nonproductive cough and nasal congestion x 4-5 days. Other associated symptoms include sore throat and body aches. No medications taken prior to arrival. Denies fever at home. Son with similar symptoms of cough/congestion/body aches.   Past Medical History  Diagnosis Date  . Hypertension   . Uterine fibroid   . Enlarged heart    Past Surgical History  Procedure Laterality Date  . Tubal ligation     Family History  Problem Relation Age of Onset  . Hypertension Mother    Social History  Substance Use Topics  . Smoking status: Current Some Day Smoker    Types: Cigarettes    Start date: 10/07/2015  . Smokeless tobacco: Never Used     Comment: 0.5 packs per week  . Alcohol Use: 0.0 oz/week    0 Standard drinks or equivalent per week     Comment: 1 pint per week   OB History    Gravida Para Term Preterm AB TAB SAB Ectopic Multiple Living   4 4        4      Review of Systems  Constitutional: Negative for fever and chills.   HENT: Positive for congestion and sore throat.   Eyes: Negative for visual disturbance.  Respiratory: Positive for cough. Negative for shortness of breath and wheezing.   Cardiovascular: Negative.   Gastrointestinal: Negative for nausea, vomiting and abdominal pain.  Genitourinary: Negative for dysuria.  Musculoskeletal: Positive for myalgias. Negative for neck pain and neck stiffness.  Skin: Negative for rash.  Neurological: Negative for dizziness and weakness.      Allergies  Penicillins  Home Medications   Prior to Admission medications   Medication Sig Start Date End Date Taking? Authorizing Provider  albuterol (PROVENTIL HFA;VENTOLIN HFA) 108 (90 BASE) MCG/ACT inhaler Inhale 2 puffs into the lungs every 4 (four) hours as needed for wheezing or shortness of breath. Patient not taking: Reported on 11/19/2015 02/21/15   Orpah Greek, MD  benzonatate (TESSALON) 100 MG capsule Take 1 capsule (100 mg total) by mouth every 8 (eight) hours. 02/16/16   Ozella Almond Ward, PA-C  docusate sodium (COLACE) 100 MG capsule Take 1 capsule (100 mg total) by mouth 2 (two) times daily. Patient not taking: Reported on 11/19/2015 04/17/14   Harden Mo, MD  ferrous sulfate 325 (65 FE) MG tablet Take 1 tablet (325 mg total) by mouth 3 (three) times daily with meals. Patient not taking: Reported on 11/19/2015 04/17/14   Harden Mo, MD  losartan-hydrochlorothiazide [facility]) 50-12.5 MG tablet Take 1 tablet by mouth daily.  Patient not taking: Reported on 02/16/2016 11/19/15   Troy Sine, MD   BP 154/62 mmHg  Pulse 74  Temp(Src) 97.7 F (36.5 C) (Oral)  Resp 14  Ht 5\' 5"  (1.651 m)  Wt 154.223 kg  BMI 56.58 kg/m2  SpO2 99%  LMP 11/12/2015 Physical Exam  Constitutional: She is oriented to person, place, and time. She appears well-developed and well-nourished.  Alert, speaking in full sentences, and in no acute distress  HENT:  Head: Normocephalic and atraumatic.  OP with erythema, no  exudates, no tonsillar hypertrophy.   Neck: Normal range of motion. Neck supple.  No nuchal rigidity.   Cardiovascular: Normal rate, regular rhythm and normal heart sounds.  Exam reveals no gallop and no friction rub.   No murmur heard. Pulmonary/Chest: Effort normal and breath sounds normal. No respiratory distress. She has no wheezes. She has no rales. She exhibits no tenderness.  Abdominal: Soft. She exhibits no distension. There is no tenderness.  Musculoskeletal: She exhibits no edema.  Neurological: She is alert and oriented to person, place, and time.  Skin: Skin is warm and dry. No rash noted.  Nursing note and vitals reviewed.   ED Course  Procedures (including critical care time) Labs Review Labs Reviewed - No data to display  Imaging Review Dg Chest 2 View  02/16/2016  CLINICAL DATA:  45 year old female with cough and medial chest pain since last Thursday. EXAM: CHEST  2 VIEW COMPARISON:  Chest x-ray 02/21/2015. FINDINGS: Lung volumes are normal. No consolidative airspace disease. No pleural effusions. No evidence of pulmonary edema. No pneumothorax. No suspicious appearing pulmonary nodules or masses. Mild cardiomegaly is unchanged. Upper mediastinal contours are unremarkable. IMPRESSION: 1. No radiographic evidence of acute cardiopulmonary disease. 2. Mild cardiomegaly is unchanged. Electronically Signed   By: Vinnie [place]ton M.D.   On: 02/16/2016 07:19   I have personally reviewed and evaluated these images and lab results as part of my medical decision-making.   EKG Interpretation None      MDM   Final diagnoses:  Cough  Essential hypertension  URI (upper respiratory infection)   Vickie Patel is afebrile, non-toxic appearing with a clear lung exam. Mild rhinorrhea and OP with erythema, no exudates. Likely viral URI. Patient is agreeable to symptomatic treatment. Resource guide given for PCP follow up. Spoke at length about emergent, changing, or worsening of  symptoms that should prompt return to ER. Patient voices understanding and is agreeable to plan.  Patient also has concern of elevated BP. Patient has not been on her BP medications in "a while" It appears last refill was 11/19/15 with 6 refills. Patient was not aware that refills were available. [facility]   Blood pressure 154/62, pulse 74, temperature 97.7 F (36.5 C), temperature source Oral, resp. rate 14, height 5\' 5"  (1.651 m), weight 154.223 kg, last menstrual period 11/12/2015, SpO2 99 %.  [facility] Ward, PA-C 02/16/16 [id number]  Varney Biles, MD 02/20/16 1505

## 2016-02-16 NOTE — ED Notes (Signed)
Patient here with complaint of elevated blood pressures at home coupled with upper respiratory infection. States onset 2/23. Reports that she went to [facility] for the same but LWBS. BP there was 191/71; here 158/85.

## 2016-08-25 ENCOUNTER — Encounter (HOSPITAL_COMMUNITY): Payer: Self-pay | Admitting: Emergency Medicine

## 2016-08-25 ENCOUNTER — Ambulatory Visit (HOSPITAL_COMMUNITY)
Admission: EM | Admit: 2016-08-25 | Discharge: 2016-08-25 | Disposition: A | Discharge status: Home / Self Care | Payer: Self-pay | Attending: Physician Assistant | Admitting: Physician Assistant

## 2016-08-25 DIAGNOSIS — Z880 Allergy status to penicillin: Secondary | ICD-10-CM | POA: Insufficient documentation

## 2016-08-25 DIAGNOSIS — B9689 Other specified bacterial agents as the cause of diseases classified elsewhere: Secondary | ICD-10-CM

## 2016-08-25 DIAGNOSIS — A499 Bacterial infection, unspecified: Secondary | ICD-10-CM

## 2016-08-25 DIAGNOSIS — Z79899 Other long term (current) drug therapy: Secondary | ICD-10-CM | POA: Insufficient documentation

## 2016-08-25 DIAGNOSIS — J029 Acute pharyngitis, unspecified: Secondary | ICD-10-CM | POA: Insufficient documentation

## 2016-08-25 DIAGNOSIS — F17210 Nicotine dependence, cigarettes, uncomplicated: Secondary | ICD-10-CM | POA: Insufficient documentation

## 2016-08-25 DIAGNOSIS — N760 Acute vaginitis: Secondary | ICD-10-CM | POA: Insufficient documentation

## 2016-08-25 LAB — POCT RAPID STREP A
Streptococcus, Group A Screen (Direct): NEGATIVE
Streptococcus, Group A Screen (Direct): NEGATIVE

## 2016-08-25 MED ORDER — METRONIDAZOLE 500 MG PO TABS: 500.0000 mg | ORAL_TABLET | Freq: Three times a day (TID) | ORAL | 0 refills | Status: DC

## 2016-08-25 NOTE — ED Notes (Signed)
D/c by frank p, pa 

## 2016-08-25 NOTE — ED Provider Notes (Signed)
CSN: [id number]     Arrival date & time 08/25/16  1005 History   None    Chief Complaint  Patient presents with  . 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 No home treatment. Pain score is 2. No fever. Hurts to swallow. Second complaint is patient feels she has bacterial vaginosis she has a strong odor from her vaginal area. She states that she is not sexually active as her husband is currently incarcerated. And she would like treatment for this also. She states that she does not need a work note. Past Medical History:  Diagnosis Date  . Enlarged heart   . Hypertension   . Uterine fibroid    Past Surgical History:  Procedure Laterality Date  . TUBAL LIGATION     Family History  Problem Relation Age of Onset  . Hypertension Mother    Social History  Substance Use Topics  . Smoking status: Current Some Day Smoker    Types: Cigarettes    Start date: 10/07/2015  . Smokeless tobacco: Never Used     Comment: 0.5 packs per week  . Alcohol use 0.0 oz/week     Comment: 1 pint per week   OB History    Gravida Para Term Preterm AB Living   4 4       4    SAB TAB Ectopic Multiple Live Births                 Review of Systems  Denies: HEADACHE, NAUSEA, ABDOMINAL PAIN, CHEST PAIN, CONGESTION, DYSURIA, SHORTNESS OF BREATH  Allergies  Penicillins  Home Medications   Prior to Admission medications   Medication Sig Start Date End Date Taking? Authorizing Provider  albuterol (PROVENTIL HFA;VENTOLIN HFA) 108 (90 BASE) MCG/ACT inhaler Inhale 2 puffs into the lungs every 4 (four) hours as needed for wheezing or shortness of breath. Patient not taking: Reported on 11/19/2015 02/21/15   Orpah Greek, MD  benzonatate (TESSALON) 100 MG capsule Take 1 capsule (100 mg total) by mouth every 8 (eight) hours. 02/16/16   Ozella Almond Ward, PA-C  docusate  sodium (COLACE) 100 MG capsule Take 1 capsule (100 mg total) by mouth 2 (two) times daily. Patient not taking: Reported on 11/19/2015 04/17/14   Harden Mo, MD  ferrous sulfate 325 (65 FE) MG tablet Take 1 tablet (325 mg total) by mouth 3 (three) times daily with meals. Patient not taking: Reported on 11/19/2015 04/17/14   Harden Mo, MD  losartan-hydrochlorothiazide [facility]) 50-12.5 MG tablet Take 1 tablet by mouth daily. Patient not taking: Reported on 02/16/2016 11/19/15   Troy Sine, MD   Meds Ordered and Administered this Visit  Medications - No data to display  BP 175/81 (BP Location: Right Arm)   Pulse 76   Temp 98.5 F (36.9 C) (Oral)   Resp 18   LMP 08/11/2016   SpO2 100%  No data found.   Physical Exam NURSES NOTES AND VITAL SIGNS REVIEWED. CONSTITUTIONAL: Well developed, well nourished, no acute distress HEENT: normocephalic, atraumatic EYES: Conjunctiva normal NECK:normal ROM, supple, no adenopathy PULMONARY:No respiratory distress, normal effort ABDOMINAL: Soft, ND, NT BS+, No CVAT MUSCULOSKELETAL: Normal ROM of all extremities,  SKIN: warm and dry without rash PSYCHIATRIC: Mood and affect, behavior are normal  Urgent Care Course   Clinical Course    Procedures (including critical care time)  Labs Review Labs Reviewed  POCT RAPID STREP A  Imaging Review No results found.   Visual Acuity Review  Right Eye Distance:   Left Eye Distance:   Bilateral Distance:    Right Eye Near:   Left Eye Near:    Bilateral Near:         MDM   1. Viral pharyngitis   2. BV (bacterial vaginosis)   BY HISTORY  Patient is reassured that there are no issues that require transfer to higher level of care at this time or additional tests. Patient is advised to continue home symptomatic treatment. Patient is advised that if there are new or worsening symptoms to attend the emergency department, contact primary care provider, or return to UC. Instructions  of care provided discharged home in stable condition.    THIS NOTE WAS GENERATED USING A VOICE RECOGNITION SOFTWARE PROGRAM. ALL REASONABLE EFFORTS  WERE MADE TO PROOFREAD THIS DOCUMENT FOR ACCURACY.  I have verbally reviewed the discharge instructions with the patient. A printed AVS was given to the patient.  All questions were answered prior to discharge.      Konrad Felix, PA 08/25/16 1136

## 2016-08-25 NOTE — ED Triage Notes (Signed)
Here for ST onset x2 days associated w/fevers, dry cough, odynophagia and BA  Voices no other concerns... A&O x4... NAD

## 2016-08-25 NOTE — ED Notes (Signed)
Call back number verified and updated in EPIC... Adv pt to not have SI until lab results comeback neg.... Also adv pt lab results will be on MyChart; instructions given .... Pt verb understanding.   

## 2016-08-27 LAB — CULTURE, GROUP A STREP (THRC)

## 2017-05-05 ENCOUNTER — Encounter (HOSPITAL_COMMUNITY): Payer: Self-pay | Admitting: *Deleted

## 2017-05-05 ENCOUNTER — Inpatient Hospital Stay (HOSPITAL_COMMUNITY)
Admission: AD | Admit: 2017-05-05 | Discharge: 2017-05-05 | Disposition: A | Discharge status: Home / Self Care | Payer: Self-pay | Source: Ambulatory Visit | Attending: Obstetrics & Gynecology | Admitting: Obstetrics & Gynecology

## 2017-05-05 DIAGNOSIS — B373 Candidiasis of vulva and vagina: Secondary | ICD-10-CM | POA: Insufficient documentation

## 2017-05-05 DIAGNOSIS — I10 Essential (primary) hypertension: Secondary | ICD-10-CM | POA: Insufficient documentation

## 2017-05-05 DIAGNOSIS — Z113 Encounter for screening for infections with a predominantly sexual mode of transmission: Secondary | ICD-10-CM | POA: Insufficient documentation

## 2017-05-05 DIAGNOSIS — Z880 Allergy status to penicillin: Secondary | ICD-10-CM | POA: Insufficient documentation

## 2017-05-05 DIAGNOSIS — B3731 Acute candidiasis of vulva and vagina: Secondary | ICD-10-CM

## 2017-05-05 DIAGNOSIS — Z8249 Family history of ischemic heart disease and other diseases of the circulatory system: Secondary | ICD-10-CM | POA: Insufficient documentation

## 2017-05-05 DIAGNOSIS — F17210 Nicotine dependence, cigarettes, uncomplicated: Secondary | ICD-10-CM | POA: Insufficient documentation

## 2017-05-05 HISTORY — DX: Anemia, unspecified: D64.9

## 2017-05-05 HISTORY — DX: Encounter for other specified aftercare: Z51.89

## 2017-05-05 HISTORY — DX: Other constipation: K59.09

## 2017-05-05 LAB — URINALYSIS, ROUTINE W REFLEX MICROSCOPIC
Bilirubin Urine: NEGATIVE
Glucose, UA: NEGATIVE mg/dL
Ketones, ur: NEGATIVE mg/dL
NITRITE: NEGATIVE
Protein, ur: 100 mg/dL — AB
SPECIFIC GRAVITY, URINE: 1.01 (ref 1.005–1.030)
pH: 6 (ref 5.0–8.0)

## 2017-05-05 LAB — URINALYSIS, MICROSCOPIC (REFLEX)
BACTERIA UA: NONE SEEN
RBC / HPF: NONE SEEN RBC/hpf (ref 0–5)

## 2017-05-05 LAB — POCT PREGNANCY, URINE: Preg Test, Ur: NEGATIVE

## 2017-05-05 LAB — WET PREP, GENITAL
Clue Cells Wet Prep HPF POC: NONE SEEN
Sperm: NONE SEEN
Trich, Wet Prep: NONE SEEN

## 2017-05-05 MED ORDER — FLUCONAZOLE 150 MG PO TABS: 150.0000 mg | ORAL_TABLET | Freq: Every day | ORAL | 1 refills | Status: DC

## 2017-05-05 MED ORDER — METRONIDAZOLE 500 MG PO TABS
500.0000 mg | ORAL_TABLET | Freq: Two times a day (BID) | ORAL | 0 refills | Status: DC
Start: 2017-05-05 — End: 2018-10-19

## 2017-05-05 NOTE — MAU Note (Signed)
C/o vaginal itching and burning for almost a week; c/o vaginal discharge also;

## 2017-05-05 NOTE — Discharge Instructions (Signed)

## 2017-05-05 NOTE — MAU Provider Note (Signed)
History     CSN: [id number]  Arrival date and time: 05/05/17 8341  First Provider Initiated Contact with Patient 05/05/17 1005      Chief Complaint  Patient presents with  . Vaginal Itching   HPI Vickie Patel is a 46 y.o. female who presents for vaginal irritation. Symptoms began last week. Reports itching & irritation that is constant. Denies vaginal bleeding, vaginal discharge, dysuria, dyspareunia, or postcoital bleeding. Pt has recently switched soaps. Also, has new sexual partner & does not routinely use condoms. Would like STD testing.   Past Medical History:  Diagnosis Date  . Constipation, chronic   . Enlarged heart   . Hypertension   . Uterine fibroid     Past Surgical History:  Procedure Laterality Date  . TUBAL LIGATION      Family History  Problem Relation Age of Onset  . Hypertension Mother     Social History  Substance Use Topics  . Smoking status: Current Some Day Smoker    Packs/day: 0.25    Types: Cigarettes    Start date: 10/07/2015  . Smokeless tobacco: Current User     Comment: 0.5 packs per week  . Alcohol use 0.0 oz/week     Comment: 1 pint per week    Allergies:  Allergies  Allergen Reactions  . Penicillins Hives    Prescriptions Prior to Admission  Medication Sig Dispense Refill Last Dose  . albuterol (PROVENTIL HFA;VENTOLIN HFA) 108 (90 BASE) MCG/ACT inhaler Inhale 2 puffs into the lungs every 4 (four) hours as needed for wheezing or shortness of breath. (Patient not taking: Reported on 11/19/2015) 1 Inhaler 0 Unknown at Unknown time  . benzonatate (TESSALON) 100 MG capsule Take 1 capsule (100 mg total) by mouth every 8 (eight) hours. 21 capsule 0   . docusate sodium (COLACE) 100 MG capsule Take 1 capsule (100 mg total) by mouth 2 (two) times daily. (Patient not taking: Reported on 11/19/2015) 60 capsule 5 Unknown at Unknown time  . ferrous sulfate 325 (65 FE) MG tablet Take 1 tablet (325 mg total) by mouth 3 (three) times daily with  meals. (Patient not taking: Reported on 11/19/2015) 90 tablet 5 Unknown at Unknown time  . losartan-hydrochlorothiazide (HYZAAR) 50-12.5 MG tablet Take 1 tablet by mouth daily. (Patient not taking: Reported on 02/16/2016) 30 tablet 6 Unknown at Unknown time  . metroNIDAZOLE (FLAGYL) 500 MG tablet Take 1 tablet (500 mg total) by mouth 3 (three) times daily. 21 tablet 0     Review of Systems  Constitutional: Negative.   Gastrointestinal: Negative.   Genitourinary: Negative for dyspareunia, dysuria, vaginal bleeding and vaginal discharge.       + vaginal itching/irritation   Physical Exam   Blood pressure (!) 156/88, pulse (!) 59, temperature 98.2 F (36.8 C), temperature source Oral, resp. rate 18.  Physical Exam  Nursing note and vitals reviewed. Constitutional: She is oriented to person, place, and time. She appears well-developed and well-nourished. No distress.  HENT:  Head: Normocephalic and atraumatic.  Eyes: Conjunctivae are normal. Right eye exhibits no discharge. Left eye exhibits no discharge. No scleral icterus.  Neck: Normal range of motion.  Cardiovascular: Normal rate, regular rhythm and normal heart sounds.   No murmur heard. Respiratory: Effort normal and breath sounds normal. No respiratory distress. She has no wheezes.  GI: Soft. There is no tenderness.  Genitourinary: Cervix exhibits no motion tenderness and no friability. There is erythema in the vagina. No bleeding in the vagina. Vaginal  discharge (small amount of thick white discharge) found.  Neurological: She is alert and oriented to person, place, and time.  Skin: Skin is warm and dry. She is not diaphoretic.  Psychiatric: She has a normal mood and affect. Her behavior is normal. Judgment and thought content normal.    MAU Course  Procedures Results for orders placed or performed during the hospital encounter of 05/05/17 (from the past 24 hour(s))  Urinalysis, Routine w reflex microscopic     Status: Abnormal    Collection Time: 05/05/17  9:33 AM  Result Value Ref Range   Color, Urine YELLOW YELLOW   APPearance CLEAR CLEAR   Specific Gravity, Urine 1.010 1.005 - 1.030   pH 6.0 5.0 - 8.0   Glucose, UA NEGATIVE NEGATIVE mg/dL   Hgb urine dipstick TRACE (A) NEGATIVE   Bilirubin Urine NEGATIVE NEGATIVE   Ketones, ur NEGATIVE NEGATIVE mg/dL   Protein, ur 100 (A) NEGATIVE mg/dL   Nitrite NEGATIVE NEGATIVE   Leukocytes, UA SMALL (A) NEGATIVE  Urinalysis, Microscopic (reflex)     Status: Abnormal   Collection Time: 05/05/17  9:33 AM  Result Value Ref Range   RBC / HPF NONE SEEN 0 - 5 RBC/hpf   WBC, UA 0-5 0 - 5 WBC/hpf   Bacteria, UA NONE SEEN NONE SEEN   Squamous Epithelial / LPF 0-5 (A) NONE SEEN  Pregnancy, urine POC     Status: None   Collection Time: 05/05/17  9:37 AM  Result Value Ref Range   Preg Test, Ur NEGATIVE NEGATIVE  Wet prep, genital     Status: Abnormal   Collection Time: 05/05/17 10:20 AM  Result Value Ref Range   Yeast Wet Prep HPF POC PRESENT (A) NONE SEEN   Trich, Wet Prep NONE SEEN NONE SEEN   Clue Cells Wet Prep HPF POC NONE SEEN NONE SEEN   WBC, Wet Prep HPF POC MODERATE (A) NONE SEEN   Sperm NONE SEEN     MDM UPT negative GC/CT & wet prep -- pt declines blood work to complete STD testing Hx of hypertension, currently no meds & between PCPs right now b/c medicaid in process of being reinstated. Denies headache, CP, or SOB.  Assessment and Plan  A; 1. Vaginal yeast infection   2. Screen for STD (sexually transmitted disease)    P: Discharge home Rx diflucan & flagyl (per pt's insistence) Discussed reasons to return to MAU vs ED Start care with PCP GC/CT pending   Jorje Guild 05/05/2017, 10:02 AM

## 2017-05-08 LAB — GC/CHLAMYDIA PROBE AMP (~~LOC~~) NOT AT ARMC
Chlamydia: NEGATIVE
NEISSERIA GONORRHEA: NEGATIVE

## 2017-06-29 ENCOUNTER — Ambulatory Visit (INDEPENDENT_AMBULATORY_CARE_PROVIDER_SITE_OTHER): Payer: Self-pay

## 2017-06-29 DIAGNOSIS — Z3202 Encounter for pregnancy test, result negative: Secondary | ICD-10-CM

## 2017-06-29 LAB — POCT PREGNANCY, URINE: Preg Test, Ur: NEGATIVE

## 2017-06-29 NOTE — Progress Notes (Signed)
Patient presented to office today for pregnancy test. Test confirms she is not pregnant at this time. Patient was excited about receiving this news.

## 2018-10-19 ENCOUNTER — Ambulatory Visit (HOSPITAL_COMMUNITY)
Admission: EM | Admit: 2018-10-19 | Discharge: 2018-10-19 | Disposition: A | Discharge status: Home / Self Care | Payer: Self-pay | Attending: Family Medicine | Admitting: Family Medicine

## 2018-10-19 ENCOUNTER — Encounter (HOSPITAL_COMMUNITY): Payer: Self-pay

## 2018-10-19 DIAGNOSIS — H1033 Unspecified acute conjunctivitis, bilateral: Secondary | ICD-10-CM

## 2018-10-19 MED ORDER — TOBRAMYCIN 0.3 % OP SOLN: 1.0000 [drp] | OPHTHALMIC | 0 refills | Status: DC

## 2018-10-19 MED ORDER — CEPHALEXIN 500 MG PO CAPS: 500.0000 mg | ORAL_CAPSULE | Freq: Two times a day (BID) | ORAL | 0 refills | Status: DC

## 2018-10-19 NOTE — Discharge Instructions (Signed)
Use eye drops as directed Take antibiotic IF YOU GET WORSE or have face swelling

## 2018-10-19 NOTE — ED Provider Notes (Signed)
[place]    CSN: [id number] Arrival date & time: 10/19/18  1816     History   Chief Complaint Chief Complaint  Patient presents with  . Conjunctivitis    HPI Vickie Patel is a 47 y.o. female.   HPI  Vickie Patel is here with her mother.  They both have pinkeye.  Red eyes, itching, drainage.  They recently had a houseguest with pinkeye.  They think this is where it came from.  Vickie Patel states she has no cough cold runny nose sore throat or fever.  Her vision is normal.  Past Medical History:  Diagnosis Date  . Anemia   . Blood transfusion without reported diagnosis   . Constipation, chronic   . Enlarged heart   . Hypertension   . Uterine fibroid     Patient Active Problem List   Diagnosis Date Noted  . Essential hypertension 08/06/2015  . Hypertensive heart disease 08/06/2015  . Morbid obesity ([place]) 08/06/2015  . Cardiac murmur 08/06/2015    Past Surgical History:  Procedure Laterality Date  . TUBAL LIGATION      OB History    Gravida  4   Para  4   Term      Preterm      AB      Living  4     SAB      TAB      Ectopic      Multiple      Live Births               Home Medications    Prior to Admission medications   Medication Sig Start Date End Date Taking? Authorizing Provider  aspirin EC 81 MG tablet Take 81 mg by mouth daily.    [provider]  cephALEXin (KEFLEX) 500 MG capsule Take 1 capsule (500 mg total) by mouth 2 (two) times daily. 10/19/18   Raylene Everts, MD  tobramycin (TOBREX) 0.3 % ophthalmic solution Place 1 drop into both eyes every 4 (four) hours. 10/19/18   Raylene Everts, MD    Family History Family History  Problem Relation Age of Onset  . Hypertension Mother     Social History Social History   Tobacco Use  . Smoking status: Current Some Day Smoker    Packs/day: 0.25    Types: Cigarettes    Start date: 10/07/2015  . Smokeless tobacco: Current User  . Tobacco comment: 0.5 packs  per week  Substance Use Topics  . Alcohol use: Yes    Alcohol/week: 0.0 standard drinks    Comment: 1 pint per week  . Drug use: No     Allergies   Penicillins   Review of Systems Review of Systems  Constitutional: Negative for chills and fever.  HENT: Negative for ear pain and sore throat.   Eyes: Positive for discharge, redness and itching. Negative for pain and visual disturbance.  Respiratory: Negative for cough and shortness of breath.   Cardiovascular: Negative for chest pain and palpitations.  Gastrointestinal: Negative for abdominal pain and vomiting.  Genitourinary: Negative for dysuria and hematuria.  Musculoskeletal: Negative for arthralgias and back pain.  Skin: Negative for color change and rash.  Neurological: Negative for seizures and syncope.  All other systems reviewed and are negative.    Physical Exam Triage Vital Signs ED Triage Vitals  Enc Vitals Group     BP 10/19/18 1939 (!) 171/97     Pulse Rate 10/19/18 1939 65  Resp 10/19/18 1939 20     Temp 10/19/18 1939 97.6 F (36.4 C)     Temp Source 10/19/18 1939 Oral     SpO2 10/19/18 1939 100 %     Weight --      Height --      Head Circumference --      Peak Flow --      Pain Score 10/19/18 1938 2     Pain Loc --      Pain Edu? --      Excl. in [place]? --    No data found.  Updated Vital Signs BP (!) 171/97 (BP Location: Right Arm)   Pulse 65   Temp 97.6 F (36.4 C) (Oral)   Resp 20   SpO2 100%   Visual Acuity Right Eye Distance:   Left Eye Distance:   Bilateral Distance:    Right Eye Near:   Left Eye Near:    Bilateral Near:     Physical Exam  Constitutional: She appears well-developed and well-nourished. No distress.  HENT:  Head: Normocephalic and atraumatic.  Mouth/Throat: Oropharynx is clear and moist.  Eyes: Pupils are equal, round, and reactive to light. Conjunctivae are normal.  Both eyes with mild conjunctival injection.  Lids are everted.  No FB seen.  Slight yellow  discharge in the corners.  Neck: Normal range of motion.  Cardiovascular: Normal rate.  Pulmonary/Chest: Effort normal. No respiratory distress.  Abdominal: Soft. She exhibits no distension.  Musculoskeletal: Normal range of motion. She exhibits no edema.  Lymphadenopathy:    She has no cervical adenopathy.  Neurological: She is alert.  Skin: Skin is warm and dry.  Psychiatric: She has a normal mood and affect. Her behavior is normal.     UC Treatments / Results  Labs (all labs ordered are listed, but only abnormal results are displayed) Labs Reviewed - No data to display  EKG None  Radiology No results found.  Procedures Procedures (including critical care time)  Medications Ordered in UC Medications - No data to display  Initial Impression / Assessment and Plan / UC Course  I have reviewed the triage vital signs and the nursing notes.  Pertinent labs & imaging results that were available during my care of the patient were reviewed by me and considered in my medical decision making (see chart for details).     Talked about viral conjunctivitis versus bacterial.  Contagiousness.  Treatment. Final Clinical Impressions(s) / UC Diagnoses   Final diagnoses:  Acute bacterial conjunctivitis of both eyes     Discharge Instructions     Use eye drops as directed Take antibiotic IF YOU GET WORSE or have face swelling   ED Prescriptions    Medication Sig Dispense Auth. Provider   tobramycin (TOBREX) 0.3 % ophthalmic solution Place 1 drop into both eyes every 4 (four) hours. 5 mL Raylene Everts, MD   cephALEXin (KEFLEX) 500 MG capsule Take 1 capsule (500 mg total) by mouth 2 (two) times daily. 10 capsule Raylene Everts, MD     Controlled Substance Prescriptions [place] Controlled Substance Registry consulted? Not Applicable   Raylene Everts, MD 10/19/18 2145

## 2018-10-19 NOTE — ED Triage Notes (Signed)
Pt presents with eye irritation in both eyes and was exposed to pink eye.

## 2018-11-02 ENCOUNTER — Encounter (HOSPITAL_COMMUNITY): Payer: Self-pay

## 2018-11-02 ENCOUNTER — Ambulatory Visit (INDEPENDENT_AMBULATORY_CARE_PROVIDER_SITE_OTHER): Payer: Self-pay

## 2018-11-02 ENCOUNTER — Ambulatory Visit (HOSPITAL_COMMUNITY)
Admission: EM | Admit: 2018-11-02 | Discharge: 2018-11-02 | Disposition: A | Discharge status: Home / Self Care | Payer: Self-pay | Attending: Physician Assistant | Admitting: Physician Assistant

## 2018-11-02 DIAGNOSIS — I10 Essential (primary) hypertension: Secondary | ICD-10-CM

## 2018-11-02 DIAGNOSIS — M79671 Pain in right foot: Secondary | ICD-10-CM

## 2018-11-02 MED ORDER — COLCHICINE 0.6 MG PO TABS: 0.6000 mg | ORAL_TABLET | Freq: Two times a day (BID) | ORAL | 1 refills | Status: DC

## 2018-11-02 MED ORDER — DICLOFENAC SODIUM 75 MG PO TBEC: 75.0000 mg | DELAYED_RELEASE_TABLET | Freq: Two times a day (BID) | ORAL | 0 refills | Status: DC

## 2018-11-02 MED ORDER — AMLODIPINE BESYLATE 5 MG PO TABS: 5.0000 mg | ORAL_TABLET | Freq: Every day | ORAL | 1 refills | Status: DC

## 2018-11-02 NOTE — ED Triage Notes (Signed)
Pt presents right leg and foot pain.

## 2018-11-02 NOTE — ED Provider Notes (Signed)
[place]    CSN: [id number] Arrival date & time: 11/02/18  1658     History   Chief Complaint Chief Complaint  Patient presents with  . Leg Pain    Right  . Foot Pain    Right    HPI Vickie Patel is a 47 y.o. female.   The history is provided by the patient. No language interpreter was used.  Leg Pain  Location:  Foot Injury: no   Foot location:  R foot Pain details:    Quality:  Aching   Radiates to:  Does not radiate   Severity:  Moderate   Onset quality:  Gradual   Timing:  Constant   Progression:  Worsening Chronicity:  New Relieved by:  Nothing Worsened by:  Nothing Ineffective treatments:  None tried Associated symptoms: no fever   Risk factors: no known bone disorder   Foot Pain   Pt complains of pain to her right foot.  Pt reports pain and trouble walking.  Pt also reports she has a dark area to her left lower leg.  Pt reports she hit her leg over a month ago and it is still dark.  Pt reports she is having trouble walking.  Pt is using a cane.   Pt reports she does not go to the doctor.  Pt reports she does not tolerate needles and does not want her blood drawn  Pt hypertensive today.  Pt here on 11/1 for conjunctivitis.  Pt was hypertensive on that date.   Past Medical History:  Diagnosis Date  . Anemia   . Blood transfusion without reported diagnosis   . Constipation, chronic   . Enlarged heart   . Hypertension   . Uterine fibroid     Patient Active Problem List   Diagnosis Date Noted  . Essential hypertension 08/06/2015  . Hypertensive heart disease 08/06/2015  . Morbid obesity ([place]) 08/06/2015  . Cardiac murmur 08/06/2015    Past Surgical History:  Procedure Laterality Date  . TUBAL LIGATION      OB History    Gravida  4   Para  4   Term      Preterm      AB      Living  4     SAB      TAB      Ectopic      Multiple      Live Births               Home Medications    Prior to Admission  medications   Medication Sig Start Date End Date Taking? Authorizing Provider  aspirin EC 81 MG tablet Take 81 mg by mouth daily.    [provider]  cephALEXin (KEFLEX) 500 MG capsule Take 1 capsule (500 mg total) by mouth 2 (two) times daily. 10/19/18   Raylene Everts, MD  tobramycin (TOBREX) 0.3 % ophthalmic solution Place 1 drop into both eyes every 4 (four) hours. 10/19/18   Raylene Everts, MD    Family History Family History  Problem Relation Age of Onset  . Hypertension Mother     Social History Social History   Tobacco Use  . Smoking status: Current Some Day Smoker    Packs/day: 0.25    Types: Cigarettes    Start date: 10/07/2015  . Smokeless tobacco: Current User  . Tobacco comment: 0.5 packs per week  Substance Use Topics  . Alcohol use: Yes  Alcohol/week: 0.0 standard drinks    Comment: 1 pint per week  . Drug use: No     Allergies   Penicillins   Review of Systems Review of Systems  Constitutional: Negative for fever.  Musculoskeletal: Positive for joint swelling.  All other systems reviewed and are negative.    Physical Exam Triage Vital Signs ED Triage Vitals  Enc Vitals Group     BP 11/02/18 1804 (!) 186/88     Pulse Rate 11/02/18 1804 68     Resp 11/02/18 1804 20     Temp 11/02/18 1804 98.1 F (36.7 C)     Temp Source 11/02/18 1804 Oral     SpO2 11/02/18 1804 98 %     Weight --      Height --      Head Circumference --      Peak Flow --      Pain Score 11/02/18 1806 8     Pain Loc --      Pain Edu? --      Excl. in [place]? --    No data found.  Updated Vital Signs BP (!) 186/88 (BP Location: Right Arm)   Pulse 68   Temp 98.1 F (36.7 C) (Oral)   Resp 20   SpO2 98%   Visual Acuity Right Eye Distance:   Left Eye Distance:   Bilateral Distance:    Right Eye Near:   Left Eye Near:    Bilateral Near:     Physical Exam  Constitutional: She appears well-developed and well-nourished.  Musculoskeletal: She  exhibits tenderness.  Neurological: She is alert.  Skin: Skin is warm.  Psychiatric: She has a normal mood and affect.  Vitals reviewed.    UC Treatments / Results  Labs (all labs ordered are listed, but only abnormal results are displayed) Labs Reviewed - No data to display  EKG None  Radiology No results found.  Procedures Procedures (including critical care time)  Medications Ordered in UC Medications - No data to display  Initial Impression / Assessment and Plan / UC Course  I have reviewed the triage vital signs and the nursing notes.  Pertinent labs & imaging results that were available during my care of the patient were reviewed by me and considered in my medical decision making (see chart for details).     MDM   Pt has multiple issues which she needs primary care for.  Pt advised to call the wellness clinic to schedule appointment for medical management.   Final Clinical Impressions(s) / UC Diagnoses   Final diagnoses:  Foot pain, right  Essential hypertension     Discharge Instructions     Return if any problems.     ED Prescriptions    Medication Sig Dispense Auth. Provider   amLODipine (NORVASC) 5 MG tablet Take 1 tablet (5 mg total) by mouth daily. 30 tablet Nonie Lochner K, [place]   colchicine 0.6 MG tablet Take 1 tablet (0.6 mg total) by mouth 2 (two) times daily. 30 tablet Hakop Humbarger K, [place]   diclofenac (VOLTAREN) 75 MG EC tablet Take 1 tablet (75 mg total) by mouth 2 (two) times daily. 20 tablet Fransico Meadow, [place]     Controlled Substance Prescriptions [place] Controlled Substance Registry consulted? Not Applicable   Fransico Meadow, [place] 11/02/18 3893

## 2018-11-02 NOTE — Discharge Instructions (Signed)
Return if any problems.

## 2018-11-03 ENCOUNTER — Telehealth (HOSPITAL_COMMUNITY): Payer: Self-pay | Admitting: Family Medicine

## 2018-11-03 MED ORDER — COLCHICINE 0.6 MG PO TABS: 0.6000 mg | ORAL_TABLET | Freq: Two times a day (BID) | ORAL | 1 refills | Status: DC

## 2018-11-03 MED ORDER — DICLOFENAC SODIUM 75 MG PO TBEC: 75.0000 mg | DELAYED_RELEASE_TABLET | Freq: Two times a day (BID) | ORAL | 0 refills | Status: DC

## 2018-11-03 MED ORDER — AMLODIPINE BESYLATE 5 MG PO TABS: 5.0000 mg | ORAL_TABLET | Freq: Every day | ORAL | 1 refills | Status: DC

## 2018-11-03 NOTE — Telephone Encounter (Signed)
Sent pt medications to [facility] as requested.

## 2018-11-19 ENCOUNTER — Ambulatory Visit: Payer: Self-pay | Admitting: Family Medicine

## 2019-05-25 ENCOUNTER — Ambulatory Visit (HOSPITAL_COMMUNITY)
Admission: EM | Admit: 2019-05-25 | Discharge: 2019-05-25 | Disposition: A | Discharge status: Home / Self Care | Payer: Self-pay | Attending: Family Medicine | Admitting: Family Medicine

## 2019-05-25 ENCOUNTER — Ambulatory Visit (INDEPENDENT_AMBULATORY_CARE_PROVIDER_SITE_OTHER): Payer: Self-pay

## 2019-05-25 ENCOUNTER — Encounter (HOSPITAL_COMMUNITY): Payer: Self-pay | Admitting: Emergency Medicine

## 2019-05-25 ENCOUNTER — Other Ambulatory Visit: Payer: Self-pay

## 2019-05-25 DIAGNOSIS — B9689 Other specified bacterial agents as the cause of diseases classified elsewhere: Secondary | ICD-10-CM

## 2019-05-25 DIAGNOSIS — G8929 Other chronic pain: Secondary | ICD-10-CM

## 2019-05-25 DIAGNOSIS — N760 Acute vaginitis: Secondary | ICD-10-CM

## 2019-05-25 DIAGNOSIS — M1712 Unilateral primary osteoarthritis, left knee: Secondary | ICD-10-CM

## 2019-05-25 DIAGNOSIS — M25561 Pain in right knee: Secondary | ICD-10-CM

## 2019-05-25 MED ORDER — METRONIDAZOLE 500 MG PO TABS: 500.0000 mg | ORAL_TABLET | Freq: Two times a day (BID) | ORAL | 0 refills | Status: DC

## 2019-05-25 NOTE — Discharge Instructions (Addendum)
Take Flagyl as prescribed. Return if symptoms worsen, do not improve. Important to establish primary care due to elevated blood pressure. Contact evaluation provided for Ortho, recommend following up to do severity of osteoarthritis.  Patient to take OTC Aleve and Tylenol for pain relief, will try compression brace if able to find size that fits her leg

## 2019-05-25 NOTE — ED Notes (Signed)
Patient and mother being seen in the same treatment room.  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  Located treatment room empty.  No comment made to staff on exit from building.  Patient had singled out a staff member and made inappropriate statements to as witnessed and reported by staff, this staff member had not been involved in care.  Will call patient and review discharge instructions and any concerns

## 2019-05-25 NOTE — ED Triage Notes (Signed)
Per pt she has been having knee pain for over 1 year and now it has been getting worse and having to limp. No swelling

## 2019-05-25 NOTE — ED Provider Notes (Signed)
Vickie Patel    CSN: [id number] Arrival date & time: 05/25/19  1011     History   Chief Complaint Chief Complaint  Patient presents with  . Knee Pain    left knee    HPI Vickie Patel is a 48 y.o. female with history of morbid obesity, hypertension presenting for chronic left knee pain.  Patient states this is been ongoing for over a year, feels it slowly been getting worse.  Patient has never had imaging done of this knee, requesting today.  Patient states that sometimes she limps, worse with prolonged standing.  Patient states that she has arthritis in her right knee.  Takes take Chlophed neck as needed with moderate relief of symptoms.  Patient does not have a PCP, though states "I am working on establishing care right now ".  Patient's blood pressure elevated in office today: Denies chest pain, headache, change in vision, nominal pain, claudication. Patient also notes over 2-week history of increased vaginal discharge or malodor.  Patient has history of BV, feels it is consistent with this.  Patient not currently sexually active denies pelvic/vaginal pain, bleeding, urinary frequency/urgency.    Past Medical History:  Diagnosis Date  . Anemia   . Blood transfusion without reported diagnosis   . Constipation, chronic   . Enlarged heart   . Hypertension   . Uterine fibroid     Patient Active Problem List   Diagnosis Date Noted  . Essential hypertension 08/06/2015  . Hypertensive heart disease 08/06/2015  . Morbid obesity ([place]) 08/06/2015  . Cardiac murmur 08/06/2015    Past Surgical History:  Procedure Laterality Date  . TUBAL LIGATION      OB History    Gravida  4   Para  4   Term      Preterm      AB      Living  4     SAB      TAB      Ectopic      Multiple      Live Births               Home Medications    Prior to Admission medications   Medication Sig Start Date End Date Taking? Authorizing Provider  amLODipine  (NORVASC) 5 MG tablet Take 1 tablet (5 mg total) by mouth daily. 11/03/18   Loura Halt A, NP  aspirin EC 81 MG tablet Take 81 mg by mouth daily.    [provider]  colchicine 0.6 MG tablet Take 1 tablet (0.6 mg total) by mouth 2 (two) times daily. 11/03/18   Loura Halt A, NP  diclofenac (VOLTAREN) 75 MG EC tablet Take 1 tablet (75 mg total) by mouth 2 (two) times daily. 11/03/18   Loura Halt A, NP  metroNIDAZOLE (FLAGYL) 500 MG tablet Take 1 tablet (500 mg total) by mouth 2 (two) times daily. 05/25/19   Hall-Potvin, [place], PA-C  tobramycin (TOBREX) 0.3 % ophthalmic solution Place 1 drop into both eyes every 4 (four) hours. 10/19/18   Raylene Everts, MD    Family History Family History  Problem Relation Age of Onset  . Hypertension Mother     Social History Social History   Tobacco Use  . Smoking status: Current Some Day Smoker    Packs/day: 0.25    Types: Cigarettes    Start date: 10/07/2015  . Smokeless tobacco: Current User  . Tobacco comment: 0.5 packs per week  Substance Use  Topics  . Alcohol use: Yes    Alcohol/week: 0.0 standard drinks    Comment: 1 pint per week  . Drug use: No     Allergies   Penicillins   Review of Systems As per HPI   Physical Exam Triage Vital Signs ED Triage Vitals  Enc Vitals Group     BP 05/25/19 1038 (!) 166/84     Pulse Rate 05/25/19 1038 61     Resp 05/25/19 1038 16     Temp 05/25/19 1054 98.1 F (36.7 C)     Temp Source 05/25/19 1054 Oral     SpO2 05/25/19 1038 100 %     Weight --      Height --      Head Circumference --      Peak Flow --      Pain Score 05/25/19 1036 10     Pain Loc --      Pain Edu? --      Excl. in [place]? --    No data found.  Updated Vital Signs BP (!) 166/84 (BP Location: Right Arm)   Pulse 61   Temp 98.1 F (36.7 C) (Oral)   Resp 16   SpO2 100%   Visual Acuity Right Eye Distance:   Left Eye Distance:   Bilateral Distance:    Right Eye Near:   Left Eye Near:     Bilateral Near:     Physical Exam Constitutional:      General: She is not in acute distress.    Appearance: She is obese.  HENT:     Head: Normocephalic and atraumatic.  Eyes:     General: No scleral icterus.    Pupils: Pupils are equal, round, and reactive to light.  Cardiovascular:     Rate and Rhythm: Normal rate.  Pulmonary:     Effort: Pulmonary effort is normal.  Musculoskeletal:     Comments: Exam limited second to patient's habitus.  No bony tenderness, erythema, or effusion appreciated.  Patient has full active range of motion, able to bear weight with some pain.  Gait is slightly antalgic favoring left with a weight distribution is even throughout stride.  Patellar reflex 2+ bilaterally.  Strength 5/5 bilaterally in knee and ankle.  Skin:    Coloration: Skin is not jaundiced or pale.  Neurological:     Mental Status: She is alert and oriented to person, place, and time.      UC Treatments / Results  Labs (all labs ordered are listed, but only abnormal results are displayed) Labs Reviewed - No data to display  EKG None  Radiology Dg Knee 2 Views Left  Result Date: 05/25/2019 CLINICAL DATA:  Chronic knee pain, worsening. EXAM: LEFT KNEE - 1-2 VIEW COMPARISON:  01/09/2010 FINDINGS: Standing views of the left knee were obtained. There is complete loss of joint space in the medial knee compartment with associated osteophytosis. Negative for fracture, dislocation or large joint effusion. Prominent degenerative changes at the patellofemoral compartment of the knee. Varus deformity of the left knee related to the medial joint space narrowing. IMPRESSION: Left knee osteoarthritis with severe disease in the medial knee compartment. No acute bone abnormality. Electronically Signed   By: Markus Daft M.D.   On: 05/25/2019 12:12    Procedures Procedures (including critical care time)  Medications Ordered in UC Medications - No data to display  Initial Impression / Assessment  and Plan / UC Course  I have reviewed the triage vital  signs and the nursing notes.  Pertinent labs & imaging results that were available during my care of the patient were reviewed by me and considered in my medical decision making (see chart for details).     48 year old female with history of morbid obesity and hypertension presenting for chronic left knee pain.  Exam limited secondary to patient habitus.  X-ray done in office, reviewed by radiology: "Left knee osteoarthritis with severe disease in the medial knee compartment. No acute bone abnormality".  Patient to establish care with PCP.  Patient's blood pressure elevated: Patient asymptomatic, will discuss this with PCP.  Patient also having symptoms consistent with previous BV: We will treat with Flagyl.  Patient currently sexually active/declining STI screening.    Final Clinical Impressions(s) / UC Diagnoses   Final diagnoses:  BV (bacterial vaginosis)  Primary osteoarthritis of left knee   Discharge Instructions   None    ED Prescriptions    Medication Sig Dispense Auth. Provider   metroNIDAZOLE (FLAGYL) 500 MG tablet Take 1 tablet (500 mg total) by mouth 2 (two) times daily. 14 tablet Hall-Potvin, [place], PA-C     Controlled Substance Prescriptions  Controlled Substance Registry consulted? Not Applicable   Quincy Sheehan, [place] 05/25/19 1417

## 2019-08-12 ENCOUNTER — Other Ambulatory Visit: Payer: Self-pay

## 2019-08-12 ENCOUNTER — Ambulatory Visit (HOSPITAL_COMMUNITY)
Admission: EM | Admit: 2019-08-12 | Discharge: 2019-08-12 | Disposition: A | Discharge status: Home / Self Care | Payer: Self-pay | Attending: Family Medicine | Admitting: Family Medicine

## 2019-08-12 ENCOUNTER — Encounter (HOSPITAL_COMMUNITY): Payer: Self-pay | Admitting: Emergency Medicine

## 2019-08-12 DIAGNOSIS — M109 Gout, unspecified: Secondary | ICD-10-CM

## 2019-08-12 MED ORDER — PREDNISONE 10 MG (21) PO TBPK: ORAL_TABLET | Freq: Every day | ORAL | 0 refills | Status: DC

## 2019-08-12 NOTE — Discharge Instructions (Addendum)
Drink plenty of water Take the prednisone as directed.  Take all of day 1 today. When you have finished the prednisone if you need pain medicine, take ibuprofen 800 mg, or Aleve 2 tablets for pain limit walking on foot while it is painful Read the information on the low purine diet.  Limit foods that stimulate gout

## 2019-08-12 NOTE — ED Triage Notes (Signed)
Patient has pain in right great toe and foot.  Onset 4 days ago and has worsened every day.  No known injury  Redness and swelling at joint at base of right great toe

## 2019-08-12 NOTE — ED Provider Notes (Signed)
[facility]    CSN: [id number] Arrival date & time: 08/12/19  1202      History   Chief Complaint Chief Complaint  Patient presents with  . Foot Pain    HPI Vickie Patel is a 48 y.o. female.   HPI  History of gout Out of gout medicine Toe has been swollen and increasingly painful for the last 3 days No fever No trauma  Past Medical History:  Diagnosis Date  . Anemia   . Blood transfusion without reported diagnosis   . Constipation, chronic   . Enlarged heart   . Hypertension   . Uterine fibroid     Patient Active Problem List   Diagnosis Date Noted  . Essential hypertension 08/06/2015  . Hypertensive heart disease 08/06/2015  . Morbid obesity ([place]) 08/06/2015  . Cardiac murmur 08/06/2015    Past Surgical History:  Procedure Laterality Date  . TUBAL LIGATION      OB History    Gravida  4   Para  4   Term      Preterm      AB      Living  4     SAB      TAB      Ectopic      Multiple      Live Births               Home Medications    Prior to Admission medications   Medication Sig Start Date End Date Taking? Authorizing Provider  aspirin EC 81 MG tablet Take 81 mg by mouth daily.    [provider]  predniSONE (STERAPRED UNI-PAK 21 TAB) 10 MG (21) TBPK tablet Take by mouth daily. tad 08/12/19   Raylene Everts, MD  amLODipine (NORVASC) 5 MG tablet Take 1 tablet (5 mg total) by mouth daily. 11/03/18 08/12/19  Loura Halt A, NP  colchicine 0.6 MG tablet Take 1 tablet (0.6 mg total) by mouth 2 (two) times daily. 11/03/18 08/12/19  Orvan July, NP    Family History Family History  Problem Relation Age of Onset  . Hypertension Mother     Social History Social History   Tobacco Use  . Smoking status: Current Some Day Smoker    Packs/day: 0.25    Types: Cigarettes    Start date: 10/07/2015  . Smokeless tobacco: Current User  . Tobacco comment: 0.5 packs per week  Substance Use Topics  . Alcohol  use: Yes    Alcohol/week: 0.0 standard drinks    Comment: 1 pint per week  . Drug use: No     Allergies   Penicillins   Review of Systems Review of Systems  Musculoskeletal: Positive for arthralgias and gait problem.     Physical Exam Triage Vital Signs ED Triage Vitals  Enc Vitals Group     BP 08/12/19 1346 (!) 196/109     Pulse Rate 08/12/19 1346 65     Resp 08/12/19 1346 (!) 22     Temp 08/12/19 1346 98.1 F (36.7 C)     Temp Source 08/12/19 1346 Oral     SpO2 08/12/19 1346 99 %     Weight --      Height --      Head Circumference --      Peak Flow --      Pain Score 08/12/19 1342 10     Pain Loc --      Pain Edu? --  Excl. in [place]? --    No data found.  Updated Vital Signs BP (!) 196/109 (BP Location: Left Arm) Comment (BP Location): regular cuff on left forearm  Pulse 65   Temp 98.1 F (36.7 C) (Oral)   Resp (!) 22   LMP 06/12/2019   SpO2 99%  Patient states that her blood pressure is up because of the pain because she did not sleep last night.  She states it is usually under better control. Visual Acuity    Physical Exam Constitutional:      General: She is not in acute distress.    Appearance: She is well-developed.  HENT:     Head: Normocephalic and atraumatic.  Eyes:     Conjunctiva/sclera: Conjunctivae normal.     Pupils: Pupils are equal, round, and reactive to light.  Neck:     Musculoskeletal: Normal range of motion.  Cardiovascular:     Rate and Rhythm: Normal rate.  Pulmonary:     Effort: Pulmonary effort is normal. No respiratory distress.  Abdominal:     General: There is no distension.     Palpations: Abdomen is soft.  Musculoskeletal: Normal range of motion.     Comments: First MTP on the right foot is swollen, red, exquisitely tender around the joint.  Distal neurovascular is intact  Skin:    General: Skin is warm and dry.  Neurological:     Mental Status: She is alert.      UC Treatments / Results  Labs (all labs  ordered are listed, but only abnormal results are displayed) Labs Reviewed - No data to display  EKG   Radiology No results found.  Procedures Procedures (including critical care time)  Medications Ordered in UC Medications - No data to display  Initial Impression / Assessment and Plan / UC Course  I have reviewed the triage vital signs and the nursing notes.  Pertinent labs & imaging results that were available during my care of the patient were reviewed by me and considered in my medical decision making (see chart for details).    Classic podagra, gout.  Patient has received colchicine in the past with good results but she does not have insurance and states she is financially strapped.  The colchicine, even with discounts, is in the $60 range.  I am giving her a Sterapred pack instead  Final Clinical Impressions(s) / UC Diagnoses   Final diagnoses:  Acute gout involving toe of right foot, unspecified cause     Discharge Instructions     Drink plenty of water Take the prednisone as directed.  Take all of day 1 today. When you have finished the prednisone if you need pain medicine, take ibuprofen 800 mg, or Aleve 2 tablets for pain limit walking on foot while it is painful Read the information on the low purine diet.  Limit foods that stimulate gout    ED Prescriptions    Medication Sig Dispense Auth. Provider   predniSONE (STERAPRED UNI-PAK 21 TAB) 10 MG (21) TBPK tablet Take by mouth daily. tad 21 tablet Raylene Everts, MD     Controlled Substance Prescriptions  Controlled Substance Registry consulted? Not Applicable   Raylene Everts, MD 08/12/19 1807

## 2019-09-17 ENCOUNTER — Telehealth: Payer: Self-pay | Admitting: Cardiovascular Disease

## 2019-09-17 NOTE — Telephone Encounter (Signed)
Virtual Visit Pre-Appointment Phone Call  "(Name), I am calling you today to discuss your upcoming appointment. We are currently trying to limit exposure to the virus that causes COVID-19 by seeing patients at home rather than in the office."  1. "What is the BEST phone number to call the day of the visit?" - include this in appointment notes  2. Do you have or have access to (through a family member/friend) a smartphone with video capability that we can use for your visit?" a. If yes - list this number in appt notes as cell (if different from BEST phone #) and list the appointment type as a VIDEO visit in appointment notes b. If no - list the appointment type as a PHONE visit in appointment notes  3. Confirm consent - "In the setting of the current Covid19 crisis, you are scheduled for a (phone or video) visit with your provider on (date) at (time).  Just as we do with many in-office visits, in order for you to participate in this visit, we must obtain consent.  If you'd like, I can send this to your mychart (if signed up) or email for you to review.  Otherwise, I can obtain your verbal consent now.  All virtual visits are billed to your insurance company just like a normal visit would be.  By agreeing to a virtual visit, we'd like you to understand that the technology does not allow for your provider to perform an examination, and thus may limit your provider's ability to fully assess your condition. If your provider identifies any concerns that need to be evaluated in person, we will make arrangements to do so.  Finally, though the technology is pretty good, we cannot assure that it will always work on either your or our end, and in the setting of a video visit, we may have to convert it to a phone-only visit.  In either situation, we cannot ensure that we have a secure connection.  Are you willing to proceed?" STAFF: Did the patient verbally acknowledge consent to telehealth visit? Document  YES/NO here: Yes  4. Advise patient to be prepared - "Two hours prior to your appointment, go ahead and check your blood pressure, pulse, oxygen saturation, and your weight (if you have the equipment to check those) and write them all down. When your visit starts, your provider will ask you for this information. If you have an Apple Watch or Kardia device, please plan to have heart rate information ready on the day of your appointment. Please have a pen and paper handy nearby the day of the visit as well."  5. Give patient instructions for MyChart download to smartphone OR [email or web address] as below if video visit (depending on what platform provider is using)  6. Inform patient they will receive a phone call 15 minutes prior to their appointment time (may be from unknown caller ID) so they should be prepared to answer    [facility] has been deemed a candidate for a follow-up tele-health visit to limit community exposure during the Covid-19 pandemic. I spoke with the patient via phone to ensure availability of phone/video source, confirm preferred email & phone number, and discuss instructions and expectations.  I reminded Raeonna M Noyce to be prepared with any vital sign and/or heart rhythm information that could potentially be obtained via home monitoring, at the time of her visit. I reminded Apollonia Radcliff Ryther to expect a phone call prior to  her visit.  Therisa Doyne 09/17/2019 1:45 PM    FULL LENGTH CONSENT FOR TELE-HEALTH VISIT   I hereby voluntarily request, consent and authorize [facility] and its employed or contracted physicians, physician assistants, nurse practitioners or other licensed health care professionals (the Practitioner), to provide me with telemedicine health care services (the Services") as deemed necessary by the treating Practitioner. I acknowledge and consent to receive the Services by the Practitioner via telemedicine. I understand that  the telemedicine visit will involve communicating with the Practitioner through live audiovisual communication technology and the disclosure of certain medical information by electronic transmission. I acknowledge that I have been given the opportunity to request an in-person assessment or other available alternative prior to the telemedicine visit and am voluntarily participating in the telemedicine visit.  I understand that I have the right to withhold or withdraw my consent to the use of telemedicine in the course of my care at any time, without affecting my right to future care or treatment, and that the Practitioner or I may terminate the telemedicine visit at any time. I understand that I have the right to inspect all information obtained and/or recorded in the course of the telemedicine visit and may receive copies of available information for a reasonable fee.  I understand that some of the potential risks of receiving the Services via telemedicine include:   Delay or interruption in medical evaluation due to technological equipment failure or disruption;  Information transmitted may not be sufficient (e.g. poor resolution of images) to allow for appropriate medical decision making by the Practitioner; and/or   In rare instances, security protocols could fail, causing a breach of personal health information.  Furthermore, I acknowledge that it is my responsibility to provide information about my medical history, conditions and care that is complete and accurate to the best of my ability. I acknowledge that Practitioner's advice, recommendations, and/or decision may be based on factors not within their control, such as incomplete or inaccurate data provided by me or distortions of diagnostic images or specimens that may result from electronic transmissions. I understand that the practice of medicine is not an exact science and that Practitioner makes no warranties or guarantees regarding treatment  outcomes. I acknowledge that I will receive a copy of this consent concurrently upon execution via email to the email address I last provided but may also request a printed copy by calling the office of [facility].    I understand that my insurance will be billed for this visit.   I have read or had this consent read to me.  I understand the contents of this consent, which adequately explains the benefits and risks of the Services being provided via telemedicine.   I have been provided ample opportunity to ask questions regarding this consent and the Services and have had my questions answered to my satisfaction.  I give my informed consent for the services to be provided through the use of telemedicine in my medical care  By participating in this telemedicine visit I agree to the above.

## 2019-09-20 ENCOUNTER — Telehealth (INDEPENDENT_AMBULATORY_CARE_PROVIDER_SITE_OTHER): Payer: Self-pay | Admitting: Cardiovascular Disease

## 2019-09-20 NOTE — Progress Notes (Signed)
No show--erroneous encounter 

## 2019-10-14 ENCOUNTER — Ambulatory Visit (HOSPITAL_COMMUNITY)
Admission: EM | Admit: 2019-10-14 | Discharge: 2019-10-14 | Disposition: A | Discharge status: Home / Self Care | Payer: Self-pay | Attending: Family Medicine | Admitting: Family Medicine

## 2019-10-14 ENCOUNTER — Encounter (HOSPITAL_COMMUNITY): Payer: Self-pay

## 2019-10-14 ENCOUNTER — Telehealth (HOSPITAL_COMMUNITY): Payer: Self-pay | Admitting: Emergency Medicine

## 2019-10-14 ENCOUNTER — Other Ambulatory Visit: Payer: Self-pay

## 2019-10-14 DIAGNOSIS — I10 Essential (primary) hypertension: Secondary | ICD-10-CM

## 2019-10-14 DIAGNOSIS — M109 Gout, unspecified: Secondary | ICD-10-CM

## 2019-10-14 MED ORDER — PREDNISONE 10 MG (21) PO TBPK: ORAL_TABLET | Freq: Every day | ORAL | 0 refills | Status: DC

## 2019-10-14 MED ORDER — AMLODIPINE BESYLATE 5 MG PO TABS: 5.0000 mg | ORAL_TABLET | Freq: Every day | ORAL | 0 refills | Status: DC

## 2019-10-14 NOTE — ED Triage Notes (Signed)
Patient presents to Urgent Care with complaints of gout in right foot since the past weekend after drinking one corona. Patient reports she misplaced her medicine that she normally takes for her gout.

## 2019-10-14 NOTE — Discharge Instructions (Signed)
Complete prednisone pack as prescribed.  Please restart amlodipine daily for your blood pressure.  Please establish with a primary care provider for recheck of your blood pressure and management as needed.  Ice, elevation to help with pain as well. See diet which may help with gout prevention.

## 2019-10-14 NOTE — ED Provider Notes (Signed)
[place]    CSN: [id number] Arrival date & time: 10/14/19  1553      History   Chief Complaint Chief Complaint  Patient presents with  . Gout    OUTSIDE WAITING    HPI Vickie Patel is a 48 y.o. female.   Vickie Patel presents with complaints of pain to right great toe MTP joint which started over the past 2 days. Similar to gout she has had in the past. She had drank one corona beer prior to onset. States lately alcohol has been triggering her gout. Hasn't taken any medication for pain. She is on her feet a lot. She does not have a PCP therefore hasn't been taking any medications for her BP. No headache, chest pain , vision changes or swelling. No fevers. No other injury to foot or toe.    ROS per HPI, negative if not otherwise mentioned.      Past Medical History:  Diagnosis Date  . Anemia   . Blood transfusion without reported diagnosis   . Constipation, chronic   . Enlarged heart   . Hypertension   . Uterine fibroid     Patient Active Problem List   Diagnosis Date Noted  . Essential hypertension 08/06/2015  . Hypertensive heart disease 08/06/2015  . Morbid obesity ([place]) 08/06/2015  . Cardiac murmur 08/06/2015    Past Surgical History:  Procedure Laterality Date  . TUBAL LIGATION      OB History    Gravida  4   Para  4   Term      Preterm      AB      Living  4     SAB      TAB      Ectopic      Multiple      Live Births               Home Medications    Prior to Admission medications   Medication Sig Start Date End Date Taking? Authorizing Provider  amLODipine (NORVASC) 5 MG tablet Take 1 tablet (5 mg total) by mouth daily. 10/14/19   Zigmund Gottron, NP  aspirin EC 81 MG tablet Take 81 mg by mouth daily.    [provider]  predniSONE (STERAPRED UNI-PAK 21 TAB) 10 MG (21) TBPK tablet Take by mouth daily. tad 10/14/19   Augusto Gamble B, NP  colchicine 0.6 MG tablet Take 1 tablet (0.6 mg total)  by mouth 2 (two) times daily. 11/03/18 08/12/19  Orvan July, NP    Family History Family History  Problem Relation Age of Onset  . Hypertension Mother   . Heart failure Mother   . Alzheimer's disease Mother     Social History Social History   Tobacco Use  . Smoking status: Current Some Day Smoker    Packs/day: 0.25    Types: Cigarettes    Start date: 10/07/2015  . Smokeless tobacco: Current User  . Tobacco comment: 0.5 packs per week  Substance Use Topics  . Alcohol use: Yes    Alcohol/week: 0.0 standard drinks    Comment: 1 pint per week  . Drug use: No     Allergies   Penicillins   Review of Systems Review of Systems   Physical Exam Triage Vital Signs ED Triage Vitals  Enc Vitals Group     BP 10/14/19 1654 (!) 204/88     Pulse Rate 10/14/19 1654 65  Resp 10/14/19 1654 16     Temp 10/14/19 1654 98.9 F (37.2 C)     Temp Source 10/14/19 1654 Temporal     SpO2 10/14/19 1654 100 %     Weight --      Height --      Head Circumference --      Peak Flow --      Pain Score 10/14/19 1649 8     Pain Loc --      Pain Edu? --      Excl. in [place]? --    No data found.  Updated Vital Signs BP (!) 204/88 (BP Location: Right Wrist) Comment: taken on WRIST  Pulse 65   Temp 98.9 F (37.2 C) (Temporal)   Resp 16   SpO2 100%    Physical Exam Constitutional:      General: She is not in acute distress.    Appearance: She is well-developed.  Cardiovascular:     Rate and Rhythm: Normal rate.  Pulmonary:     Effort: Pulmonary effort is normal.  Musculoskeletal:     Right ankle: Normal.     Right foot: Normal range of motion and normal capillary refill. Tenderness, bony tenderness and swelling present. No crepitus, deformity or laceration.     Comments: Redness tenderness at right great toe MTP joint; strong pedal pulse; cap refill < 2 seconds    Skin:    General: Skin is warm and dry.  Neurological:     Mental Status: She is alert and oriented to person,  place, and time.      UC Treatments / Results  Labs (all labs ordered are listed, but only abnormal results are displayed) Labs Reviewed - No data to display  EKG   Radiology No results found.  Procedures Procedures (including critical care time)  Medications Ordered in UC Medications - No data to display  Initial Impression / Assessment and Plan / UC Course  I have reviewed the triage vital signs and the nursing notes.  Pertinent labs & imaging results that were available during my care of the patient were reviewed by me and considered in my medical decision making (see chart for details).     Findings and history remains consistent with gout. Prednisone pack has been effective in the past, refilled today. Amlodipine refilled as well, has been on this in the past. Emphasized follow up with a PCP for long term management of BP. Return precautions provided. Patient verbalized understanding and agreeable to plan.   Final Clinical Impressions(s) / UC Diagnoses   Final diagnoses:  Acute gout involving toe of right foot, unspecified cause     Discharge Instructions     Complete prednisone pack as prescribed.  Please restart amlodipine daily for your blood pressure.  Please establish with a primary care provider for recheck of your blood pressure and management as needed.  Ice, elevation to help with pain as well. See diet which may help with gout prevention.    ED Prescriptions    Medication Sig Dispense Auth. Provider   predniSONE (STERAPRED UNI-PAK 21 TAB) 10 MG (21) TBPK tablet Take by mouth daily. tad 21 tablet Augusto Gamble B, NP   amLODipine (NORVASC) 5 MG tablet Take 1 tablet (5 mg total) by mouth daily. 30 tablet Zigmund Gottron, NP     PDMP not reviewed this encounter.   Zigmund Gottron, NP 10/14/19 1721

## 2019-10-14 NOTE — Telephone Encounter (Signed)
Pt called requesting refil on her steroids due to gout attack. Pt informed she will need to be seen by a provider before we can treat her symptoms. Pt insistent that "Yall have done this before, you should just be able to send it in for me". Pt was last seen in august, explained again to patient that we are not a primary office and we do not do refills of medicines. She will need to come here and be evaluated or call her PCP for a refill.

## 2019-11-29 ENCOUNTER — Ambulatory Visit: Payer: Self-pay | Admitting: Family Medicine

## 2020-01-06 ENCOUNTER — Encounter (HOSPITAL_COMMUNITY): Payer: Self-pay | Admitting: Emergency Medicine

## 2020-01-06 ENCOUNTER — Ambulatory Visit (HOSPITAL_COMMUNITY)
Admission: EM | Admit: 2020-01-06 | Discharge: 2020-01-06 | Discharge status: Against Medical Advice | Payer: Self-pay | Attending: Family Medicine | Admitting: Family Medicine

## 2020-01-06 ENCOUNTER — Other Ambulatory Visit: Payer: Self-pay

## 2020-01-06 NOTE — ED Triage Notes (Signed)
PT reports bilateral leg pain and numbness. It is worse when she needs to urinate, but improves after emptying bladder. Denies known back injury. No low back pain.

## 2020-01-06 NOTE — ED Notes (Signed)
Upon being called back to room, patient is angry that her husband was called before her. Demands to speak to manager, Latrelle Dodrill taken to room to speak with patient.

## 2020-01-06 NOTE — ED Notes (Signed)
PT elects to leave.

## 2020-01-28 ENCOUNTER — Ambulatory Visit: Payer: Self-pay | Admitting: *Deleted

## 2020-01-28 NOTE — Telephone Encounter (Signed)
Son is calling to report that mother is leg numbness and coughing up blood. Mother's boyfriend has contacted him to let him know his mother is having symptoms- he is not with her at this time. Reason for Disposition . [1] Numbness (i.e., loss of sensation) of the face, arm / hand, or leg / foot on one side of the body AND [2] sudden onset AND [3] present now  Answer Assessment - Initial Assessment Questions 1. SYMPTOM: "What is the main symptom you are concerned about?" (e.g., weakness, numbness)     Leg numbness- patient has to hit- massage her leg in order to get up.  7. OTHER SYMPTOMS: "Do you have any other symptoms?"     Coughing up blood  Protocols used: NEUROLOGIC DEFICIT-A-AH

## 2020-03-30 ENCOUNTER — Ambulatory Visit (HOSPITAL_COMMUNITY)
Admission: EM | Admit: 2020-03-30 | Discharge: 2020-03-30 | Disposition: A | Discharge status: Home / Self Care | Payer: Self-pay | Attending: Family Medicine | Admitting: Family Medicine

## 2020-03-30 ENCOUNTER — Encounter (HOSPITAL_COMMUNITY): Payer: Self-pay | Admitting: Emergency Medicine

## 2020-03-30 ENCOUNTER — Other Ambulatory Visit: Payer: Self-pay

## 2020-03-30 DIAGNOSIS — M79661 Pain in right lower leg: Secondary | ICD-10-CM

## 2020-03-30 LAB — POCT URINALYSIS DIP (DEVICE)
Bilirubin Urine: NEGATIVE
Glucose, UA: NEGATIVE mg/dL
Ketones, ur: NEGATIVE mg/dL
Leukocytes,Ua: NEGATIVE
Nitrite: NEGATIVE
Protein, ur: >=300 mg/dL — AB
Specific Gravity, Urine: >=1.03 (ref 1.005–1.030)
Urobilinogen, UA: 0.2 mg/dL (ref 0.0–1.0)
pH: 6 (ref 5.0–8.0)

## 2020-03-30 MED ORDER — AMLODIPINE BESYLATE 10 MG PO TABS: 10.0000 mg | ORAL_TABLET | Freq: Every day | ORAL | 0 refills | Status: DC

## 2020-03-30 NOTE — Discharge Instructions (Addendum)
Please follow up to have your calf pain evaluated Please be seen in the emergency department if you pain gets worse  Please have your blood pressure checked.

## 2020-03-30 NOTE — ED Provider Notes (Signed)
[place]    CSN: [id number] Arrival date & time: 03/30/20  1641      History   Chief Complaint Chief Complaint  Patient presents with  . Leg Pain    HPI Vickie Patel is a 49 y.o. female.   She is presenting with pain in the right posterior calf.  Also with dysuria.  The symptoms of her leg pain has been ongoing for a period of time.  Denies any contraceptives.  She smokes when she drinks alcohol.  No history of blood clots.  Her blood pressure is significantly elevated this is something that is chronic for her.  She reports dysuria that is occurred over the past week.  No report of sexual transmitted infection.  HPI  Past Medical History:  Diagnosis Date  . Anemia   . Blood transfusion without reported diagnosis   . Constipation, chronic   . Enlarged heart   . Hypertension   . Uterine fibroid     Patient Active Problem List   Diagnosis Date Noted  . Essential hypertension 08/06/2015  . Hypertensive heart disease 08/06/2015  . Morbid obesity ([place]) 08/06/2015  . Cardiac murmur 08/06/2015    Past Surgical History:  Procedure Laterality Date  . TUBAL LIGATION      OB History    Gravida  4   Para  4   Term      Preterm      AB      Living  4     SAB      TAB      Ectopic      Multiple      Live Births               Home Medications    Prior to Admission medications   Medication Sig Start Date End Date Taking? Authorizing Provider  amLODipine (NORVASC) 10 MG tablet Take 1 tablet (10 mg total) by mouth daily. 03/30/20   Rosemarie Ax, MD  aspirin EC 81 MG tablet Take 81 mg by mouth daily.    [provider]  predniSONE (STERAPRED UNI-PAK 21 TAB) 10 MG (21) TBPK tablet Take by mouth daily. tad Patient not taking: Reported on 03/30/2020 10/14/19   Augusto Gamble B, NP  colchicine 0.6 MG tablet Take 1 tablet (0.6 mg total) by mouth 2 (two) times daily. 11/03/18 08/12/19  Orvan July, NP    Family History Family  History  Problem Relation Age of Onset  . Hypertension Mother   . Heart failure Mother   . Alzheimer's disease Mother     Social History Social History   Tobacco Use  . Smoking status: Current Some Day Smoker    Packs/day: 0.25    Types: Cigarettes    Start date: 10/07/2015  . Smokeless tobacco: Current User  . Tobacco comment: 0.5 packs per week  Substance Use Topics  . Alcohol use: Yes    Alcohol/week: 0.0 standard drinks    Comment: 1 pint per week  . Drug use: No     Allergies   Penicillins   Review of Systems Review of Systems See HPI  Physical Exam Triage Vital Signs ED Triage Vitals  Enc Vitals Group     BP 03/30/20 1736 (!) 218/110     Pulse Rate 03/30/20 1736 65     Resp 03/30/20 1736 18     Temp 03/30/20 1736 98 F (36.7 C)     Temp Source 03/30/20 1736 Oral  SpO2 03/30/20 1736 100 %     Weight --      Height --      Head Circumference --      Peak Flow --      Pain Score 03/30/20 1737 8     Pain Loc --      Pain Edu? --      Excl. in [place]? --    No data found.  Updated Vital Signs BP (!) 218/110 (BP Location: Right Arm)   Pulse 65   Temp 98 F (36.7 C) (Oral)   Resp 18   SpO2 100%   Visual Acuity Right Eye Distance:   Left Eye Distance:   Bilateral Distance:    Right Eye Near:   Left Eye Near:    Bilateral Near:     Physical Exam Gen: NAD, alert, cooperative with exam, well-appearing Neuro: normal tone, normal sensation to touch Psych:  normal insight, alert and oriented MSK:  Right calf/leg: Normal flexion extension of the knee. Normal plantarflexion and dorsiflexion. No overt swelling of the lower extremity. No redness or significant swelling of the calf. Neurovascular intact   UC Treatments / Results  Labs (all labs ordered are listed, but only abnormal results are displayed) Labs Reviewed  POCT URINALYSIS DIP (DEVICE) - Abnormal; Notable for the following components:      Result Value   Hgb urine dipstick TRACE  (*)    Protein, ur >=300 (*)    All other components within normal limits    EKG   Radiology No results found.  Procedures Procedures (including critical care time)  Medications Ordered in UC Medications - No data to display  Initial Impression / Assessment and Plan / UC Course  I have reviewed the triage vital signs and the nursing notes.  Pertinent labs & imaging results that were available during my care of the patient were reviewed by me and considered in my medical decision making (see chart for details).     Vickie Patel is a 49 year old female is presenting with dysuria and right calf pain. Urinalysis was demonstrating proteinuria. Likely related to her chronically elevated blood pressure. Counseled on the need for follow-up and provided amlodipine. Right calf pain may be related to strain. Less likely for blood clot. Counseled on supportive care. Given indications to seek immediate care. Patient became tearful during the exam. She feels at a loss with her care and her health. She is taking care of her mother with Alzheimer's. She has difficulty getting to the doctor. She has follow-up with a psychiatrist that is she has been seeing on a regular basis. She reports to drinking alcohol on a regular basis. This was exacerbating her gout. She has tried to cut back. Avoiding anti-inflammatories due to her elevated blood pressure and avoiding pain medications with a history of alcohol abuse. Reassurance provided. Given indications to follow-up.  Final Clinical Impressions(s) / UC Diagnoses   Final diagnoses:  Right calf pain     Discharge Instructions     Please follow up to have your calf pain evaluated Please be seen in the emergency department if you pain gets worse  Please have your blood pressure checked.      ED Prescriptions    Medication Sig Dispense Auth. Provider   amLODipine (NORVASC) 10 MG tablet Take 1 tablet (10 mg total) by mouth daily. 30 tablet Rosemarie Ax, MD     PDMP not reviewed this encounter.   Rosemarie Ax, MD  03/30/20 1930  

## 2020-03-30 NOTE — ED Triage Notes (Signed)
Pt here for right calf pain x 2 months; pt sts difficulty walking due to pain; pt sts out of htn meds x 1 month

## 2020-04-08 ENCOUNTER — Ambulatory Visit (INDEPENDENT_AMBULATORY_CARE_PROVIDER_SITE_OTHER): Payer: Self-pay | Admitting: Family Medicine

## 2020-04-08 ENCOUNTER — Encounter: Payer: Self-pay | Admitting: Family Medicine

## 2020-04-08 ENCOUNTER — Ambulatory Visit: Payer: Self-pay

## 2020-04-08 ENCOUNTER — Other Ambulatory Visit: Payer: Self-pay

## 2020-04-08 VITALS — BP 170/111 | Ht 65.0 in | Wt 300.0 lb

## 2020-04-08 DIAGNOSIS — M79661 Pain in right lower leg: Secondary | ICD-10-CM

## 2020-04-08 NOTE — Progress Notes (Signed)
PCP: Patient, No Pcp Per  Subjective:   HPI: Patient is a 49 y.o. female here for left calf pain.  Patient reports she's had about 6 months of pain throughout her left calf. She went to urgent care for this pain recently and was notified about our office to consider follow-up. She states pain is throughout right calf posteriorly. Difficult walking especially after standing - has to wait a couple minutes before pain eases some for her to be able to walk. Has tried OTC tylenol, NSAIDs without much relief. No shortness of breath.  No pain in right calf. Has not tried any other treatments for this.  Past Medical History:  Diagnosis Date  . Anemia   . Blood transfusion without reported diagnosis   . Constipation, chronic   . Enlarged heart   . Hypertension   . Uterine fibroid     Current Outpatient Medications on File Prior to Visit  Medication Sig Dispense Refill  . amLODipine (NORVASC) 10 MG tablet Take 1 tablet (10 mg total) by mouth daily. 30 tablet 0  . aspirin EC 81 MG tablet Take 81 mg by mouth daily.    . predniSONE (STERAPRED UNI-PAK 21 TAB) 10 MG (21) TBPK tablet Take by mouth daily. tad (Patient not taking: Reported on 03/30/2020) 21 tablet 0  . [DISCONTINUED] colchicine 0.6 MG tablet Take 1 tablet (0.6 mg total) by mouth 2 (two) times daily. 30 tablet 1   No current facility-administered medications on file prior to visit.    Past Surgical History:  Procedure Laterality Date  . TUBAL LIGATION      Allergies  Allergen Reactions  . Penicillins Hives    Childhood reaction unknown    Social History   Socioeconomic History  . Marital status: Legally Separated    Spouse name: Not on file  . Number of children: Not on file  . Years of education: Not on file  . Highest education level: Not on file  Occupational History  . Not on file  Tobacco Use  . Smoking status: Current Some Day Smoker    Packs/day: 0.25    Types: Cigarettes    Start date: 10/07/2015  .  Smokeless tobacco: Current User  . Tobacco comment: 0.5 packs per week  Substance and Sexual Activity  . Alcohol use: Yes    Alcohol/week: 0.0 standard drinks    Comment: 1 pint per week  . Drug use: No  . Sexual activity: Yes    Birth control/protection: Surgical  Other Topics Concern  . Not on file  Social History Narrative  . Not on file   Social Determinants of Health   Financial Resource Strain:   . Difficulty of Paying Living Expenses:   Food Insecurity:   . Worried About Charity fundraiser in the Last Year:   . Arboriculturist in the Last Year:   Transportation Needs:   . Film/video editor (Medical):   Marland Kitchen Lack of Transportation (Non-Medical):   Physical Activity:   . Days of Exercise per Week:   . Minutes of Exercise per Session:   Stress:   . Feeling of Stress :   Social Connections:   . Frequency of Communication with Friends and Family:   . Frequency of Social Gatherings with Friends and Family:   . Attends Religious Services:   . Active Member of Clubs or Organizations:   . Attends Archivist Meetings:   Marland Kitchen Marital Status:   Intimate Partner Violence:   .  Fear of Current or Ex-Partner:   . Emotionally Abused:   Marland Kitchen Physically Abused:   . Sexually Abused:     Family History  Problem Relation Age of Onset  . Hypertension Mother   . Heart failure Mother   . Alzheimer's disease Mother     BP (!) 170/111   Ht 5\' 5"  (1.651 m)   Wt 300 lb (136.1 kg)   BMI 49.92 kg/m   Review of Systems: See HPI above.     Objective:  Physical Exam:  Gen: NAD, comfortable in exam room  Left lower leg: No deformity, pitting edema, other swelling. FROM with 5/5 strength ankle motions but unable to do double or single leg calf raise. Tenderness throughout calf medially and laterally without palpable defect or cords. NVI distally.  Limited MSK u/s left lower leg: Medial and lateral gastroc without defects.  Hypoechoic region noted mid-distal lateral  gastroc.  Small amount of anechoic fluid surrounding both medial and lateral gastroc muscles.  Achilles intact.  Venous structures compressible including popliteal vein.   Assessment & Plan:  1. Left chronic calf strain - Icing, heel lifts.  Start formal physical therapy and home exercises.  Compression sleeve.  F/u in 1 month.  Discussed likelihood this will be a long process to heal.

## 2020-04-08 NOTE — Patient Instructions (Signed)
You have a chronic calf strain Compression sleeve or ace wrap to help with swelling and pain if tolerated. Icing for 15 minutes at a time 3-4 times a day Heel lifts either in temporary orthotics or on their own to prevent further strain. Crutches if needed Tylenol and/or aleve for pain. Start ankle range of motion exercises twice a day (up/down and alphabet exercises). Start physical therapy and do home exercises on days you don't go to therapy. Follow up with me in 1 month.

## 2020-04-21 ENCOUNTER — Encounter: Payer: Self-pay | Admitting: Physical Therapy

## 2020-04-21 ENCOUNTER — Other Ambulatory Visit: Payer: Self-pay

## 2020-04-21 ENCOUNTER — Ambulatory Visit: Payer: Self-pay | Attending: Family Medicine | Admitting: Physical Therapy

## 2020-04-21 DIAGNOSIS — M25671 Stiffness of right ankle, not elsewhere classified: Secondary | ICD-10-CM | POA: Insufficient documentation

## 2020-04-21 DIAGNOSIS — M79661 Pain in right lower leg: Secondary | ICD-10-CM | POA: Insufficient documentation

## 2020-04-21 NOTE — Therapy (Addendum)
[place], [place], [address] Phone: [phone]   Fax:  [phone]  Physical Therapy Evaluation/Discharge  Patient Details  Name: Vickie Patel MRN: [id number] Date of Birth: [birth year] Referring Provider (PT): Dene Gentry, MD    Encounter Date: 04/21/2020  PT End of Session - 04/21/20 1146    Visit Number  1    Number of Visits  9    Date for PT Re-Evaluation  05/22/20    Authorization Type  Self Pay    PT Start Time  1146    PT Stop Time  1234    PT Time Calculation (min)  48 min    Activity Tolerance  Patient tolerated treatment well    Behavior During Therapy  [facility] for tasks assessed/performed       Past Medical History:  Diagnosis Date  . Anemia   . Blood transfusion without reported diagnosis   . Constipation, chronic   . Enlarged heart   . Hypertension   . Uterine fibroid     Past Surgical History:  Procedure Laterality Date  . TUBAL LIGATION      There were no vitals filed for this visit.   Subjective Assessment - 04/21/20 1151    Subjective  "I've been putting a lot of strain on my legs because my toilet is too low. They gave me some pads in my shoe to lift my heels up, but I didn't use them. When I use the bathroom my leg feels better." Patient has gout and believed that pain traveled from feet to legs. Patient reported pain started 6 months ago. Pain gets worse every time she has to go the bathroom. Patient reports that her leg and foot swells and has a bruise that hasn't healed on the left leg. She states that her rt. leg feels like it's going to break and reports N/T with urination. She states that all of her pain is going to her lower leg.    Pertinent History  Gout, HTN, Enlarged heart    Limitations  Sitting;Standing;Walking;House hold activities    How long can you stand comfortably?  < 5 mins    How long can you walk comfortably?  < 5 mins    Diagnostic tests  No Blood  clots or kidneys stones    Patient Stated Goals  Decrease pain    Currently in Pain?  Yes    Pain Score  2     Pain Location  Leg    Pain Descriptors / Indicators  Aching;Tender    Pain Type  Chronic pain    Pain Onset  More than a month ago    Pain Frequency  Intermittent    Aggravating Factors   Standing, Walking, [place] to use bathroom    Pain Relieving Factors  urinating         [facility] PT Assessment - 04/21/20 0001      Assessment   Medical Diagnosis  Right calf pain    Referring Provider (PT)  Dene Gentry, MD     Onset Date/Surgical Date  11/19/19    Hand Dominance  Right    Next MD Visit  05/06/2020    Prior Therapy  no      Precautions   Precautions  None      Restrictions   Weight Bearing Restrictions  No      Balance Screen   Has the patient fallen in the past 6 months  No      Home Film/video editor residence    Living Arrangements  Parent    Type of [place]  One level      Prior Function   Level of Independence  Independent    Vocation  On disability    Leisure  Walking       Cognition   Overall Cognitive Status  Within Functional Limits for tasks assessed    Attention  Focused    Focused Attention  Appears intact    Memory  Appears intact    Awareness  Appears intact    Problem Solving  Appears intact      Observation/Other Assessments   Focus on Therapeutic Outcomes (FOTO)   99% limited       Observation/Other Assessments-Edema    Edema  Circumferential      Circumferential Edema   Circumferential - Right  Increased Rt. mid-foot edema       Posture/Postural Control   Posture/Postural Control  Postural limitations    Postural Limitations  Rounded Shoulders;Forward head      ROM / Strength   AROM / PROM / Strength  AROM      AROM   AROM Assessment Site  Ankle    Right/Left Ankle  Right    Right Ankle Dorsiflexion  7      Palpation   Palpation comment  Decreased Dorsalis Pedis Pulse on  the Rt.       Transfers   Comments  Difficulty getting out of chair and standing up       Ambulation/Gait   Gait Comments  Bilateral decreased ankle DF, knee flexion, and forefoot ER                Objective measurements completed on examination: See above findings.      [facility] Adult PT Treatment/Exercise - 04/21/20 0001      Exercises   Exercises  Ankle      Ankle Exercises: Standing   Heel Raises  Both;5 reps    Other Standing Ankle Exercises  Standing Gastroc/Soleus Stretch, Rt. side, 30 sec hold             PT Education - 04/21/20 1326    Education Details  Patient educated on vascular compression, lymphedema, new HEP, and pertinent anatomy    Person(s) Educated  Patient    Methods  Explanation;Handout    Comprehension  Verbalized understanding       PT Short Term Goals - 04/21/20 1413      PT SHORT TERM GOAL #1   Title  Patient will be independent with initial HEP    Baseline  HEP given 04/21/2020    Time  2    Period  Weeks    Status  New    Target Date  05/08/20      PT SHORT TERM GOAL #2   Title  Patient will be able to increase Rt. ankle DF >/= 3 degrees in order to demonstrate decreased calf tightness    Baseline  Rt. ankle DF: 7 degrees    Time  2    Period  Weeks    Status  New    Target Date  05/08/20      PT SHORT TERM GOAL #3   Title  Patient will report that she is able to stand for >/= 5 mins in order to return to functional activities per patient's stated  goals    Baseline  Patient reports that she can stand for no longer than 5 mins    Time  2    Period  Weeks    Status  New    Target Date  05/08/20        PT Long Term Goals - 04/21/20 1417      PT LONG TERM GOAL #1   Title  Patient will be able to achieve >/= 15 degrees of ankel DF in order to show increased ROM and reduction in calf tightness    Baseline  Rt. ankle DF: 7 degrees    Time  4    Period  Weeks    Status  New    Target Date  05/22/20      PT LONG TERM  GOAL #2   Title  Patient will report that she is able to walk for >/= 10 mins with </= 5/10 pain in order to care for her mother    Baseline  Patient reports that she can stand for < 5 mins    Time  4    Period  Weeks    Status  New    Target Date  05/22/20      PT LONG TERM GOAL #3   Title  Patient will be independent with all HEPs provided    Time  4    Period  Weeks    Status  New    Target Date  05/22/20             Plan - 04/21/20 1234    Clinical Impression Statement  Patient presents to the clinic with rt. leg pain. She reports that her pain started about 6 months ago with no apparent MOI. She has increased swelling on the right around her midfoot area. Both legs were about 47 cm around the mid-calf region. She had increased difficulty with standing from chair; It took her 30 secs to stand completely erect. Her Dorsalis pedis pulse was decreased on the Rt. side. She reports heaviness in rt. leg and states that pain makes standing and walking unbearable. Additionally, she reports that she does not take her blood pressure medicine and does not have a PCP. We suspect possible vascular compression or lymphedema. She reports that she has trouble climbing stairs, standing, and caring for her mother.  We suggest that the patient see a vascular specialist and nutritionist. Patient would benefit from PT to address dysfunctions, but we do not believe that MSK system is the primary source of her pain.    Personal Factors and Comorbidities  Comorbidity 3+    Comorbidities  Gout, HTN, Hx of alcohol abuse    Examination-Activity Limitations  Bend;Caring for Others;Sit;Stairs;Stand;Transfers    Stability/Clinical Decision Making  Unstable/Unpredictable    Clinical Decision Making  High    Rehab Potential  Fair    PT Frequency  2x / week    PT Duration  4 weeks    PT Treatment/Interventions  ADLs/Self Care Home Management;Cryotherapy;Electrical Stimulation;Iontophoresis 56m/ml  Dexamethasone;Moist Heat;Traction;Ultrasound;Parrafin;Fluidtherapy;Contrast Bath;Gait training;Stair training;Functional mobility training;Therapeutic activities;Therapeutic exercise;Balance training;Neuromuscular re-education;Cognitive remediation;Patient/family education;Orthotic Fit/Training;Manual techniques;Manual lymph drainage;Compression bandaging;Passive range of motion;Dry needling;Energy conservation;Taping;Vasopneumatic Device;Joint Manipulations;Splinting    PT Next Visit Plan  Walk on tread [place]   Recommended Other Services  Vascular Specialist, Nutritionist    Consulted and Agree with Plan of Care  Patient       Patient will benefit from skilled  therapeutic intervention in order to improve the following deficits and impairments:  Decreased activity tolerance, Decreased balance, Decreased coordination, Decreased endurance, Decreased mobility, Decreased range of motion, Decreased strength, Difficulty walking, Increased edema, Increased muscle spasms, Impaired flexibility, Impaired perceived functional ability, Obesity, Pain, Postural dysfunction, Improper body mechanics, Abnormal gait  Visit Diagnosis: Right calf pain  Decreased range of motion of right ankle     Problem List Patient Active Problem List   Diagnosis Date Noted  . Essential hypertension 08/06/2015  . Hypertensive heart disease 08/06/2015  . Morbid obesity ([place]) 08/06/2015  . Cardiac murmur 08/06/2015    Laveda Norman, SPT 04/21/2020, 2:57 PM  [facility] [address] [place], [place], [address] Phone: [phone]   Fax:  [phone]  Name: Vickie Patel MRN: [id number] Date of Birth: 1970/12/25   PHYSICAL THERAPY DISCHARGE SUMMARY  Visits from Start of Care: 1  Current functional level related to goals / functional outcomes: See above   Remaining deficits: See above   Education / Equipment: Anatomy of  condition, POC, HEP, exercise form/rationale  Plan: Patient agrees to discharge.  Patient goals were not met. Patient is being discharged due to the patient's request.  ?????    She states she would like to try other treatments for pain before doing the exercises. Feels her problem is more internal. Jessica C. Hightower PT, DPT 05/11/20 3:43 PM

## 2020-05-06 ENCOUNTER — Ambulatory Visit (INDEPENDENT_AMBULATORY_CARE_PROVIDER_SITE_OTHER): Payer: Self-pay | Admitting: Family Medicine

## 2020-05-06 ENCOUNTER — Other Ambulatory Visit: Payer: Self-pay

## 2020-05-06 ENCOUNTER — Encounter: Payer: Self-pay | Admitting: Family Medicine

## 2020-05-06 VITALS — BP 170/109 | Ht 65.5 in | Wt 300.0 lb

## 2020-05-06 DIAGNOSIS — M79661 Pain in right lower leg: Secondary | ICD-10-CM

## 2020-05-06 MED ORDER — BACLOFEN 10 MG PO TABS: 10.0000 mg | ORAL_TABLET | Freq: Three times a day (TID) | ORAL | 1 refills | Status: DC | PRN

## 2020-05-06 NOTE — Patient Instructions (Signed)
Call me when the [facility] Coverage goes through and we will refer you to the vascular doctor. You have a chronic calf strain Compression sleeve or ace wrap to help with swelling and pain if tolerated. Heat for 15 minutes at a time 3-4 times a day Heel lifts (or shoes with a heel lift) are important as well as the home exercises - make sure you do these every day. Tylenol 500mg  1-2 tabs three times a day. Topical voltaren gel, biofreeze, or capsaicin up to 4 times a day. Baclofen as needed for muscle spasms/pain up to 3 times a day. Follow up with me in 1 month otherwise.

## 2020-05-06 NOTE — Progress Notes (Signed)
PCP: Patient, No Pcp Per  Subjective:   HPI: Patient is a 49 y.o. female here for left calf pain.  4/21: Patient reports she's had about 6 months of pain throughout her left calf. She went to urgent care for this pain recently and was notified about our office to consider follow-up. She states pain is throughout right calf posteriorly. Difficult walking especially after standing - has to wait a couple minutes before pain eases some for her to be able to walk. Has tried OTC tylenol, NSAIDs without much relief. No shortness of breath.  No pain in right calf. Has not tried any other treatments for this.  5/19: Patient returns feeling about the same compared to last visit. She did one visit of PT - they were concerned about possible vascular cause. Patient has [facility] Coverage paperwork and is in the process of filling this out. Pain medial calf still, worse with walking. Feels better after she uses the restroom. Taking tylenol which helps. No new injuries.  Past Medical History:  Diagnosis Date  . Anemia   . Blood transfusion without reported diagnosis   . Constipation, chronic   . Enlarged heart   . Hypertension   . Uterine fibroid     Current Outpatient Medications on File Prior to Visit  Medication Sig Dispense Refill  . aspirin EC 81 MG tablet Take 81 mg by mouth daily.    . [DISCONTINUED] colchicine 0.6 MG tablet Take 1 tablet (0.6 mg total) by mouth 2 (two) times daily. 30 tablet 1   No current facility-administered medications on file prior to visit.    Past Surgical History:  Procedure Laterality Date  . TUBAL LIGATION      Allergies  Allergen Reactions  . Penicillins Hives    Childhood reaction unknown    Social History   Socioeconomic History  . Marital status: Legally Separated    Spouse name: Not on file  . Number of children: Not on file  . Years of education: Not on file  . Highest education level: Not on file  Occupational History  . Not on file   Tobacco Use  . Smoking status: Current Some Day Smoker    Packs/day: 0.25    Types: Cigarettes    Start date: 10/07/2015  . Smokeless tobacco: Current User  . Tobacco comment: 0.5 packs per week  Substance and Sexual Activity  . Alcohol use: Yes    Alcohol/week: 0.0 standard drinks    Comment: 1 pint per week  . Drug use: No  . Sexual activity: Yes    Birth control/protection: Surgical  Other Topics Concern  . Not on file  Social History Narrative  . Not on file   Social Determinants of Health   Financial Resource Strain:   . Difficulty of Paying Living Expenses:   Food Insecurity:   . Worried About Charity fundraiser in the Last Year:   . Arboriculturist in the Last Year:   Transportation Needs:   . Film/video editor (Medical):   Marland Kitchen Lack of Transportation (Non-Medical):   Physical Activity:   . Days of Exercise per Week:   . Minutes of Exercise per Session:   Stress:   . Feeling of Stress :   Social Connections:   . Frequency of Communication with Friends and Family:   . Frequency of Social Gatherings with Friends and Family:   . Attends Religious Services:   . Active Member of Clubs or Organizations:   .  Attends Archivist Meetings:   Marland Kitchen Marital Status:   Intimate Partner Violence:   . Fear of Current or Ex-Partner:   . Emotionally Abused:   Marland Kitchen Physically Abused:   . Sexually Abused:     Family History  Problem Relation Age of Onset  . Hypertension Mother   . Heart failure Mother   . Alzheimer's disease Mother     BP (!) 170/109   Ht 5' 5.5" (1.664 m)   Wt 300 lb (136.1 kg)   BMI 49.16 kg/m   Review of Systems: See HPI above.     Objective:  Physical Exam:  Gen: NAD, comfortable in exam room  Left lower leg: No deformity, pitting edema, swelling. FROM with 5/5 strength ankle motions though cannot due calf raise. TTP medial gastroc without palpable defect or cords. NVI distally including 2+ dp and pt pulses.    Assessment &  Plan:  1. Left chronic calf strain - encouraged her to do home exercises she learned in physical therapy.  Reviewed these again today.  Heel lifts, compression sleeve, heat.  Tylenol, topical medications, baclofen as needed.  She will fill out [facility] coverage paperwork, hopefully qualify.  Will refer to vascular to get their input.

## 2020-05-07 ENCOUNTER — Ambulatory Visit: Payer: Self-pay | Admitting: Physical Therapy

## 2020-05-07 ENCOUNTER — Telehealth: Payer: Self-pay | Admitting: Physical Therapy

## 2020-05-07 NOTE — Telephone Encounter (Signed)
Spoke to patient regarding no show to appointment. She reports she forgot about her appointment . She does plan to attend her future appointments.

## 2020-05-11 ENCOUNTER — Telehealth: Payer: Self-pay | Admitting: Physical Therapy

## 2020-05-11 ENCOUNTER — Ambulatory Visit: Payer: Self-pay | Admitting: Physical Therapy

## 2020-05-11 NOTE — Telephone Encounter (Signed)
Spoke with patient, she reports she was going to call. She feels that her problem is internal and would like to seek other treatments before exercises to help her pain.   Ajani Schnieders C. Rick Carruthers PT, DPT 05/11/20 3:42 PM

## 2020-05-15 ENCOUNTER — Ambulatory Visit: Payer: Self-pay | Admitting: Physical Therapy

## 2020-05-19 ENCOUNTER — Ambulatory Visit: Payer: Self-pay | Admitting: Physical Therapy

## 2020-05-22 ENCOUNTER — Encounter: Payer: Self-pay | Admitting: Physical Therapy

## 2020-06-10 ENCOUNTER — Other Ambulatory Visit: Payer: Self-pay

## 2020-06-10 DIAGNOSIS — Z1231 Encounter for screening mammogram for malignant neoplasm of breast: Secondary | ICD-10-CM

## 2020-06-15 ENCOUNTER — Ambulatory Visit (INDEPENDENT_AMBULATORY_CARE_PROVIDER_SITE_OTHER): Payer: Self-pay | Admitting: Family Medicine

## 2020-06-15 ENCOUNTER — Encounter: Payer: Self-pay | Admitting: Family Medicine

## 2020-06-15 ENCOUNTER — Other Ambulatory Visit: Payer: Self-pay

## 2020-06-15 VITALS — BP 224/105 | Ht 65.5 in | Wt 300.0 lb

## 2020-06-15 DIAGNOSIS — M79661 Pain in right lower leg: Secondary | ICD-10-CM

## 2020-06-15 NOTE — Progress Notes (Signed)
PCP: Patient, No Pcp Per  Subjective:   HPI: Patient is a 49 y.o. female here for left calf pain.  4/21: Patient reports she's had about 6 months of pain throughout her left calf. She went to urgent care for this pain recently and was notified about our office to consider follow-up. She states pain is throughout right calf posteriorly. Difficult walking especially after standing - has to wait a couple minutes before pain eases some for her to be able to walk. Has tried OTC tylenol, NSAIDs without much relief. No shortness of breath.  No pain in right calf. Has not tried any other treatments for this.  5/19: Patient returns feeling about the same compared to last visit. She did one visit of PT - they were concerned about possible vascular cause. Patient has [facility] Coverage paperwork and is in the process of filling this out. Pain medial calf still, worse with walking. Feels better after she uses the restroom. Taking tylenol which helps. No new injuries.  6/28: Patient reports feeling about the same again compared to last visit. She has not completed [facility] Coverage paperwork - reports she's trying to get disability though missed an appointment with them thinking it was in July. Continued pain medial proximal calf, worse with walking and getting up. Tried aspirin, 'all day arthritis' medicine, topicals, baclofen without much benefit. She admits to not doing home exercises or using compression sleeve.  Wearing sandals today. No new injuries.  Past Medical History:  Diagnosis Date  . Anemia   . Blood transfusion without reported diagnosis   . Constipation, chronic   . Enlarged heart   . Hypertension   . Uterine fibroid     Current Outpatient Medications on File Prior to Visit  Medication Sig Dispense Refill  . aspirin EC 81 MG tablet Take 81 mg by mouth daily.    . baclofen (LIORESAL) 10 MG tablet Take 1 tablet (10 mg total) by mouth 3 (three) times daily as needed for muscle  spasms. 60 tablet 1  . [DISCONTINUED] colchicine 0.6 MG tablet Take 1 tablet (0.6 mg total) by mouth 2 (two) times daily. 30 tablet 1   No current facility-administered medications on file prior to visit.    Past Surgical History:  Procedure Laterality Date  . TUBAL LIGATION      Allergies  Allergen Reactions  . Penicillins Hives    Childhood reaction unknown    Social History   Socioeconomic History  . Marital status: Legally Separated    Spouse name: Not on file  . Number of children: Not on file  . Years of education: Not on file  . Highest education level: Not on file  Occupational History  . Not on file  Tobacco Use  . Smoking status: Current Some Day Smoker    Packs/day: 0.25    Types: Cigarettes    Start date: 10/07/2015  . Smokeless tobacco: Current User  . Tobacco comment: 0.5 packs per week  Vaping Use  . Vaping Use: Never used  Substance and Sexual Activity  . Alcohol use: Yes    Alcohol/week: 0.0 standard drinks    Comment: 1 pint per week  . Drug use: No  . Sexual activity: Yes    Birth control/protection: Surgical  Other Topics Concern  . Not on file  Social History Narrative  . Not on file   Social Determinants of Health   Financial Resource Strain:   . Difficulty of Paying Living Expenses:   Food Insecurity:   .  Worried About Charity fundraiser in the Last Year:   . Arboriculturist in the Last Year:   Transportation Needs:   . Film/video editor (Medical):   Marland Kitchen Lack of Transportation (Non-Medical):   Physical Activity:   . Days of Exercise per Week:   . Minutes of Exercise per Session:   Stress:   . Feeling of Stress :   Social Connections:   . Frequency of Communication with Friends and Family:   . Frequency of Social Gatherings with Friends and Family:   . Attends Religious Services:   . Active Member of Clubs or Organizations:   . Attends Archivist Meetings:   Marland Kitchen Marital Status:   Intimate Partner Violence:   .  Fear of Current or Ex-Partner:   . Emotionally Abused:   Marland Kitchen Physically Abused:   . Sexually Abused:     Family History  Problem Relation Age of Onset  . Hypertension Mother   . Heart failure Mother   . Alzheimer's disease Mother     BP (!) 224/105   Ht 5' 5.5" (1.664 m)   Wt 300 lb (136.1 kg)   BMI 49.16 kg/m   Review of Systems: See HPI above.     Objective:  Physical Exam:  Gen: NAD, comfortable in exam room  Left lower leg: No deformity, pitting edema, palpable cords. FROM with 5/5 strength plantar and dorsiflexion. Tender to palpation medial gastroc.  NVI distally.  Assessment & Plan:  1. Left calf pain - continues to struggle with this.  Has not obtained [facility] coverage yet to refer to vascular for assessment.  Encouraged regularly doing the home exercises, heat, compression, heel lifts.  Tylenol, topical medications, baclofen.

## 2020-06-15 NOTE — Patient Instructions (Signed)
Call me when the [facility] Coverage (or other insurance coverage) goes through and we will refer you to the vascular doctor.  Compression sleeve or ace wrap to help with swelling and pain if tolerated. Heat for 15 minutes at a time 3-4 times a day Heel lifts (or shoes with a heel lift) are important as well as the home exercises - make sure you do these every day. Tylenol 500mg  1-2 tabs three times a day. Topical voltaren gel, biofreeze, or capsaicin up to 4 times a day. Baclofen as needed for muscle spasms/pain up to 3 times a day.

## 2020-06-21 ENCOUNTER — Other Ambulatory Visit: Payer: Self-pay | Admitting: Family Medicine

## 2020-07-07 ENCOUNTER — Ambulatory Visit: Payer: Self-pay

## 2020-07-22 ENCOUNTER — Encounter (INDEPENDENT_AMBULATORY_CARE_PROVIDER_SITE_OTHER): Payer: Self-pay | Admitting: Primary Care

## 2020-07-22 ENCOUNTER — Ambulatory Visit (INDEPENDENT_AMBULATORY_CARE_PROVIDER_SITE_OTHER): Payer: Self-pay | Admitting: Primary Care

## 2020-07-22 ENCOUNTER — Other Ambulatory Visit: Payer: Self-pay

## 2020-07-22 VITALS — BP 163/106 | HR 49 | Temp 97.9°F | Ht 65.0 in | Wt 328.8 lb

## 2020-07-22 DIAGNOSIS — Z7689 Persons encountering health services in other specified circumstances: Secondary | ICD-10-CM

## 2020-07-22 DIAGNOSIS — Z1211 Encounter for screening for malignant neoplasm of colon: Secondary | ICD-10-CM

## 2020-07-22 DIAGNOSIS — Z0000 Encounter for general adult medical examination without abnormal findings: Secondary | ICD-10-CM

## 2020-07-22 DIAGNOSIS — I10 Essential (primary) hypertension: Secondary | ICD-10-CM

## 2020-07-22 DIAGNOSIS — M25561 Pain in right knee: Secondary | ICD-10-CM

## 2020-07-22 DIAGNOSIS — G8929 Other chronic pain: Secondary | ICD-10-CM

## 2020-07-22 DIAGNOSIS — Z131 Encounter for screening for diabetes mellitus: Secondary | ICD-10-CM

## 2020-07-22 DIAGNOSIS — N951 Menopausal and female climacteric states: Secondary | ICD-10-CM

## 2020-07-22 DIAGNOSIS — M25562 Pain in left knee: Secondary | ICD-10-CM

## 2020-07-22 LAB — POCT GLYCOSYLATED HEMOGLOBIN (HGB A1C): Hemoglobin A1C: 5.1 % (ref 4.0–5.6)

## 2020-07-22 MED ORDER — AMLODIPINE BESYLATE 10 MG PO TABS: 10.0000 mg | ORAL_TABLET | Freq: Every day | ORAL | 3 refills | Status: DC

## 2020-07-22 MED ORDER — LOSARTAN POTASSIUM-HCTZ 100-25 MG PO TABS: 1.0000 | ORAL_TABLET | Freq: Every day | ORAL | 3 refills | Status: DC

## 2020-07-22 MED ORDER — NAPROXEN 500 MG PO TABS: 500.0000 mg | ORAL_TABLET | Freq: Two times a day (BID) | ORAL | 0 refills | Status: DC

## 2020-07-22 NOTE — Progress Notes (Signed)
Bilateral leg pain Pain is so bad she can not get up

## 2020-07-22 NOTE — Progress Notes (Signed)
New Patient Office Visit  Subjective:  Patient ID: Vickie Patel, female    DOB: 08-21-1971  Age: 49 y.o. MRN: [id number]  CC:  Chief Complaint  Patient presents with  . New Patient (Initial Visit)    leg pain    HPI [name] is a 49 year old morbid female presents for establish care. Uncontrolled hypertension 163/106  She has been having shortness of breath, headaches, floater in eyes  and  chest pain. She has bilateral knee pain . She acknowledge her weight and has gain 15 lbs eating what she wants.   Past Medical History:  Diagnosis Date  . Anemia   . Blood transfusion without reported diagnosis   . Constipation, chronic   . Enlarged heart   . Hypertension   . Uterine fibroid     Past Surgical History:  Procedure Laterality Date  . TUBAL LIGATION      Family History  Problem Relation Age of Onset  . Hypertension Mother   . Heart failure Mother   . Alzheimer's disease Mother     Social History   Socioeconomic History  . Marital status: Legally Separated    Spouse name: Not on file  . Number of children: Not on file  . Years of education: Not on file  . Highest education level: Not on file  Occupational History  . Not on file  Tobacco Use  . Smoking status: Former Smoker    Packs/day: 0.25    Types: Cigarettes    Start date: 10/07/2015  . Smokeless tobacco: Current User  . Tobacco comment: 0.5 packs per week  Vaping Use  . Vaping Use: Never used  Substance and Sexual Activity  . Alcohol use: Yes    Alcohol/week: 0.0 standard drinks    Comment: 1 pint per week  . Drug use: No  . Sexual activity: Yes    Birth control/protection: Surgical  Other Topics Concern  . Not on file  Social History Narrative  . Not on file   Social Determinants of Health   Financial Resource Strain:   . Difficulty of Paying Living Expenses:   Food Insecurity:   . Worried About Charity fundraiser in the Last Year:   . Arboriculturist in the Last Year:    Transportation Needs:   . Film/video editor (Medical):   Marland Kitchen Lack of Transportation (Non-Medical):   Physical Activity:   . Days of Exercise per Week:   . Minutes of Exercise per Session:   Stress:   . Feeling of Stress :   Social Connections:   . Frequency of Communication with Friends and Family:   . Frequency of Social Gatherings with Friends and Family:   . Attends Religious Services:   . Active Member of Clubs or Organizations:   . Attends Archivist Meetings:   Marland Kitchen Marital Status:   Intimate Partner Violence:   . Fear of Current or Ex-Partner:   . Emotionally Abused:   Marland Kitchen Physically Abused:   . Sexually Abused:     ROS Review of Systems  Eyes: Positive for visual disturbance.  Respiratory: Positive for shortness of breath.        Body mass index is 54.72 kg/m.  Cardiovascular: Positive for chest pain.       Bp 163/106 yesterday   Endocrine: Positive for polydipsia, polyphagia and polyuria.  Musculoskeletal:       Bilateral hip and knee pain   Neurological: Positive for  weakness and headaches.       Knees  Psychiatric/Behavioral:       Followed by psychiatry   All other systems reviewed and are negative.   Objective:   Today's Vitals: BP (!) 163/106 (BP Location: Right Wrist, Patient Position: Sitting, Cuff Size: Normal)   Pulse (!) 49   Temp 97.9 F (36.6 C) (Oral)   Ht 5' 5"  (1.651 m)   Wt (!) 328 lb 12.8 oz (149.1 kg)   SpO2 93%   BMI 54.72 kg/m   Physical Exam Vitals reviewed.  Constitutional:      Comments: Morbid obesity  Body mass index is 54.72 kg/m.  HENT:     Head: Normocephalic.     Right Ear: Tympanic membrane normal.     Left Ear: Tympanic membrane normal.     Nose: Nose normal.  Eyes:     Extraocular Movements: Extraocular movements intact.     Pupils: Pupils are equal, round, and reactive to light.  Cardiovascular:     Rate and Rhythm: Normal rate and regular rhythm.     Pulses: Normal pulses.     Heart sounds:  Normal heart sounds.  Pulmonary:     Effort: Pulmonary effort is normal.     Breath sounds: Normal breath sounds.  Abdominal:     General: Bowel sounds are normal. There is distension.  Musculoskeletal:        General: Tenderness present. Normal range of motion.     Cervical back: Normal range of motion and neck supple.  Skin:    General: Skin is warm and dry.  Neurological:     Mental Status: She is oriented to person, place, and time.  Psychiatric:        Mood and Affect: Mood normal.        Behavior: Behavior normal.        Thought Content: Thought content normal.        Judgment: Judgment normal.     Assessment & Plan:  Vickie Patel was seen today for new patient (initial visit).  Diagnoses and all orders for this visit:  Encounter to establish care  Colon cancer screening -     Fecal occult blood, imunochemical; Future  Perimenopausal -     CBC with Differential; Future  Morbid obesity (HCC) Body mass index is 54.72 kg/m.  Increased risk of cardiovascular disease, diabetes, respiratory complications.  Can be indicating an excess in caloric intake or underlining conditions. This may lead to other co-morbidities. Lifestyle modifications of diet and exercise may reduce obesity.  -     Lipid panel; Future -     TSH + free T4; Future  Health care maintenance -     Hepatitis C Antibody; Future  Essential hypertension Counseled on blood pressure goal of less than 130/80, low-sodium, DASH diet, medication compliance, 150 minutes of moderate intensity exercise per week. Discussed medication compliance, adverse effects. -     CMP14+EGFR; Future -     amLODipine (NORVASC) 10 MG tablet; Take 1 tablet (10 mg total) by mouth daily. -     losartan-hydrochlorothiazide (HYZAAR) 100-25 MG tablet; Take 1 tablet by mouth daily.  Screening for diabetes mellitus -     TSH + free T4; Future -     HgB A1c  Chronic pain of both knees -     naproxen (NAPROSYN) 500 MG tablet; Take 1 tablet  (500 mg total) by mouth 2 (two) times daily with a meal.    Outpatient Encounter  Medications as of 07/22/2020  Medication Sig  . aspirin EC 81 MG tablet Take 81 mg by mouth daily.  . baclofen (LIORESAL) 10 MG tablet Take 1 tablet (10 mg total) by mouth 3 (three) times daily as needed for muscle spasms.  Marland Kitchen amLODipine (NORVASC) 10 MG tablet Take 1 tablet (10 mg total) by mouth daily.  Marland Kitchen losartan-hydrochlorothiazide (HYZAAR) 100-25 MG tablet Take 1 tablet by mouth daily.  . naproxen (NAPROSYN) 500 MG tablet Take 1 tablet (500 mg total) by mouth 2 (two) times daily with a meal.  . [DISCONTINUED] colchicine 0.6 MG tablet Take 1 tablet (0.6 mg total) by mouth 2 (two) times daily.   No facility-administered encounter medications on file as of 07/22/2020.    Follow-up: Return in about 6 weeks (around 09/02/2020) for in person Bp ck.   Kerin Perna, NP

## 2020-07-22 NOTE — Patient Instructions (Signed)

## 2020-08-23 ENCOUNTER — Other Ambulatory Visit (INDEPENDENT_AMBULATORY_CARE_PROVIDER_SITE_OTHER): Payer: Self-pay | Admitting: Primary Care

## 2020-08-23 DIAGNOSIS — M25561 Pain in right knee: Secondary | ICD-10-CM

## 2020-08-23 NOTE — Telephone Encounter (Signed)
Requested medication (s) are due for refill today: Yes  Requested medication (s) are on the active medication list: Yes  Last refill:  07/22/20  Future visit scheduled: No  Notes to clinic: Diagnosis code needed     Requested Prescriptions  Pending Prescriptions Disp Refills   naproxen (NAPROSYN) 500 MG tablet [Pharmacy Med Name: NAPROXEN 500 MG TABLET] 30 tablet 0    Sig: Take 1 tablet (500 mg total) by mouth 2 (two) times daily with a meal.      Analgesics:  NSAIDS Failed - 08/23/2020  8:35 PM      Failed - Cr in normal range and within 360 days    Creatinine, Ser  Date Value Ref Range Status  07/09/2014 1.05 0.50 - 1.10 mg/dL Final          Failed - HGB in normal range and within 360 days    Hemoglobin  Date Value Ref Range Status  07/09/2014 6.3 (LL) 12.0 - 15.0 g/dL Final    Comment:    CRITICAL RESULT CALLED TO, READ BACK BY AND VERIFIED WITH: HAMILTON,J. RN @ [phone] ON 07/09/14 BY MCCOY,N.           Passed - Patient is not pregnant      Passed - Valid encounter within last 12 months    Recent Outpatient Visits           1 month ago Encounter to establish care   [place] Kerin Perna, NP

## 2020-09-10 ENCOUNTER — Encounter (HOSPITAL_COMMUNITY): Payer: Self-pay

## 2020-09-10 ENCOUNTER — Emergency Department (HOSPITAL_COMMUNITY)
Admission: EM | Admit: 2020-09-10 | Discharge: 2020-09-10 | Disposition: A | Discharge status: Home / Self Care | Payer: Self-pay | Attending: Emergency Medicine | Admitting: Emergency Medicine

## 2020-09-10 ENCOUNTER — Other Ambulatory Visit: Payer: Self-pay

## 2020-09-10 DIAGNOSIS — R509 Fever, unspecified: Secondary | ICD-10-CM | POA: Insufficient documentation

## 2020-09-10 DIAGNOSIS — R5383 Other fatigue: Secondary | ICD-10-CM | POA: Insufficient documentation

## 2020-09-10 DIAGNOSIS — Z87891 Personal history of nicotine dependence: Secondary | ICD-10-CM | POA: Insufficient documentation

## 2020-09-10 DIAGNOSIS — Z79899 Other long term (current) drug therapy: Secondary | ICD-10-CM | POA: Insufficient documentation

## 2020-09-10 DIAGNOSIS — Z7982 Long term (current) use of aspirin: Secondary | ICD-10-CM | POA: Insufficient documentation

## 2020-09-10 DIAGNOSIS — I10 Essential (primary) hypertension: Secondary | ICD-10-CM | POA: Insufficient documentation

## 2020-09-10 DIAGNOSIS — R05 Cough: Secondary | ICD-10-CM | POA: Insufficient documentation

## 2020-09-10 DIAGNOSIS — Z20822 Contact with and (suspected) exposure to covid-19: Secondary | ICD-10-CM

## 2020-09-10 NOTE — ED Notes (Signed)
Pt was walking out and asked pt if they were leaving. Pt stated that the EPD told her she needs to follow up with the clinic. We explained to the pt that pt provider has not discharged her and would provide paperwork with instructions for the clinic, as well they would like to test her for the flu and covid. Pt acknowledged that she understood verbally, but stated "I would just like to go home and lay down." Pt left before signing AMA.

## 2020-09-10 NOTE — Discharge Instructions (Addendum)
Test Results for COVID-19 pending  You have a test pending for COVID-19.  Results typically return within about 48 hours.  Be sure to check MyChart for updated results.  We recommend isolating yourself until results are received.  Patients who have symptoms consistent with COVID-19 should self isolated for: At least 3 days (72 hours) have passed since recovery, defined as resolution of fever without the use of fever reducing medications and improvement in respiratory symptoms (e.g., cough, shortness of breath), and At least 7 days have passed since symptoms first appeared.  If you have no symptoms, but your test returns positive, recommend isolating for at least 10 days.

## 2020-09-10 NOTE — ED Triage Notes (Signed)
Pt arrived via walk in, c/o generalized body aches, loss of appetite, loss of smell and taste, fevers/chills at home. Some intermittent chest pain. States she was around her sister who tested positive for COVID approx x1 wk ago

## 2020-09-10 NOTE — ED Provider Notes (Signed)
[facility] DEPT Provider Note   CSN: [id number] Arrival date & time: 09/10/20  1610     History Chief Complaint  Patient presents with  . Generalized Body Aches    Vickie Patel is a 49 y.o. female.  HPI      Vickie Patel is a 49 y.o. female, with a history of HTN, morbid obesity, presenting to the ED with cough, subjective fever, body aches, fatigue, intermittent shortness of breath for the past week. Chest discomfort only when coughing. States she was exposed to Covid at a party. Denies persistent lower extremity edema/pain, abdominal pain, other chest pain, N/V/D, or any other complaints.      Past Medical History:  Diagnosis Date  . Anemia   . Blood transfusion without reported diagnosis   . Constipation, chronic   . Enlarged heart   . Hypertension   . Uterine fibroid     Patient Active Problem List   Diagnosis Date Noted  . Essential hypertension 08/06/2015  . Hypertensive heart disease 08/06/2015  . Morbid obesity ([place]) 08/06/2015  . Cardiac murmur 08/06/2015    Past Surgical History:  Procedure Laterality Date  . TUBAL LIGATION       OB History    Gravida  4   Para  4   Term      Preterm      AB      Living  4     SAB      TAB      Ectopic      Multiple      Live Births              Family History  Problem Relation Age of Onset  . Hypertension Mother   . Heart failure Mother   . Alzheimer's disease Mother     Social History   Tobacco Use  . Smoking status: Former Smoker    Packs/day: 0.25    Types: Cigarettes    Start date: 10/07/2015  . Smokeless tobacco: Current User  . Tobacco comment: 0.5 packs per week  Vaping Use  . Vaping Use: Never used  Substance Use Topics  . Alcohol use: Yes    Alcohol/week: 0.0 standard drinks    Comment: 1 pint per week  . Drug use: No    Home Medications Prior to Admission medications   Medication Sig Start Date End Date Taking?  Authorizing Provider  amLODipine (NORVASC) 10 MG tablet Take 1 tablet (10 mg total) by mouth daily. 07/22/20   Kerin Perna, NP  aspirin EC 81 MG tablet Take 81 mg by mouth daily.    [provider]  baclofen (LIORESAL) 10 MG tablet Take 1 tablet (10 mg total) by mouth 3 (three) times daily as needed for muscle spasms. 05/06/20   Hudnall, Sharyn Lull, MD  losartan-hydrochlorothiazide (HYZAAR) 100-25 MG tablet Take 1 tablet by mouth daily. 07/22/20   Kerin Perna, NP  naproxen (NAPROSYN) 500 MG tablet TAKE 1 TABLET (500 MG TOTAL) BY MOUTH 2 (TWO) TIMES DAILY WITH A MEAL. 08/26/20   Kerin Perna, NP  colchicine 0.6 MG tablet Take 1 tablet (0.6 mg total) by mouth 2 (two) times daily. 11/03/18 08/12/19  Orvan July, NP    Allergies    Penicillins  Review of Systems   Review of Systems  Constitutional: Positive for fatigue and fever.  Respiratory: Positive for cough and shortness of breath.   Cardiovascular: Negative for chest pain  and leg swelling.  Gastrointestinal: Negative for abdominal pain, diarrhea, nausea and vomiting.  Musculoskeletal: Positive for myalgias.  Neurological: Negative for syncope.  All other systems reviewed and are negative.   Physical Exam Updated Vital Signs BP 124/90 (BP Location: Left Arm)   Pulse 67   Temp 98.5 F (36.9 C) (Oral)   Resp 11   Ht 5\' 5"  (1.651 m)   Wt 136.1 kg   SpO2 96%   BMI 49.92 kg/m   Physical Exam Vitals and nursing note reviewed.  Constitutional:      General: She is not in acute distress.    Appearance: She is well-developed. She is obese. She is not diaphoretic.  HENT:     Head: Normocephalic and atraumatic.     Mouth/Throat:     Mouth: Mucous membranes are moist.     Pharynx: Oropharynx is clear.  Eyes:     Conjunctiva/sclera: Conjunctivae normal.  Cardiovascular:     Rate and Rhythm: Normal rate and regular rhythm.     Pulses: Normal pulses.          Radial pulses are 2+ on the right side and 2+ on  the left side.       Posterior tibial pulses are 2+ on the right side and 2+ on the left side.     Heart sounds: Normal heart sounds.     Comments: Tactile temperature in the extremities appropriate and equal bilaterally. Pulmonary:     Effort: Pulmonary effort is normal. No respiratory distress.     Breath sounds: Normal breath sounds.     Comments: No increased work of breathing. Speaks in full sentences without difficulty. Abdominal:     Palpations: Abdomen is soft.     Tenderness: There is no abdominal tenderness. There is no guarding.  Musculoskeletal:     Cervical back: Neck supple.     Right lower leg: No edema.     Left lower leg: No edema.  Lymphadenopathy:     Cervical: No cervical adenopathy.  Skin:    General: Skin is warm and dry.  Neurological:     Mental Status: She is alert.  Psychiatric:        Mood and Affect: Mood and affect normal.        Speech: Speech normal.        Behavior: Behavior normal.     ED Results / Procedures / Treatments   Labs (all labs ordered are listed, but only abnormal results are displayed) Labs Reviewed  RESPIRATORY PANEL BY RT PCR (FLU A&B, COVID)    EKG None  Radiology No results found.  Procedures Procedures (including critical care time)  Medications Ordered in ED Medications - No data to display  ED Course  I have reviewed the triage vital signs and the nursing notes.  Pertinent labs & imaging results that were available during my care of the patient were reviewed by me and considered in my medical decision making (see chart for details).    MDM Rules/Calculators/A&P                          Patient presents with cough, fatigue, body aches, subjective fever. Patient is nontoxic appearing, afebrile, not tachycardic, not tachypneic, not hypotensive, excellent SPO2 on room air, and is in no apparent distress.   Patient has had exposure to COVID-19. I discussed the possibility for her symptoms to be due to COVID-19.  We discussed the plan that  included testing for Covid.  Patient agreed to this plan. When the nurse tried to obtain sample for COVID-19 testing, patient refused and left the department.  She did not give an opportunity for me to speak with her again.   Final Clinical Impression(s) / ED Diagnoses Final diagnoses:  Person under investigation for COVID-19    Rx / DC Orders ED Discharge Orders    None       Layla Maw 09/10/20 1755    Lucrezia Starch, MD 09/12/20 1327

## 2020-12-28 ENCOUNTER — Emergency Department (HOSPITAL_COMMUNITY)
Admission: EM | Admit: 2020-12-28 | Discharge: 2020-12-28 | Discharge status: Against Medical Advice | Payer: No Typology Code available for payment source

## 2020-12-28 NOTE — ED Notes (Signed)
Pt called 3x for triage with no answer. Eloped prior to triage.

## 2021-02-20 ENCOUNTER — Ambulatory Visit (HOSPITAL_COMMUNITY)
Admission: EM | Admit: 2021-02-20 | Discharge: 2021-02-20 | Disposition: A | Discharge status: Home / Self Care | Payer: Self-pay | Attending: Medical Oncology | Admitting: Medical Oncology

## 2021-02-20 ENCOUNTER — Inpatient Hospital Stay (HOSPITAL_COMMUNITY)
Admission: AD | Admit: 2021-02-20 | Discharge: 2021-02-20 | Disposition: A | Discharge status: Home / Self Care | Payer: Self-pay | Attending: Obstetrics and Gynecology | Admitting: Obstetrics and Gynecology

## 2021-02-20 ENCOUNTER — Encounter (HOSPITAL_COMMUNITY): Payer: Self-pay | Admitting: Emergency Medicine

## 2021-02-20 ENCOUNTER — Emergency Department (HOSPITAL_COMMUNITY)
Admission: EM | Admit: 2021-02-20 | Discharge: 2021-02-21 | Discharge status: Against Medical Advice | Payer: Self-pay | Attending: Emergency Medicine | Admitting: Emergency Medicine

## 2021-02-20 ENCOUNTER — Other Ambulatory Visit: Payer: Self-pay

## 2021-02-20 DIAGNOSIS — Z7982 Long term (current) use of aspirin: Secondary | ICD-10-CM | POA: Insufficient documentation

## 2021-02-20 DIAGNOSIS — M549 Dorsalgia, unspecified: Secondary | ICD-10-CM

## 2021-02-20 DIAGNOSIS — I160 Hypertensive urgency: Secondary | ICD-10-CM | POA: Insufficient documentation

## 2021-02-20 DIAGNOSIS — Z79899 Other long term (current) drug therapy: Secondary | ICD-10-CM | POA: Insufficient documentation

## 2021-02-20 DIAGNOSIS — R109 Unspecified abdominal pain: Secondary | ICD-10-CM | POA: Insufficient documentation

## 2021-02-20 DIAGNOSIS — R82998 Other abnormal findings in urine: Secondary | ICD-10-CM

## 2021-02-20 DIAGNOSIS — M79604 Pain in right leg: Secondary | ICD-10-CM | POA: Insufficient documentation

## 2021-02-20 DIAGNOSIS — N898 Other specified noninflammatory disorders of vagina: Secondary | ICD-10-CM

## 2021-02-20 DIAGNOSIS — Z87891 Personal history of nicotine dependence: Secondary | ICD-10-CM | POA: Insufficient documentation

## 2021-02-20 DIAGNOSIS — I10 Essential (primary) hypertension: Secondary | ICD-10-CM | POA: Insufficient documentation

## 2021-02-20 DIAGNOSIS — Z3202 Encounter for pregnancy test, result negative: Secondary | ICD-10-CM | POA: Insufficient documentation

## 2021-02-20 DIAGNOSIS — M25551 Pain in right hip: Secondary | ICD-10-CM | POA: Insufficient documentation

## 2021-02-20 DIAGNOSIS — R300 Dysuria: Secondary | ICD-10-CM

## 2021-02-20 LAB — CBC
HCT: 42.9 % (ref 36.0–46.0)
Hemoglobin: 13 g/dL (ref 12.0–15.0)
MCH: 25.9 pg — ABNORMAL LOW (ref 26.0–34.0)
MCHC: 30.3 g/dL (ref 30.0–36.0)
MCV: 85.5 fL (ref 80.0–100.0)
Platelets: 133 10*3/uL — ABNORMAL LOW (ref 150–400)
RBC: 5.02 MIL/uL (ref 3.87–5.11)
RDW: 15.2 % (ref 11.5–15.5)
WBC: 5 10*3/uL (ref 4.0–10.5)
nRBC: 0 % (ref 0.0–0.2)

## 2021-02-20 LAB — POCT URINALYSIS DIPSTICK, ED / UC
Bilirubin Urine: NEGATIVE
Glucose, UA: NEGATIVE mg/dL
Ketones, ur: NEGATIVE mg/dL
Leukocytes,Ua: NEGATIVE
Nitrite: NEGATIVE
Protein, ur: 100 mg/dL — AB
Specific Gravity, Urine: 1.025 (ref 1.005–1.030)
Urobilinogen, UA: 0.2 mg/dL (ref 0.0–1.0)
pH: 5.5 (ref 5.0–8.0)

## 2021-02-20 LAB — I-STAT BETA HCG BLOOD, ED (MC, WL, AP ONLY): I-stat hCG, quantitative: <5 m[IU]/mL (ref <5)

## 2021-02-20 LAB — BASIC METABOLIC PANEL
Anion gap: 9 (ref 5–15)
BUN: 23 mg/dL — ABNORMAL HIGH (ref 6–20)
CO2: 25 mmol/L (ref 22–32)
Calcium: 8.9 mg/dL (ref 8.9–10.3)
Chloride: 108 mmol/L (ref 98–111)
Creatinine, Ser: 1.21 mg/dL — ABNORMAL HIGH (ref 0.44–1.00)
GFR, Estimated: 54 mL/min — ABNORMAL LOW (ref >60)
Glucose, Bld: 76 mg/dL (ref 70–99)
Potassium: 3.9 mmol/L (ref 3.5–5.1)
Sodium: 142 mmol/L (ref 135–145)

## 2021-02-20 LAB — TROPONIN I (HIGH SENSITIVITY): Troponin I (High Sensitivity): 23 ng/L — ABNORMAL HIGH (ref <18)

## 2021-02-20 LAB — POCT PREGNANCY, URINE: Preg Test, Ur: NEGATIVE

## 2021-02-20 NOTE — ED Triage Notes (Signed)
Pt states that she has back pain, dysuria, and vaginal discharge. Pt states that her sx started three weeks ago. Pt states that she is having right leg pain that started months ago. Pt denies any injury

## 2021-02-20 NOTE — ED Triage Notes (Signed)
Pt sent from UC for hypertensive crisis, pt has not had PCP to get HTN meds x 2 months. Pt c/o R hip/leg pain x 1 month. Pt reports intermittent blurred vision/dizziness.

## 2021-02-20 NOTE — ED Notes (Signed)
Patient states she isnt allowing [place] to take her blood pressure any more until she speaks with a RN and receives blood pressure medicine.

## 2021-02-20 NOTE — MAU Note (Signed)
Vickie Patel is a 50 y.o.  here in MAU reporting: abdominal pain that radiates to her back for the past month. States she is having some pressure when she urinates and is concerned about UTI.  LMP: 5 months ago  Onset of complaint: ongoing  Pain score: 0/10  Vitals:   02/20/21 1654  BP: (!) 155/101  Pulse: 70  Resp: 16  Temp: 98.1 F (36.7 C)  SpO2: 95%     Lab orders placed from triage: upt

## 2021-02-20 NOTE — MAU Provider Note (Signed)
S Ms. Vickie Patel is a 50 y.o. G69P4 female who presents to MAU today with complaint of abdominal pressure. She thinks she may have a UTI. She is also requesting BP medication. She does not think she is pregnant.  ROS: +abd pain  O BP (!) 155/101 (BP Location: Right Arm)   Pulse 70   Temp 98.1 F (36.7 C) (Oral)   Resp 16   SpO2 95% Comment: room air Physical Exam Vitals and nursing note reviewed.  Constitutional:      Appearance: Normal appearance.  HENT:     Head: Normocephalic.  Cardiovascular:     Rate and Rhythm: Normal rate.  Pulmonary:     Effort: Pulmonary effort is normal. No respiratory distress.  Musculoskeletal:     Cervical back: Normal range of motion.  Neurological:     General: No focal deficit present.     Mental Status: She is alert and oriented to person, place, and time.  Psychiatric:        Mood and Affect: Mood normal.        Behavior: Behavior normal.    Results for orders placed or performed during the hospital encounter of 02/20/21 (from the past 24 hour(s))  Pregnancy, urine POC     Status: None   Collection Time: 02/20/21  4:48 PM  Result Value Ref Range   Preg Test, Ur NEGATIVE NEGATIVE   MDM: No signs of pregnancy. Recommend evaluation in UC or ED. Pt prefers the UC. Stable for discharge home.   A 1. Pregnancy test negative    P Discharge from MAU in stable condition Patient may return to MAU as needed for pregnancy related complaints  Julianne Handler, CNM 02/20/2021 5:05 PM

## 2021-02-20 NOTE — ED Provider Notes (Signed)
[facility]    CSN: [id number] Arrival date & time: 02/20/21  1722      History   Chief Complaint Chief Complaint  Patient presents with  . Back Pain  . Dysuria  . Vaginal Discharge    HPI Vickie Patel is a 50 y.o. female.   HPI   Dark Urine: Pt states that she has had symptoms of dark urine, kidney pain for 3 weeks. In addition her BP is not well controlled. She denies dysuria, urinary frequency, abdominal pain. Also no chest pain, SOB, or peripheral edema. Mild vaginal discharge. She also notes some right leg pain which has been going on for months. She reports that she has been taking 3 naproxen 2 times per day for the past few months for this pain.   Past Medical History:  Diagnosis Date  . Anemia   . Blood transfusion without reported diagnosis   . Constipation, chronic   . Enlarged heart   . Hypertension   . Uterine fibroid     Patient Active Problem List   Diagnosis Date Noted  . Essential hypertension 08/06/2015  . Hypertensive heart disease 08/06/2015  . Morbid obesity ([place]) 08/06/2015  . Cardiac murmur 08/06/2015    Past Surgical History:  Procedure Laterality Date  . TUBAL LIGATION      OB History    Gravida  4   Para  4   Term      Preterm      AB      Living  4     SAB      IAB      Ectopic      Multiple      Live Births               Home Medications    Prior to Admission medications   Medication Sig Start Date End Date Taking? Authorizing Provider  amLODipine (NORVASC) 10 MG tablet Take 1 tablet (10 mg total) by mouth daily. 07/22/20   Kerin Perna, NP  aspirin EC 81 MG tablet Take 81 mg by mouth daily.    [provider]  baclofen (LIORESAL) 10 MG tablet Take 1 tablet (10 mg total) by mouth 3 (three) times daily as needed for muscle spasms. 05/06/20   Hudnall, Sharyn Lull, MD  losartan-hydrochlorothiazide (HYZAAR) 100-25 MG tablet Take 1 tablet by mouth daily. 07/22/20   Kerin Perna, NP   naproxen (NAPROSYN) 500 MG tablet TAKE 1 TABLET (500 MG TOTAL) BY MOUTH 2 (TWO) TIMES DAILY WITH A MEAL. 08/26/20   Kerin Perna, NP  colchicine 0.6 MG tablet Take 1 tablet (0.6 mg total) by mouth 2 (two) times daily. 11/03/18 08/12/19  Orvan July, NP    Family History Family History  Problem Relation Age of Onset  . Hypertension Mother   . Heart failure Mother   . Alzheimer's disease Mother     Social History Social History   Tobacco Use  . Smoking status: Former Smoker    Packs/day: 0.25    Types: Cigarettes    Start date: 10/07/2015  . Smokeless tobacco: Current User  . Tobacco comment: 0.5 packs per week  Vaping Use  . Vaping Use: Never used  Substance Use Topics  . Alcohol use: Yes    Alcohol/week: 0.0 standard drinks    Comment: 1 pint per week  . Drug use: No     Allergies   Penicillins   Review of Systems Review  of Systems  As stated above in HPI Physical Exam Triage Vital Signs ED Triage Vitals  Enc Vitals Group     BP 02/20/21 1805 (!) 188/120     Pulse Rate 02/20/21 1805 65     Resp 02/20/21 1805 19     Temp --      Temp Source 02/20/21 1805 Oral     SpO2 02/20/21 1805 100 %     Weight --      Height --      Head Circumference --      Peak Flow --      Pain Score 02/20/21 1804 9     Pain Loc --      Pain Edu? --      Excl. in [place]? --    No data found.  Updated Vital Signs BP (!) 188/120 (BP Location: Left Wrist)   Pulse 65   Resp 19   SpO2 100%   Physical Exam Vitals and nursing note reviewed.  Constitutional:      General: She is not in acute distress.    Appearance: Normal appearance. She is obese. She is not ill-appearing, toxic-appearing or diaphoretic.  HENT:     Head: Normocephalic.  Cardiovascular:     Rate and Rhythm: Normal rate and regular rhythm.     Heart sounds: Normal heart sounds.  Pulmonary:     Effort: Pulmonary effort is normal.     Breath sounds: Normal breath sounds.  Abdominal:     General: Bowel  sounds are normal. There is no distension.     Palpations: Abdomen is soft. There is no mass.     Tenderness: There is no abdominal tenderness. There is no right CVA tenderness, left CVA tenderness, guarding or rebound.     Hernia: No hernia is present.  Lymphadenopathy:     Cervical: No cervical adenopathy.  Skin:    General: Skin is warm.  Neurological:     Mental Status: She is alert.     Gait: Gait abnormal (limping ).      UC Treatments / Results  Labs (all labs ordered are listed, but only abnormal results are displayed) Labs Reviewed  URINE CULTURE  POCT URINALYSIS DIPSTICK, ED / UC    EKG   Radiology No results found.  Procedures Procedures (including critical care time)  Medications Ordered in UC Medications - No data to display  Initial Impression / Assessment and Plan / UC Course  I have reviewed the triage vital signs and the nursing notes.  Pertinent labs & imaging results that were available during my care of the patient were reviewed by me and considered in my medical decision making (see chart for details).     New. UA and history is concerning for kidney injury. I have discussed my concerns with patient. I discussed that I have recommended that she be evaluated in the emergency room further. She is hesitant but finally agreeable once we discuss more about why I am concerned. Her husband is able to transport her via private vehicle.    Final Clinical Impressions(s) / UC Diagnoses   Final diagnoses:  None   Discharge Instructions   None    ED Prescriptions    None     PDMP not reviewed this encounter.   Hughie Closs, [place] 02/20/21 1911

## 2021-02-21 ENCOUNTER — Emergency Department (HOSPITAL_COMMUNITY): Payer: Self-pay

## 2021-02-21 MED ORDER — AMLODIPINE BESYLATE 10 MG PO TABS: 10.0000 mg | ORAL_TABLET | Freq: Every day | ORAL | 0 refills | Status: DC

## 2021-02-21 MED ORDER — AMLODIPINE BESYLATE 5 MG PO TABS: 10.0000 mg | ORAL_TABLET | Freq: Once | ORAL | Status: DC

## 2021-02-21 MED ORDER — HYDRALAZINE HCL 20 MG/ML IJ SOLN: 10.0000 mg | INTRAMUSCULAR | Status: DC

## 2021-02-21 NOTE — ED Provider Notes (Signed)
[facility] EMERGENCY DEPARTMENT Provider Note   CSN: [id number] Arrival date & time: 02/20/21  1918     History Chief Complaint  Patient presents with  . Hypertension    Vickie Patel is a 50 y.o. female.  50yo F w/ PMH including HTN, anemia, morbid obesity who p/w R hip and leg pain. She reports several months of R hip pain that goes down into her leg when she stands up, painful enough that she's had to walk with a walker to assist. She takes naproxen BID for the pain without relief. She states she went to urgent care to get pain relief and was referred to ED for evaluation due to severe hypertension.   She reports "heat" when she urinates if she doesn't drink enough water. No N/V, fevers, or SOB. She reports CP only when she's holding herself up with the walker or when she's straining to try to stand up. She denies any CP currently.  When asked about the level of pain in her right hip and leg, she states she's not having pain currently because she's laying in bed but it hurts once she stands up.  She doesn't currently have a PCP and has been out of BP meds for 2 months. When asked which medications she was on, she initially refused to tell me then later mentioned several including HCTZ and "some yellow pill."  FH notable for mother w/ CHF.   The history is provided by the patient.  Hypertension       Past Medical History:  Diagnosis Date  . Anemia   . Blood transfusion without reported diagnosis   . Constipation, chronic   . Enlarged heart   . Hypertension   . Uterine fibroid     Patient Active Problem List   Diagnosis Date Noted  . Essential hypertension 08/06/2015  . Hypertensive heart disease 08/06/2015  . Morbid obesity ([place]) 08/06/2015  . Cardiac murmur 08/06/2015    Past Surgical History:  Procedure Laterality Date  . TUBAL LIGATION       OB History    Gravida  4   Para  4   Term      Preterm      AB      Living  4      SAB      IAB      Ectopic      Multiple      Live Births              Family History  Problem Relation Age of Onset  . Hypertension Mother   . Heart failure Mother   . Alzheimer's disease Mother     Social History   Tobacco Use  . Smoking status: Former Smoker    Packs/day: 0.25    Types: Cigarettes    Start date: 10/07/2015  . Smokeless tobacco: Current User  . Tobacco comment: 0.5 packs per week  Vaping Use  . Vaping Use: Never used  Substance Use Topics  . Alcohol use: Yes    Alcohol/week: 0.0 standard drinks    Comment: 1 pint per week  . Drug use: No    Home Medications Prior to Admission medications   Medication Sig Start Date End Date Taking? Authorizing Provider  amLODipine (NORVASC) 10 MG tablet Take 1 tablet (10 mg total) by mouth daily. 02/21/21  Yes Tamura Lasky, Wenda Overland, MD  aspirin EC 81 MG tablet Take 81 mg by mouth daily.  [provider]  baclofen (LIORESAL) 10 MG tablet Take 1 tablet (10 mg total) by mouth 3 (three) times daily as needed for muscle spasms. 05/06/20   Hudnall, Sharyn Lull, MD  losartan-hydrochlorothiazide (HYZAAR) 100-25 MG tablet Take 1 tablet by mouth daily. 07/22/20   Kerin Perna, NP  colchicine 0.6 MG tablet Take 1 tablet (0.6 mg total) by mouth 2 (two) times daily. 11/03/18 08/12/19  Orvan July, NP    Allergies    Penicillins  Review of Systems   Review of Systems All other systems reviewed and are negative except that which was mentioned in HPI  Physical Exam Updated Vital Signs BP (!) 205/95   Pulse (!) 52   Temp 98.9 F (37.2 C) (Oral)   Resp 18   SpO2 98%   Physical Exam Constitutional:      General: She is not in acute distress.    Appearance: Normal appearance. She is obese.  HENT:     Head: Normocephalic and atraumatic.  Eyes:     Conjunctiva/sclera: Conjunctivae normal.  Cardiovascular:     Rate and Rhythm: Normal rate and regular rhythm.     Heart sounds: Normal heart sounds. No  murmur heard.   Pulmonary:     Effort: Pulmonary effort is normal.     Breath sounds: Normal breath sounds.  Abdominal:     General: Abdomen is flat. Bowel sounds are normal. There is no distension.     Palpations: Abdomen is soft.     Tenderness: There is no abdominal tenderness.  Musculoskeletal:        General: No swelling or tenderness. Normal range of motion.     Right lower leg: No edema.     Left lower leg: No edema.  Skin:    General: Skin is warm and dry.  Neurological:     Mental Status: She is alert and oriented to person, place, and time.     Comments: fluent  Psychiatric:        Mood and Affect: Affect is labile.        Judgment: Judgment is inappropriate.     ED Results / Procedures / Treatments   Labs (all labs ordered are listed, but only abnormal results are displayed) Labs Reviewed  BASIC METABOLIC PANEL - Abnormal; Notable for the following components:      Result Value   BUN 23 (*)    Creatinine, Ser 1.21 (*)    GFR, Estimated 54 (*)    All other components within normal limits  CBC - Abnormal; Notable for the following components:   MCH 25.9 (*)    Platelets 133 (*)    All other components within normal limits  TROPONIN I (HIGH SENSITIVITY) - Abnormal; Notable for the following components:   Troponin I (High Sensitivity) 23 (*)    All other components within normal limits  URINALYSIS, ROUTINE W REFLEX MICROSCOPIC  I-STAT BETA HCG BLOOD, ED ([facility], [facility], [facility] ONLY)  TROPONIN I (HIGH SENSITIVITY)  TROPONIN I (HIGH SENSITIVITY)    EKG EKG Interpretation  Date/Time:  Saturday February 20 2021 20:23:18 EST Ventricular Rate:  52 PR Interval:  182 QRS Duration: 92 QT Interval:  482 QTC Calculation: 448 R Axis:   33 Text Interpretation: Sinus bradycardia Otherwise normal ECG rate slower otherwise similar to previous Confirmed by Theotis Burrow [phone]) on 02/21/2021 12:45:26 AM   Radiology DG Lumbar Spine Complete  Result Date: 02/21/2021 CLINICAL DATA:   Right hip, low back pain EXAM:  LUMBAR SPINE - COMPLETE 4+ VIEW COMPARISON:  None. FINDINGS: Normal lumbar lordosis. No acute fracture or listhesis of the lumbar spine. Vertebral body height has been preserved. There is mild intervertebral disc space narrowing and subtle endplate remodeling throughout the lumbar spine compatible with mild diffuse degenerative disc disease. Facet arthrosis at L4-S1 is not well profiled on this examination. The paraspinal soft tissues are unremarkable. IMPRESSION: Mild degenerative change.  No acute fracture or listhesis. Electronically Signed   By: Fidela Salisbury MD   On: 02/21/2021 01:46   DG Hip Unilat With Pelvis 2-3 Views Right  Result Date: 02/21/2021 CLINICAL DATA:  Right hip pain EXAM: DG HIP (WITH OR WITHOUT PELVIS) 2-3V RIGHT COMPARISON:  None. FINDINGS: There is no evidence of hip fracture or dislocation. There is no evidence of arthropathy or other focal bone abnormality. IMPRESSION: Negative. Electronically Signed   By: Fidela Salisbury MD   On: 02/21/2021 01:47    Procedures Procedures   Medications Ordered in ED Medications  hydrALAZINE (APRESOLINE) injection 10 mg (has no administration in time range)  amLODipine (NORVASC) tablet 10 mg (has no administration in time range)    ED Course  I have reviewed the triage vital signs and the nursing notes.  Pertinent labs & imaging results that were available during my care of the patient were reviewed by me and considered in my medical decision making (see chart for details).    MDM Rules/Calculators/A&P                          PT alert, NAD on exam, BP elevated in 200s. Denying chest pain, EKG w/ sinus brady and no ischemic changes. No concerning neurologic symptoms but I am concerned about possible HTN emergency/urgency. Labs show mildly elevated Cr at 1.2, trop 23. I began a long discussion regarding my concerns for her BP contributing to elevated trop and creatinine and need for emergent management  including IV antihypertensives w/ goal of sBP 180. She refused IV. I tried to explain that her labwork is concerning for end-organ damage due to elevated BP and explained that she could suffer permanent disability or even death if BP isn't managed. She states she did not come here for her BP, that her BP was a reflection of her hip pain, and she refused to receive any further treatment for BP.   Hip and leg pain are chronic in nature, ddx includes low back pain with sciatica vs degenerative changes of hip joint. XRs negative acute,  I explained that she shouldn't take NSAIDs due to her current creatinine but could take tylenol. I explained that I do not prescribe narcotics for chronic pain conditions. She stated she did not want narcotics but then also stated she couldn't take tylenol because it makes her chest hurt and the NSAIDs aren't helping and all she came to ED for was pain relief. Again, I tried to reiterate that my primary concern is her elevated blood pressure and slightly elevated trop and creatinine. She continued to refuse IV antihypertensives or inpatient treatment for this problem. She then began insisting that she had a UTI for which she needed treatment. UA from UC just PTA shows protein, nitrite and leukocyte negative not suggestive of infection. I explained that I would not give antibiotics based on these results. Patient continued to adamantly request pain medication for her hip, stating that her blood pressure is elevated only because of her severe pain. I eventually had to  leave the room as she refused to listen to my explanations or concerns for her other more pressing issues. Patient refused to sign AMA papers.  Final Clinical Impression(s) / ED Diagnoses Final diagnoses:  Hypertensive urgency  Right hip pain    Rx / DC Orders ED Discharge Orders         Ordered    amLODipine (NORVASC) 10 MG tablet  Daily        02/21/21 0305           Caleen Taaffe, Wenda Overland, MD 02/21/21  [phone]

## 2021-02-21 NOTE — ED Notes (Signed)
Patient refused to allow RN to go over discharge instructions or wheel her out in a wheelchair, family did so.

## 2021-02-21 NOTE — ED Notes (Signed)
Pt refusing IV start and medications at this time. EDP at bedside. Pt agreeable to have xrays done at this time.

## 2021-05-18 ENCOUNTER — Emergency Department (HOSPITAL_COMMUNITY)
Admission: EM | Admit: 2021-05-18 | Discharge: 2021-05-18 | Disposition: A | Discharge status: Home / Self Care | Payer: Self-pay

## 2021-05-18 NOTE — ED Notes (Signed)
Pt seen walking out of lobby.

## 2021-05-18 NOTE — ED Notes (Signed)
Triage NT called pts name to come to triage with no response. Pt not seen in lobby or outside at this time.

## 2021-07-05 IMAGING — CR DG HIP (WITH OR WITHOUT PELVIS) 2-3V*R*
3 series · 3 of 3 positions shown · non-contrast
Comparison: None.

CLINICAL DATA: Right hip pain

EXAM:
DG HIP (WITH OR WITHOUT PELVIS) 2-3V RIGHT

[pelvis ap]
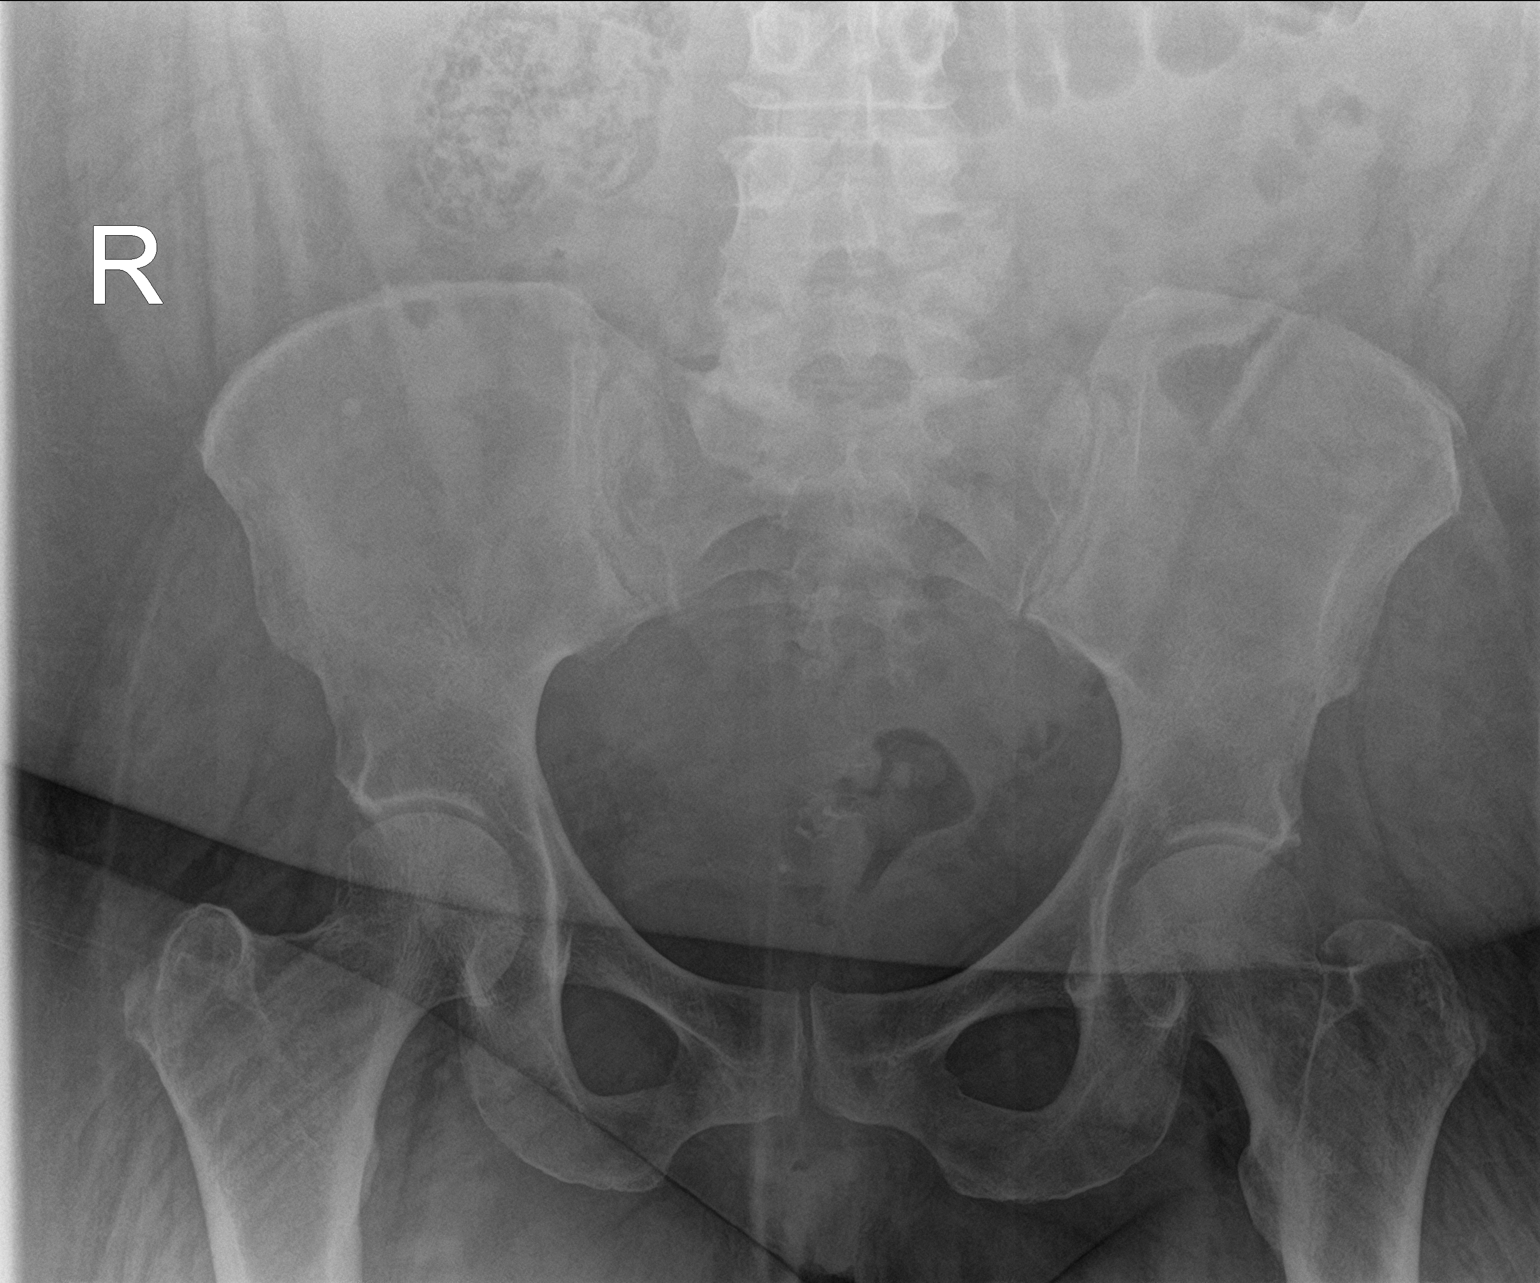

[hip ap]
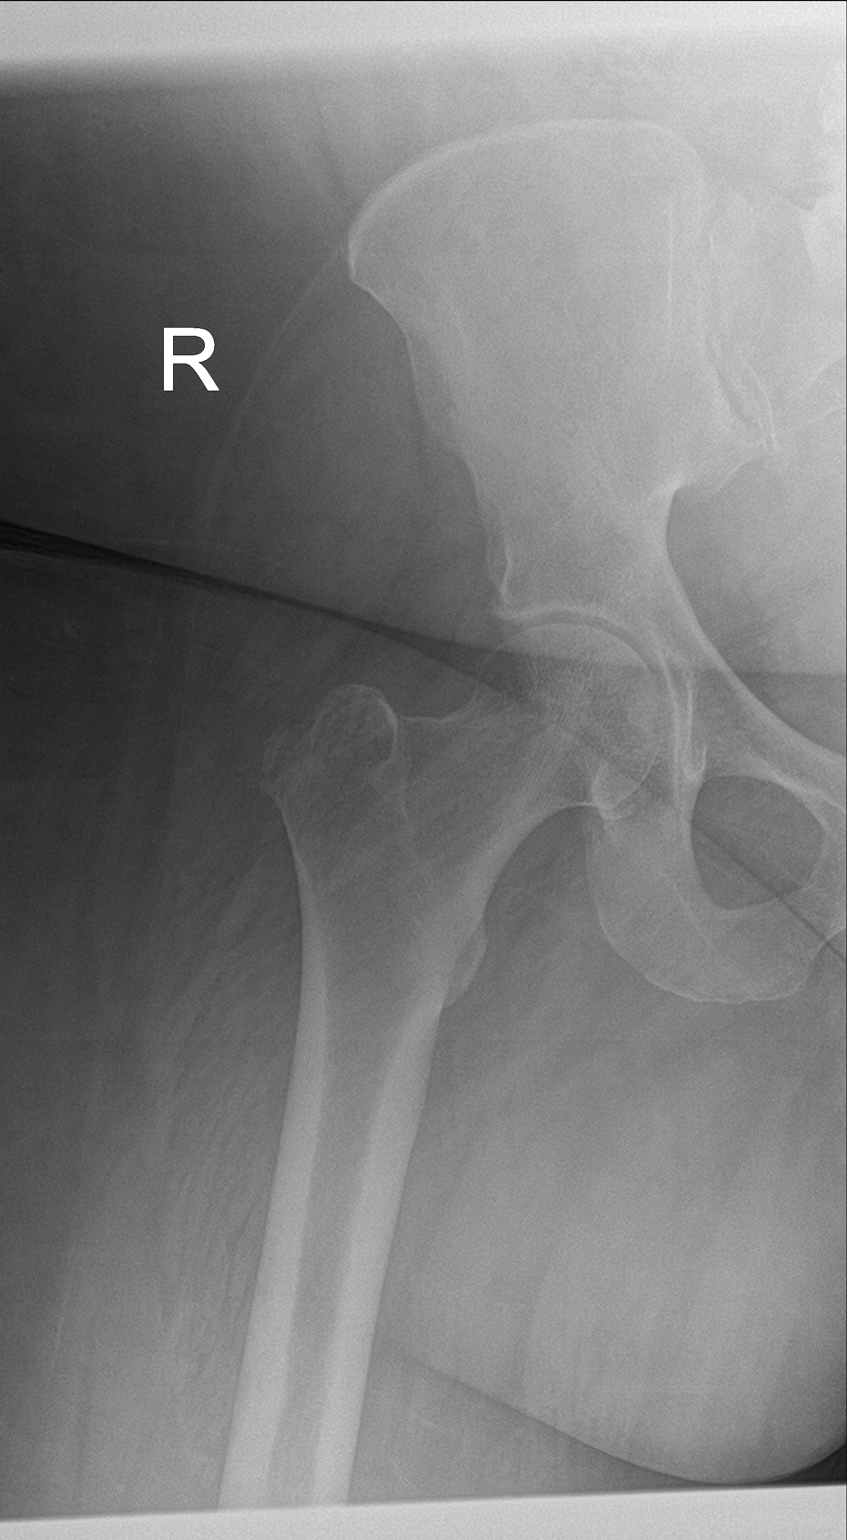

[hip lat]
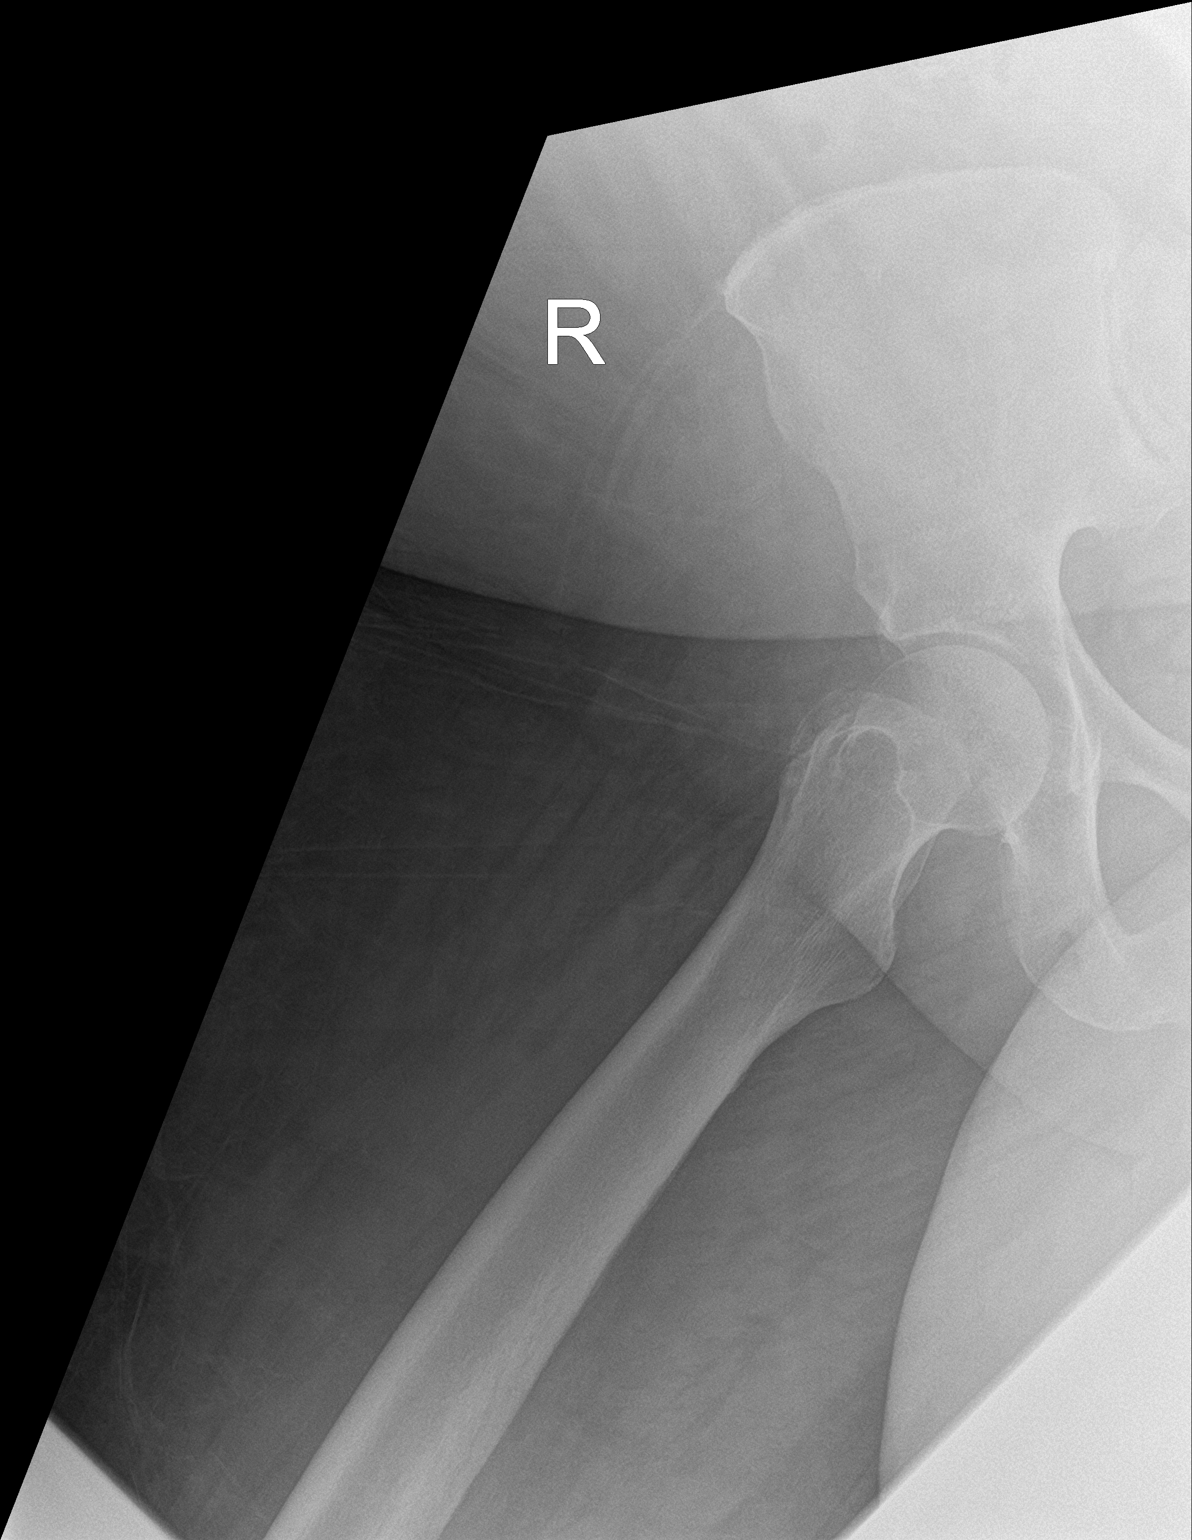

[3 of 3 positions shown; findings below may reference images not displayed]

FINDINGS: There is no evidence of hip fracture or dislocation. There is no
evidence of arthropathy or other focal bone abnormality.
IMPRESSION: Negative.

## 2021-08-31 ENCOUNTER — Other Ambulatory Visit: Payer: Self-pay

## 2021-08-31 ENCOUNTER — Ambulatory Visit (INDEPENDENT_AMBULATORY_CARE_PROVIDER_SITE_OTHER): Payer: Self-pay | Admitting: Primary Care

## 2021-08-31 ENCOUNTER — Encounter (INDEPENDENT_AMBULATORY_CARE_PROVIDER_SITE_OTHER): Payer: Self-pay | Admitting: Primary Care

## 2021-08-31 DIAGNOSIS — M25561 Pain in right knee: Secondary | ICD-10-CM

## 2021-08-31 DIAGNOSIS — I10 Essential (primary) hypertension: Secondary | ICD-10-CM

## 2021-08-31 DIAGNOSIS — F331 Major depressive disorder, recurrent, moderate: Secondary | ICD-10-CM

## 2021-08-31 DIAGNOSIS — Z6841 Body Mass Index (BMI) 40.0 and over, adult: Secondary | ICD-10-CM

## 2021-08-31 DIAGNOSIS — G8929 Other chronic pain: Secondary | ICD-10-CM

## 2021-08-31 DIAGNOSIS — M25562 Pain in left knee: Secondary | ICD-10-CM

## 2021-08-31 MED ORDER — AMLODIPINE BESYLATE 10 MG PO TABS
10.0000 mg | ORAL_TABLET | Freq: Every day | ORAL | 1 refills | Status: DC
  Filled 2021-08-31: qty 30, 30d supply, fill #0
  Filled 2021-10-05 – 2021-10-15 (×2): qty 30, 30d supply, fill #1

## 2021-08-31 MED ORDER — AMITRIPTYLINE HCL 150 MG PO TABS
150.0000 mg | ORAL_TABLET | Freq: Every day | ORAL | 1 refills | Status: DC
  Filled 2021-08-31: qty 30, 30d supply, fill #0

## 2021-08-31 MED ORDER — HYDROCHLOROTHIAZIDE 25 MG PO TABS
25.0000 mg | ORAL_TABLET | Freq: Every day | ORAL | 3 refills | Status: DC
  Filled 2021-08-31: qty 30, 30d supply, fill #0
  Filled 2021-10-05 – 2021-10-15 (×2): qty 30, 30d supply, fill #1

## 2021-08-31 NOTE — Progress Notes (Signed)
Established Patient Office Visit  Subjective:  Patient ID: Vickie Patel, female    DOB: [birth year]  Age: 50 y.o. MRN: [id number]  CC:  Chief Complaint  Patient presents with   Edema    feet   Poor Circulation   Hypertension    HPI [name] is a 50 year old morbid obese female who presents for hypertension management-not on medication due to affordability, lower extremity edema secondary to poor circulation.  Reviewing records indicated she needed to be seen by vascular.  Today explained to her she needed to apply for, financial for assistance and referrals can be made within the system. Denies shortness of breath and headaches. She does have chest pain that is located in the middle of her chest -denies radiating , numbness or tingling to her neck or arms.  She does have lower extremity edema and gained 40 lbs over 9 months.  Past Medical History:  Diagnosis Date   Anemia    Blood transfusion without reported diagnosis    Constipation, chronic    Enlarged heart    Hypertension    Uterine fibroid     Past Surgical History:  Procedure Laterality Date   TUBAL LIGATION      Family History  Problem Relation Age of Onset   Hypertension Mother    Heart failure Mother    Alzheimer's disease Mother     Social History   Socioeconomic History   Marital status: Legally Separated    Spouse name: Not on file   Number of children: Not on file   Years of education: Not on file   Highest education level: Not on file  Occupational History   Not on file  Tobacco Use   Smoking status: Former    Packs/day: 0.25    Types: Cigarettes    Start date: 10/07/2015   Smokeless tobacco: Current   Tobacco comments:    0.5 packs per week  Vaping Use   Vaping Use: Never used  Substance and Sexual Activity   Alcohol use: Yes    Alcohol/week: 0.0 standard drinks    Comment: 1 pint per week   Drug use: No   Sexual activity: Yes    Birth control/protection: Surgical  Other  Topics Concern   Not on file  Social History Narrative   Not on file   Social Determinants of Health   Financial Resource Strain: Not on file  Food Insecurity: Not on file  Transportation Needs: Not on file  Physical Activity: Not on file  Stress: Not on file  Social Connections: Not on file  Intimate Partner Violence: Not on file    Outpatient Medications Prior to Visit  Medication Sig Dispense Refill   aspirin EC 81 MG tablet Take 81 mg by mouth daily. (Patient not taking: Reported on 08/31/2021)     baclofen (LIORESAL) 10 MG tablet Take 1 tablet (10 mg total) by mouth 3 (three) times daily as needed for muscle spasms. (Patient not taking: Reported on 08/31/2021) 60 tablet 1   amLODipine (NORVASC) 10 MG tablet Take 1 tablet (10 mg total) by mouth daily. (Patient not taking: Reported on 08/31/2021) 30 tablet 0   losartan-hydrochlorothiazide (HYZAAR) 100-25 MG tablet Take 1 tablet by mouth daily. (Patient not taking: Reported on 08/31/2021) 90 tablet 3   No facility-administered medications prior to visit.    Allergies  Allergen Reactions   Penicillins Hives    Childhood reaction unknown    ROS Review of Systems  Constitutional:  Positive for appetite change and fatigue.       Depressed and eats for comfort   Respiratory:  Positive for shortness of breath.        With exertion   Endocrine: Positive for polydipsia and polyphagia.  Genitourinary:  Positive for urgency.  Musculoskeletal:  Positive for gait problem.       Bilateral hip pain and big toe right secondary to gout   Psychiatric/Behavioral:         Depression  All other systems reviewed and are negative.    Objective:    Physical Exam Vitals reviewed.  Constitutional:      Appearance: She is obese.  HENT:     Head: Normocephalic and atraumatic.     Right Ear: Tympanic membrane and external ear normal.     Left Ear: Tympanic membrane and external ear normal.     Nose: Nose normal.  Eyes:     Extraocular  Movements: Extraocular movements intact.  Cardiovascular:     Rate and Rhythm: Normal rate and regular rhythm.  Pulmonary:     Effort: Pulmonary effort is normal.     Breath sounds: Normal breath sounds.  Abdominal:     General: Bowel sounds are normal. There is distension.     Palpations: Abdomen is soft.  Musculoskeletal:        General: Normal range of motion.     Cervical back: Normal range of motion and neck supple.  Skin:    General: Skin is warm and dry.  Neurological:     Mental Status: She is alert and oriented to person, place, and time.  Psychiatric:        Mood and Affect: Mood normal.        Behavior: Behavior normal.        Thought Content: Thought content normal.        Judgment: Judgment normal.   BP (!) 158/109 (BP Location: Left Arm, Patient Position: Sitting, Cuff Size: Large)   Pulse (!) 58   Temp (!) 97.2 F (36.2 C) (Temporal)   Ht '5\' 5"'$  (1.651 m)   Wt (!) 341 lb 6.4 oz (154.9 kg)   SpO2 95%   BMI 56.81 kg/m  Wt Readings from Last 3 Encounters:  08/31/21 (!) 341 lb 6.4 oz (154.9 kg)  09/10/20 300 lb (136.1 kg)  07/22/20 (!) 328 lb 12.8 oz (149.1 kg)     Health Maintenance Due  Topic Date Due   COVID-19 Vaccine (1) Never done   Hepatitis C Screening  Never done   TETANUS/TDAP  Never done   PAP SMEAR-Modifier  Never done   COLONOSCOPY (Pts 45-58yr Insurance coverage will need to be confirmed)  Never done   MAMMOGRAM  Never done   Zoster Vaccines- Shingrix (1 of 2) Never done   INFLUENZA VACCINE  Never done    There are no preventive care reminders to display for this patient.  No results found for: TSH Lab Results  Component Value Date   WBC 5.0 02/20/2021   HGB 13.0 02/20/2021   HCT 42.9 02/20/2021   MCV 85.5 02/20/2021   PLT 133 (L) 02/20/2021   Lab Results  Component Value Date   NA 142 02/20/2021   K 3.9 02/20/2021   CO2 25 02/20/2021   GLUCOSE 76 02/20/2021   BUN 23 (H) 02/20/2021   CREATININE 1.21 (H) 02/20/2021   BILITOT  0.3 07/09/2014   ALKPHOS 43 07/09/2014   AST 13 07/09/2014   ALT 8  07/09/2014   PROT 7.2 07/09/2014   ALBUMIN 2.8 (L) 07/09/2014   CALCIUM 8.9 02/20/2021   ANIONGAP 9 02/20/2021   No results found for: CHOL No results found for: HDL No results found for: LDLCALC No results found for: TRIG No results found for: CHOLHDL Lab Results  Component Value Date   HGBA1C 5.1 07/22/2020      Assessment & Plan:  Vickie Patel was seen today for edema, poor circulation and hypertension.  Diagnoses and all orders for this visit:  Morbid obesity ([place]) Severe Morbid Obesity is >40 indicating an excess in caloric intake or underlining conditions. This may lead to other co-morbidities. Lifestyle modifications of diet and exercise may reduce obesity.  Referred to nutritionist    Essential hypertension Counseled on blood pressure goal of less than 130/80, low-sodium, DASH diet, medication compliance, 150 minutes of moderate intensity exercise per week. Discussed medication compliance, adverse effects.  -     amLODipine (NORVASC) 10 MG tablet; Take 1 tablet (10 mg total) by mouth daily. -     hydrochlorothiazide (HYDRODIURIL) 25 MG tablet; Take 1 tablet (25 mg total) by mouth daily.  Chronic pain of both knees Work on losing weight to help reduce joint pain. May alternate with heat and ice application for pain relief. May also alternate with acetaminophen and Ibuprofen for  pain relief. Other alternatives include massage, acupuncture and water aerobics.  You must stay active and avoid a sedentary lifestyle.  -     amitriptyline (ELAVIL) 150 MG tablet; Take 1 tablet (150 mg total) by mouth at bedtime.  Moderate episode of recurrent major depressive disorder ([place]) [place] Visit from 07/22/2020 in [place]  PHQ-9 Total Score 18       amitriptyline (ELAVIL) 150 MG tablet; Take 1 tablet (150 mg total) by mouth at bedtime.     Follow-up: Return in about 7 weeks  (around 10/19/2021) for Bp check and fasting labs .    Kerin Perna, NP

## 2021-08-31 NOTE — Patient Instructions (Signed)
Hypertension, Adult High blood pressure (hypertension) is when the force of blood pumping through the arteries is too strong. The arteries are the blood vessels that carry blood from the heart throughout the body. Hypertension forces the heart to work harder to pump blood and may cause arteries to become narrow or stiff. Untreated or uncontrolled hypertension can cause a heart attack, heart failure, a stroke, kidney disease, and other problems. A blood pressure reading consists of a higher number over a lower number. Ideally, your blood pressure should be below 120/80. The first ("top") number is called the systolic pressure. It is a measure of the pressure in your arteries as your heart beats. The second ("bottom") number is called the diastolic pressure. It is a measure of the pressure in your arteries as the heart relaxes. What are the causes? The exact cause of this condition is not known. There are some conditions that result in or are related to high blood pressure. What increases the risk? Some risk factors for high blood pressure are under your control. The following factors may make you more likely to develop this condition: Smoking. Having type 2 diabetes mellitus, high cholesterol, or both. Not getting enough exercise or physical activity. Being overweight. Having too much fat, sugar, calories, or salt (sodium) in your diet. Drinking too much alcohol. Some risk factors for high blood pressure may be difficult or impossible to change. Some of these factors include: Having chronic kidney disease. Having a family history of high blood pressure. Age. Risk increases with age. Race. You may be at higher risk if you are African American. Gender. Men are at higher risk than women before age 45. After age 65, women are at higher risk than men. Having obstructive sleep apnea. Stress. What are the signs or symptoms? High blood pressure may not cause symptoms. Very high blood pressure  (hypertensive crisis) may cause: Headache. Anxiety. Shortness of breath. Nosebleed. Nausea and vomiting. Vision changes. Severe chest pain. Seizures. How is this diagnosed? This condition is diagnosed by measuring your blood pressure while you are seated, with your arm resting on a flat surface, your legs uncrossed, and your feet flat on the floor. The cuff of the blood pressure monitor will be placed directly against the skin of your upper arm at the level of your heart. It should be measured at least twice using the same arm. Certain conditions can cause a difference in blood pressure between your right and left arms. Certain factors can cause blood pressure readings to be lower or higher than normal for a short period of time: When your blood pressure is higher when you are in a health care provider's office than when you are at home, this is called white coat hypertension. Most people with this condition do not need medicines. When your blood pressure is higher at home than when you are in a health care provider's office, this is called masked hypertension. Most people with this condition may need medicines to control blood pressure. If you have a high blood pressure reading during one visit or you have normal blood pressure with other risk factors, you may be asked to: Return on a different day to have your blood pressure checked again. Monitor your blood pressure at home for 1 week or longer. If you are diagnosed with hypertension, you may have other blood or imaging tests to help your health care provider understand your overall risk for other conditions. How is this treated? This condition is treated by making   healthy lifestyle changes, such as eating healthy foods, exercising more, and reducing your alcohol intake. Your health care provider may prescribe medicine if lifestyle changes are not enough to get your blood pressure under control, and if: Your systolic blood pressure is above  130. Your diastolic blood pressure is above 80. Your personal target blood pressure may vary depending on your medical conditions, your age, and other factors. Follow these instructions at home: Eating and drinking  Eat a diet that is high in fiber and potassium, and low in sodium, added sugar, and fat. An example eating plan is called the DASH (Dietary Approaches to Stop Hypertension) diet. To eat this way: Eat plenty of fresh fruits and vegetables. Try to fill one half of your plate at each meal with fruits and vegetables. Eat whole grains, such as whole-wheat pasta, brown rice, or whole-grain bread. Fill about one fourth of your plate with whole grains. Eat or drink low-fat dairy products, such as skim milk or low-fat yogurt. Avoid fatty cuts of meat, processed or cured meats, and poultry with skin. Fill about one fourth of your plate with lean proteins, such as fish, chicken without skin, beans, eggs, or tofu. Avoid pre-made and processed foods. These tend to be higher in sodium, added sugar, and fat. Reduce your daily sodium intake. Most people with hypertension should eat less than 1,500 mg of sodium a day. Do not drink alcohol if: Your health care provider tells you not to drink. You are pregnant, may be pregnant, or are planning to become pregnant. If you drink alcohol: Limit how much you use to: 0-1 drink a day for women. 0-2 drinks a day for men. Be aware of how much alcohol is in your drink. In the [place] one drink equals one 12 oz bottle of beer (355 mL), one 5 oz glass of wine (148 mL), or one 1 oz glass of hard liquor (44 mL). Lifestyle  Work with your health care provider to maintain a healthy body weight or to lose weight. Ask what an ideal weight is for you. Get at least 30 minutes of exercise most days of the week. Activities may include walking, swimming, or biking. Include exercise to strengthen your muscles (resistance exercise), such as Pilates or lifting weights, as  part of your weekly exercise routine. Try to do these types of exercises for 30 minutes at least 3 days a week. Do not use any products that contain nicotine or tobacco, such as cigarettes, e-cigarettes, and chewing tobacco. If you need help quitting, ask your health care provider. Monitor your blood pressure at home as told by your health care provider. Keep all follow-up visits as told by your health care provider. This is important. Medicines Take over-the-counter and prescription medicines only as told by your health care provider. Follow directions carefully. Blood pressure medicines must be taken as prescribed. Do not skip doses of blood pressure medicine. Doing this puts you at risk for problems and can make the medicine less effective. Ask your health care provider about side effects or reactions to medicines that you should watch for. Contact a health care provider if you: Think you are having a reaction to a medicine you are taking. Have headaches that keep coming back (recurring). Feel dizzy. Have swelling in your ankles. Have trouble with your vision. Get help right away if you: Develop a severe headache or confusion. Have unusual weakness or numbness. Feel faint. Have severe pain in your chest or abdomen. Vomit repeatedly. Have trouble   breathing. Summary Hypertension is when the force of blood pumping through your arteries is too strong. If this condition is not controlled, it may put you at risk for serious complications. Your personal target blood pressure may vary depending on your medical conditions, your age, and other factors. For most people, a normal blood pressure is less than 120/80. Hypertension is treated with lifestyle changes, medicines, or a combination of both. Lifestyle changes include losing weight, eating a healthy, low-sodium diet, exercising more, and limiting alcohol. This information is not intended to replace advice given to you by your health care  provider. Make sure you discuss any questions you have with your health care provider. Document Revised: 08/15/2018 Document Reviewed: 08/15/2018 Elsevier Patient Education  2022 [place] [employer] Calories are units of energy. Your body needs a certain number of calories from food to keep going throughout the day. When you eat or drink more calories than your body needs, your body stores the extra calories mostly as fat. When you eat or drink fewer calories than your body needs, your body burns fat to get the energy it needs. Calorie counting means keeping track of how many calories you eat and drink each day. Calorie counting can be helpful if you need to lose weight. If you eat fewer calories than your body needs, you should lose weight. Ask your health care provider what a healthy weight is for you. For calorie counting to work, you will need to eat the right number of calories each day to lose a healthy amount of weight per week. A dietitian can help you figure out how many calories you need in a day and will suggest ways to reach your calorie goal. A healthy amount of weight to lose each week is usually 1-2 lb (0.5-0.9 kg). This usually means that your daily calorie intake should be reduced by 500-750 calories. Eating 1,200-1,500 calories a day can help most women lose weight. Eating 1,500-1,800 calories a day can help most men lose weight. What do I need to know about calorie counting? Work with your health care provider or dietitian to determine how many calories you should get each day. To meet your daily calorie goal, you will need to: Find out how many calories are in each food that you would like to eat. Try to do this before you eat. Decide how much of the food you plan to eat. Keep a food log. Do this by writing down what you ate and how many calories it had. To successfully lose weight, it is important to balance calorie counting with a healthy lifestyle  that includes regular activity. Where do I find calorie information? The number of calories in a food can be found on a Nutrition Facts label. If a food does not have a Nutrition Facts label, try to look up the calories online or ask your dietitian for help. Remember that calories are listed per serving. If you choose to have more than one serving of a food, you will have to multiply the calories per serving by the number of servings you plan to eat. For example, the label on a package of bread might say that a serving size is 1 slice and that there are 90 calories in a serving. If you eat 1 slice, you will have eaten 90 calories. If you eat 2 slices, you will have eaten 180 calories. How do I keep a food log? After each time that you eat, record the  following in your food log as soon as possible: What you ate. Be sure to include toppings, sauces, and other extras on the food. How much you ate. This can be measured in cups, ounces, or number of items. How many calories were in each food and drink. The total number of calories in the food you ate. Keep your food log near you, such as in a pocket-sized notebook or on an app or website on your mobile phone. Some programs will calculate calories for you and show you how many calories you have left to meet your daily goal. What are some portion-control tips? Know how many calories are in a serving. This will help you know how many servings you can have of a certain food. Use a measuring cup to measure serving sizes. You could also try weighing out portions on a kitchen scale. With time, you will be able to estimate serving sizes for some foods. Take time to put servings of different foods on your favorite plates or in your favorite bowls and cups so you know what a serving looks like. Try not to eat straight from a food's packaging, such as from a bag or box. Eating straight from the package makes it hard to see how much you are eating and can lead to  overeating. Put the amount you would like to eat in a cup or on a plate to make sure you are eating the right portion. Use smaller plates, glasses, and bowls for smaller portions and to prevent overeating. Try not to multitask. For example, avoid watching TV or using your computer while eating. If it is time to eat, sit down at a table and enjoy your food. This will help you recognize when you are full. It will also help you be more mindful of what and how much you are eating. What are tips for following this plan? Reading food labels Check the calorie count compared with the serving size. The serving size may be smaller than what you are used to eating. Check the source of the calories. Try to choose foods that are high in protein, fiber, and vitamins, and low in saturated fat, trans fat, and sodium. Shopping Read nutrition labels while you shop. This will help you make healthy decisions about which foods to buy. Pay attention to nutrition labels for low-fat or fat-free foods. These foods sometimes have the same number of calories or more calories than the full-fat versions. They also often have added sugar, starch, or salt to make up for flavor that was removed with the fat. Make a grocery list of lower-calorie foods and stick to it. Cooking Try to cook your favorite foods in a healthier way. For example, try baking instead of frying. Use low-fat dairy products. Meal planning Use more fruits and vegetables. One-half of your plate should be fruits and vegetables. Include lean proteins, such as chicken, [place], and fish. Lifestyle Each week, aim to do one of the following: 150 minutes of moderate exercise, such as walking. 75 minutes of vigorous exercise, such as running. General information Know how many calories are in the foods you eat most often. This will help you calculate calorie counts faster. Find a way of tracking calories that works for you. Get creative. Try different apps or  programs if writing down calories does not work for you. What foods should I eat?  Eat nutritious foods. It is better to have a nutritious, high-calorie food, such as an avocado, than a food with  few nutrients, such as a bag of potato chips. Use your calories on foods and drinks that will fill you up and will not leave you hungry soon after eating. Examples of foods that fill you up are nuts and nut butters, vegetables, lean proteins, and high-fiber foods such as whole grains. High-fiber foods are foods with more than 5 g of fiber per serving. Pay attention to calories in drinks. Low-calorie drinks include water and unsweetened drinks. The items listed above may not be a complete list of foods and beverages you can eat. Contact a dietitian for more information. What foods should I limit? Limit foods or drinks that are not good sources of vitamins, minerals, or protein or that are high in unhealthy fats. These include: Candy. Other sweets. Sodas, specialty coffee drinks, alcohol, and juice. The items listed above may not be a complete list of foods and beverages you should avoid. Contact a dietitian for more information. How do I count calories when eating out? Pay attention to portions. Often, portions are much larger when eating out. Try these tips to keep portions smaller: Consider sharing a meal instead of getting your own. If you get your own meal, eat only half of it. Before you start eating, ask for a container and put half of your meal into it. When available, consider ordering smaller portions from the menu instead of full portions. Pay attention to your food and drink choices. Knowing the way food is cooked and what is included with the meal can help you eat fewer calories. If calories are listed on the menu, choose the lower-calorie options. Choose dishes that include vegetables, fruits, whole grains, low-fat dairy products, and lean proteins. Choose items that are boiled, broiled,  grilled, or steamed. Avoid items that are buttered, battered, fried, or served with cream sauce. Items labeled as crispy are usually fried, unless stated otherwise. Choose water, low-fat milk, unsweetened iced tea, or other drinks without added sugar. If you want an alcoholic beverage, choose a lower-calorie option, such as a glass of wine or light beer. Ask for dressings, sauces, and syrups on the side. These are usually high in calories, so you should limit the amount you eat. If you want a salad, choose a garden salad and ask for grilled meats. Avoid extra toppings such as bacon, cheese, or fried items. Ask for the dressing on the side, or ask for olive oil and vinegar or lemon to use as dressing. Estimate how many servings of a food you are given. Knowing serving sizes will help you be aware of how much food you are eating at restaurants. Where to find more information Centers for Disease Control and Prevention: http://www.wolf.info/ [place] Department of Agriculture: http://www.wilson-mendoza.org/ Summary Calorie counting means keeping track of how many calories you eat and drink each day. If you eat fewer calories than your body needs, you should lose weight. A healthy amount of weight to lose per week is usually 1-2 lb (0.5-0.9 kg). This usually means reducing your daily calorie intake by 500-750 calories. The number of calories in a food can be found on a Nutrition Facts label. If a food does not have a Nutrition Facts label, try to look up the calories online or ask your dietitian for help. Use smaller plates, glasses, and bowls for smaller portions and to prevent overeating. Use your calories on foods and drinks that will fill you up and not leave you hungry shortly after a meal. This information is not intended to replace advice  given to you by your health care provider. Make sure you discuss any questions you have with your health care provider. Document Revised: 01/16/2020 Document Reviewed: 01/16/2020 Elsevier Patient  Education  2022 [employer].

## 2021-09-03 ENCOUNTER — Telehealth: Payer: Self-pay | Admitting: Clinical

## 2021-09-06 ENCOUNTER — Ambulatory Visit: Payer: Self-pay

## 2021-09-20 ENCOUNTER — Telehealth (INDEPENDENT_AMBULATORY_CARE_PROVIDER_SITE_OTHER): Payer: Self-pay | Admitting: Primary Care

## 2021-09-20 ENCOUNTER — Other Ambulatory Visit (INDEPENDENT_AMBULATORY_CARE_PROVIDER_SITE_OTHER): Payer: Self-pay | Admitting: Primary Care

## 2021-09-20 MED ORDER — COLCHICINE 0.6 MG PO TABS: 0.6000 mg | ORAL_TABLET | Freq: Every day | ORAL | 0 refills | Status: DC

## 2021-09-20 NOTE — Telephone Encounter (Signed)
Pt called stating that she spoke with PCP regarding getting medication from gout. She states that she is in a lot of pain and is requesting to have this sent to the pharmacy today if possible. Please advise.      [id number] [place], [place]  [address] [place] [address]  Phone: [phone] Fax: [phone]  Hours: Open 24 hours

## 2021-09-20 NOTE — Telephone Encounter (Signed)
Reviewed note from last OV. No mention of gout. Please advise.

## 2021-09-21 NOTE — Telephone Encounter (Signed)
Patient is aware that medication has been sent to pharmacy.

## 2021-09-24 NOTE — Telephone Encounter (Signed)
Spoke with pt and scheduled appt for 10/07/21 at 3:00pm

## 2021-10-05 ENCOUNTER — Other Ambulatory Visit: Payer: Self-pay

## 2021-10-05 ENCOUNTER — Encounter (INDEPENDENT_AMBULATORY_CARE_PROVIDER_SITE_OTHER): Payer: Self-pay | Admitting: Primary Care

## 2021-10-05 ENCOUNTER — Ambulatory Visit (INDEPENDENT_AMBULATORY_CARE_PROVIDER_SITE_OTHER): Payer: Self-pay | Admitting: Primary Care

## 2021-10-05 VITALS — BP 143/90 | HR 62 | Temp 97.2°F | Ht 65.0 in | Wt 341.4 lb

## 2021-10-05 DIAGNOSIS — M79604 Pain in right leg: Secondary | ICD-10-CM

## 2021-10-05 DIAGNOSIS — I10 Essential (primary) hypertension: Secondary | ICD-10-CM

## 2021-10-05 DIAGNOSIS — F331 Major depressive disorder, recurrent, moderate: Secondary | ICD-10-CM

## 2021-10-05 DIAGNOSIS — M79605 Pain in left leg: Secondary | ICD-10-CM

## 2021-10-05 MED ORDER — AMITRIPTYLINE HCL 25 MG PO TABS: 25.0000 mg | ORAL_TABLET | Freq: Every day | ORAL | 1 refills | Status: DC

## 2021-10-05 NOTE — Patient Instructions (Signed)
Amitriptyline Tablets What is this medication? AMITRIPTYLINE (a mee TRIP ti leen) treats depression. It increases the amount of serotonin and norepinephrine in the brain, hormones that help regulate mood. It belongs to a group of medications called tricyclic antidepressants (TCAs). This medicine may be used for other purposes; ask your health care provider or pharmacist if you have questions. COMMON BRAND NAME(S): Elavil, Vanatrip What should I tell my care team before I take this medication? They need to know if you have any of these conditions: An alcohol problem Asthma, trouble breathing Bipolar disorder or schizophrenia Difficulty passing urine, prostate trouble Glaucoma Heart disease or previous heart attack Liver disease Over active thyroid Seizures Thoughts or plans of suicide, a previous suicide attempt, or family history of suicide attempt An unusual or allergic reaction to amitriptyline, other medications, foods, dyes, or preservatives Pregnant or trying to get pregnant Breast-feeding How should I use this medication? Take this medication by mouth with a drink of water. Follow the directions on the prescription label. You can take the tablets with or without food. Take your medication at regular intervals. Do not take it more often than directed. Do not stop taking this medication suddenly except upon the advice of your care team. Stopping this medication too quickly may cause serious side effects or your condition may worsen. A special MedGuide will be given to you by the pharmacist with each prescription and refill. Be sure to read this information carefully each time. Talk to your care team regarding the use of this medication in children. Special care may be needed. Overdosage: If you think you have taken too much of this medicine contact a poison control center or emergency room at once. NOTE: This medicine is only for you. Do not share this medicine with others. What if I  miss a dose? If you miss a dose, take it as soon as you can. If it is almost time for your next dose, take only that dose. Do not take double or extra doses. What may interact with this medication? Do not take this medication with any of the following: Arsenic trioxide Certain medications used to regulate abnormal heartbeat or to treat other heart conditions Cisapride Droperidol Halofantrine Linezolid MAOIs like Carbex, Eldepryl, Marplan, Nardil, and Parnate Methylene blue Other medications for mental depression Phenothiazines like perphenazine, thioridazine and chlorpromazine Pimozide Probucol Procarbazine Sparfloxacin St. John's Wort This medication may also interact with the following: Atropine and related medications like hyoscyamine, scopolamine, tolterodine and others Barbiturate medications for inducing sleep or treating seizures, like phenobarbital Cimetidine Disulfiram Ethchlorvynol Thyroid hormones such as levothyroxine Ziprasidone This list may not describe all possible interactions. Give your health care provider a list of all the medicines, herbs, non-prescription drugs, or dietary supplements you use. Also tell them if you smoke, drink alcohol, or use illegal drugs. Some items may interact with your medicine. What should I watch for while using this medication? Tell your care team if your symptoms do not get better or if they get worse. Visit your care team for regular checks on your progress. Because it may take several weeks to see the full effects of this medication, it is important to continue your treatment as prescribed by your care team. Patients and their families should watch out for new or worsening thoughts of suicide or depression. Also watch out for sudden changes in feelings such as feeling anxious, agitated, panicky, irritable, hostile, aggressive, impulsive, severely restless, overly excited and hyperactive, or not being able to sleep. If  this happens,  especially at the beginning of treatment or after a change in dose, call your care team. You may get drowsy or dizzy. Do not drive, use machinery, or do anything that needs mental alertness until you know how this medication affects you. Do not stand or sit up quickly, especially if you are an older patient. This reduces the risk of dizzy or fainting spells. Alcohol may interfere with the effect of this medication. Avoid alcoholic drinks. Do not treat yourself for coughs, colds, or allergies without asking your care team for advice. Some ingredients can increase possible side effects. Your mouth may get dry. Chewing sugarless gum or sucking hard candy, and drinking plenty of water will help. Contact your care team if the problem does not go away or is severe. This medication may cause dry eyes and blurred vision. If you wear contact lenses you may feel some discomfort. Lubricating drops may help. See your eye doctor if the problem does not go away or is severe. This medication can cause constipation. Try to have a bowel movement at least every 2 to 3 days. If you do not have a bowel movement for 3 days, call your care team. This medication can make you more sensitive to the sun. Keep out of the sun. If you cannot avoid being in the sun, wear protective clothing and use sunscreen. Do not use sun lamps or tanning beds/booths. What side effects may I notice from receiving this medication? Side effects that you should report to your care team as soon as possible: Allergic reactions-skin rash, itching, hives, swelling of the face, lips, tongue, or throat Heart rhythm changes- fast or irregular heartbeat, dizziness, feeling faint or lightheaded, chest pain, trouble breathing Serotonin syndrome-irritability, confusion, fast or irregular heartbeat, muscle stiffness, twitching muscles, sweating, high fever, seizure, chills, vomiting, diarrhea Sudden eye pain or change in vision such as blurry vision, seeing halos  around lights, vision loss Thoughts of suicide or self-harm, worsening mood, feelings of depression Side effects that usually do not require medical attention (report to your care team if they continue or are bothersome): Change in appetite or weight Change in sex drive or performance Constipation Dizziness Drowsiness Dry mouth Tremors This list may not describe all possible side effects. Call your doctor for medical advice about side effects. You may report side effects to FDA at 1-800-FDA-1088. Where should I keep my medication? Keep out of the reach of children and pets. Store at room temperature between 20 and 25 degrees C (68 and 77 degrees F). Throw away any unused medication after the expiration date. NOTE: This sheet is a summary. It may not cover all possible information. If you have questions about this medicine, talk to your doctor, pharmacist, or health care provider.  2022 Elsevier/Gold Standard (2021-01-01 13:09:40)

## 2021-10-05 NOTE — Progress Notes (Signed)
HPI Vickie Patel 50 y.o.severe morbid obesity female presents for prescribed amitriptyline 150 mg medication made all of patient's problems go away because all she could do with sleep.  Decided this medication is too strong and any changes needed to be made.  Reduce medication to 25 mg at bedtime multipurpose depression, bipolar and neuropathy pain.  She also lower extremity weakness and pain. 10/10.    Past Medical History:  Diagnosis Date   Anemia    Blood transfusion without reported diagnosis    Constipation, chronic    Enlarged heart    Hypertension    Uterine fibroid      Allergies  Allergen Reactions   Penicillins Hives    Childhood reaction unknown      Current Outpatient Medications on File Prior to Visit  Medication Sig Dispense Refill   amLODipine (NORVASC) 10 MG tablet Take 1 tablet (10 mg total) by mouth daily. 90 tablet 1   aspirin EC 81 MG tablet Take 81 mg by mouth daily.     colchicine 0.6 MG tablet Take 1 tablet (0.6 mg total) by mouth daily. Take 1.2 mg x 1 then 0.6 mg by mouth in 1 hour may take 0.6 mg.  The next day take 0.6 mg twice daily for 3 days 15 tablet 0   hydrochlorothiazide (HYDRODIURIL) 25 MG tablet Take 1 tablet (25 mg total) by mouth daily. 90 tablet 3   baclofen (LIORESAL) 10 MG tablet Take 1 tablet (10 mg total) by mouth 3 (three) times daily as needed for muscle spasms. (Patient not taking: No sig reported) 60 tablet 1   No current facility-administered medications on file prior to visit.    ROS: all negative except above.   Physical Exam: Filed Weights   10/05/21 1552  Weight: (!) 341 lb 6.4 oz (154.9 kg)   BP (!) 143/90 (BP Location: Right Arm, Patient Position: Sitting, Cuff Size: Large)   Pulse 62   Temp (!) 97.2 F (36.2 C) (Temporal)   Ht 5\' 5"  (1.651 m)   Wt (!) 341 lb 6.4 oz (154.9 kg)   SpO2 94%   BMI 56.81 kg/m  General Appearance: Well nourished, morbid obesity in no apparent distress. Eyes: PERRLA, EOMs,  conjunctiva no swelling or erythema Sinuses: No Frontal/maxillary tenderness ENT/Mouth: Ext aud canals clear, TMs without erythema, bulging. No erythema, swelling, or exudate on post pharynx.  Tonsils not swollen or erythematous. Hearing normal.  Neck: Supple, thyroid normal.  Respiratory: Respiratory effort normal, BS equal bilaterally without rales, rhonchi, wheezing or stridor.  Cardio: RRR with no MRGs. Brisk peripheral pulses without edema.  Abdomen: Soft, + BS.  Non tender, no guarding, rebound, hernias, masses. Lymphatics: Non tender without lymphadenopathy.  Musculoskeletal: Full ROM, 5/5 strength, waddle gait.  Skin: Warm, dry without rashes, lesions, ecchymosis.  Neuro: Cranial nerves intact. Normal muscle tone, no cerebellar symptoms. Sensation intact.  Psych: Awake and oriented X 3, normal affect, Insight and Judgment appropriate.   Diagnoses and all orders for this visit:  Moderate episode of recurrent major depressive disorder (HCC) Reduced amitriptyline to 25 mg at bedtime will reevaluate effectiveness with less drowsiness and sleeping   Bilateral leg pain Work on losing weight to help reduce joint knee pain. May alternate with heat and ice application for pain relief. May also alternate with acetaminophen and Ibuprofen otc for pain relief. Other alternatives include massage, acupuncture and water aerobics.  You must stay active and avoid a sedentary lifestyle.  Essential hypertension Counseled on blood pressure goal of less than 130/80, low-sodium, DASH diet, medication compliance, 150 minutes of moderate intensity exercise per week. Discussed medication compliance, adverse effects. (Did not know refills were available and have not had medication in 1 week.)  Kerin Perna, NP 4:15 PM

## 2021-10-06 ENCOUNTER — Other Ambulatory Visit (INDEPENDENT_AMBULATORY_CARE_PROVIDER_SITE_OTHER): Payer: Self-pay | Admitting: Primary Care

## 2021-10-06 ENCOUNTER — Other Ambulatory Visit: Payer: Self-pay

## 2021-10-06 DIAGNOSIS — F331 Major depressive disorder, recurrent, moderate: Secondary | ICD-10-CM

## 2021-10-06 DIAGNOSIS — G8929 Other chronic pain: Secondary | ICD-10-CM

## 2021-10-06 DIAGNOSIS — M25562 Pain in left knee: Secondary | ICD-10-CM

## 2021-10-07 ENCOUNTER — Ambulatory Visit (INDEPENDENT_AMBULATORY_CARE_PROVIDER_SITE_OTHER): Payer: Self-pay | Admitting: Clinical

## 2021-10-07 ENCOUNTER — Other Ambulatory Visit: Payer: Self-pay

## 2021-10-07 DIAGNOSIS — F331 Major depressive disorder, recurrent, moderate: Secondary | ICD-10-CM

## 2021-10-07 DIAGNOSIS — F411 Generalized anxiety disorder: Secondary | ICD-10-CM

## 2021-10-13 ENCOUNTER — Other Ambulatory Visit: Payer: Self-pay

## 2021-10-13 ENCOUNTER — Ambulatory Visit (HOSPITAL_COMMUNITY): Admission: EM | Admit: 2021-10-13 | Discharge: 2021-10-13 | Discharge status: Against Medical Advice | Payer: Self-pay

## 2021-10-13 NOTE — ED Triage Notes (Signed)
Called pt in lobby, no response.

## 2021-10-13 NOTE — ED Notes (Signed)
Pt not answered, called in lobby.

## 2021-10-14 ENCOUNTER — Other Ambulatory Visit (INDEPENDENT_AMBULATORY_CARE_PROVIDER_SITE_OTHER): Payer: Self-pay | Admitting: Primary Care

## 2021-10-14 ENCOUNTER — Telehealth (INDEPENDENT_AMBULATORY_CARE_PROVIDER_SITE_OTHER): Payer: Self-pay

## 2021-10-14 DIAGNOSIS — M10079 Idiopathic gout, unspecified ankle and foot: Secondary | ICD-10-CM

## 2021-10-14 MED ORDER — COLCHICINE 0.6 MG PO TABS: 0.6000 mg | ORAL_TABLET | Freq: Every day | ORAL | 0 refills | Status: DC

## 2021-10-14 NOTE — Telephone Encounter (Signed)
Copied from [place] [id number]. Topic: General - Inquiry >> Oct 14, 2021 11:58 AM Greggory Keen D wrote: Reason for CRM: Pt called asking if Juluis Mire could send her a prescription for gout.  She has prescribed something before for her.  CB@ [phone]

## 2021-10-15 ENCOUNTER — Other Ambulatory Visit: Payer: Self-pay

## 2021-10-15 NOTE — BH Specialist Note (Signed)
Integrated Behavioral Health Initial In-Person Visit  MRN: [id number] Name: Vickie Patel  Number of [facility]ician visits:: 1/6 Session Start time: 3:20pm  Session End time: 4:10pm Total time: 50  minutes  Types of Service: Individual psychotherapy  Interpretor:No. Interpretor Name and Language: N/A   Warm Hand Off Completed.        Subjective: Vickie Patel is a 50 y.o. female accompanied by  self Patient was referred by PCP Juluis Mire, NP for depression and anxiety. Patient reports the following symptoms/concerns: Pt reports feeling depressed, decreased interest in activities, trouble sleeping, decreased energy, poor appetite, self-esteem disturbances, trouble concentrating, anxiousness, excessive worrying, trouble relaxing, irritability, restlessness, and racing thoughts. Reports that she has experienced physical health problems and believes that her mood is associated with her physical health. Reports that she needs assistance with medicaid/disability appeal but is not interested in therapy as she does not believe it would be beneficial.  Duration of problem: 2 years; Severity of problem: moderate  Objective: Mood: Anxious, Depressed, and Irritable and Affect: Labile Risk of harm to self or others: No plan to harm self or others  Life Context: Family and Social: Reports that fiance supports her. School/Work: Pt is unemployed and expressed that her fiance supports her.  Self-Care: Reports limited coping skills.  Life Changes: Reports that she has experienced physical health changes. Reports difficulty walking.  Patient and/or Family's Strengths/Protective Factors: Concrete supports in place (healthy food, safe environments, etc.)  Goals Addressed: Patient will: Reduce symptoms of: agitation, anxiety, depression, insomnia, and mood instability Increase knowledge and/or ability of: coping skills  Demonstrate ability to: Increase healthy  adjustment to current life circumstances  Progress towards Goals: Ongoing  Interventions: Interventions utilized: Supportive Counseling and Psychoeducation and/or Health Education  Standardized Assessments completed: GAD-7 and PHQ 9  Patient and/or Family Response: Pt had difficulty being receptive to tx. Pt does not believe therapy would assist her currently.   Patient Centered Plan: Patient is on the following Treatment Plan(s):  Depression and anxiety  Assessment: Denies SI/HI. Denies auditory/visual hallucinations. Patient currently experiencing depression and anxiety related to physical health problems. Pt exhibits pressured speech during session. Pt appears to not be motivated for tx.   Patient may benefit from therapy however pt is uninterested. LCSWA attempted to provided psychoeducation on the benefits of therapy and the association of depression and anxiety with physical health. LCSWA will refer pt to legal aid. Pt mentioned that she attends vocational rehabilitation and has seen a psychiatrist in the past but did not mention any medication. LCSWA advised pt to fu if needed.  Plan: Follow up with behavioral health clinician on : PRN Behavioral recommendations: FU with LCSWA if needed Referral(s): Community Resources:  Legal Aid "From scale of 1-10, how likely are you to follow plan?": 10  Abhimanyu Cruces C Aesha Agrawal, LCSW

## 2021-10-19 ENCOUNTER — Ambulatory Visit (INDEPENDENT_AMBULATORY_CARE_PROVIDER_SITE_OTHER): Payer: Self-pay | Admitting: Primary Care

## 2021-10-22 ENCOUNTER — Ambulatory Visit (INDEPENDENT_AMBULATORY_CARE_PROVIDER_SITE_OTHER): Payer: Self-pay | Admitting: Primary Care

## 2021-10-22 ENCOUNTER — Other Ambulatory Visit (INDEPENDENT_AMBULATORY_CARE_PROVIDER_SITE_OTHER): Payer: Self-pay | Admitting: Primary Care

## 2021-10-22 DIAGNOSIS — M79604 Pain in right leg: Secondary | ICD-10-CM

## 2021-10-22 DIAGNOSIS — F331 Major depressive disorder, recurrent, moderate: Secondary | ICD-10-CM

## 2021-10-22 NOTE — Telephone Encounter (Signed)
Requested medication (s) are due for refill today:   Yes  Requested medication (s) are on the active medication list:   Yes  Future visit scheduled:   No seen 2 wks ago   Last ordered: 10/05/2021 #30, 1 refill  Returned because pharmacy requesting a 90 day supply and a DX Code.   Requested Prescriptions  Pending Prescriptions Disp Refills   amitriptyline (ELAVIL) 25 MG tablet [Pharmacy Med Name: AMITRIPTYLINE HCL 25 MG TAB] 90 tablet 1    Sig: TAKE 1 TABLET BY MOUTH EVERYDAY AT BEDTIME     Psychiatry:  Antidepressants - Heterocyclics (TCAs) Passed - 10/22/2021 10:30 AM      Passed - Valid encounter within last 6 months    Recent Outpatient Visits           2 weeks ago Moderate episode of recurrent major depressive disorder ([place])   [place], Michelle P, NP   1 month ago Morbid obesity ([place])   [place] Kerin Perna, NP   1 year ago Encounter to establish care   [place] Kerin Perna, NP

## 2021-10-28 ENCOUNTER — Telehealth (INDEPENDENT_AMBULATORY_CARE_PROVIDER_SITE_OTHER): Payer: Self-pay | Admitting: Primary Care

## 2021-10-28 NOTE — Telephone Encounter (Signed)
Patient needs medication list faxed to vocational rehab. Not meds sent to [facility]. Per patient. She provided CMA with fax number to rehab. Medication list faxed.

## 2021-10-28 NOTE — Telephone Encounter (Signed)
Pt is requesting for medications to all be sent to  [facility] and [address] [place] [address]  Phone: [phone] Fax: [phone]

## 2021-11-02 ENCOUNTER — Other Ambulatory Visit: Payer: Self-pay

## 2021-11-02 ENCOUNTER — Encounter (INDEPENDENT_AMBULATORY_CARE_PROVIDER_SITE_OTHER): Payer: Self-pay | Admitting: Primary Care

## 2021-11-02 ENCOUNTER — Ambulatory Visit (INDEPENDENT_AMBULATORY_CARE_PROVIDER_SITE_OTHER): Payer: Self-pay | Admitting: Primary Care

## 2021-11-02 VITALS — BP 134/91 | HR 59 | Temp 97.3°F | Ht 65.0 in | Wt 334.8 lb

## 2021-11-02 DIAGNOSIS — Z1211 Encounter for screening for malignant neoplasm of colon: Secondary | ICD-10-CM

## 2021-11-02 DIAGNOSIS — M25552 Pain in left hip: Secondary | ICD-10-CM

## 2021-11-02 DIAGNOSIS — M25551 Pain in right hip: Secondary | ICD-10-CM

## 2021-11-02 MED ORDER — IBUPROFEN 400 MG PO TABS
400.0000 mg | ORAL_TABLET | Freq: Three times a day (TID) | ORAL | 1 refills | Status: DC | PRN
  Filled 2021-11-02 – 2022-01-27 (×3): qty 60, 20d supply, fill #0
  Filled 2022-03-16 – 2022-04-13 (×2): qty 60, 20d supply, fill #1

## 2021-11-02 MED ORDER — ACETAMINOPHEN 500 MG PO TABS
500.0000 mg | ORAL_TABLET | Freq: Four times a day (QID) | ORAL | 1 refills | Status: AC | PRN
  Filled 2021-11-02 – 2022-01-27 (×3): qty 30, 8d supply, fill #0

## 2021-11-02 NOTE — Progress Notes (Signed)
  [facility]   Subjective:    Vickie Patel is a 50 y.o. female who presents with bilateral hip pain. Onset of the symptoms was several months ago. Inciting event:  weight gain . The patient reports the hip pain is worse with weight bearing, is aggravated by walking, and is worse after period of inactivity. Aggravating symptoms include: any weight bearing, going up and down stairs, inactivity, lateral movements, rising after sitting, and walking. Patient has had prior hip problems. Previous visits for this problem: yes, last seen 1 month ago by the patient's PCP. Evaluation to date: none. Treatment to date: OTC analgesics, which have been somewhat effective.  The following portions of the patient's history were reviewed and updated as appropriate: allergies, current medications, past family history, past medical history, past social history, and past surgical history.   Review of Systems Pertinent items noted in HPI and remainder of comprehensive ROS otherwise negative.   Objective:    BP (!) 134/91 (BP Location: Right Arm, Patient Position: Sitting) Comment (Cuff Size): thigh  Pulse (!) 59   Temp (!) 97.3 F (36.3 C) (Temporal)   Ht 5\' 5"  (1.651 m)   Wt (!) 334 lb 12.8 oz (151.9 kg)   SpO2 100%   BMI 55.71 kg/m  Right hip: positives: decreased abduction, decreased flexion, decreased ROM, pain with flexion, pain with movement of hip, and pain with rotation  Left hip: positives: decreased abduction, decreased flexion, decreased ROM, pain with flexion, pain with movement of hip, and pain with rotation   Imaging: deferred  Rowen was seen today for depression and leg pain.  Diagnoses and all orders for this visit:  Colon cancer screening -     Fecal occult blood, imunochemical; Future  Hip pain, bilateral Work on losing weight to help reduce joint pain. May alternate with heat and ice application for pain relief. May also alternate with acetaminophen and Ibuprofen as  prescribed pain relief. Other alternatives include massage, acupuncture and water aerobics.  You must stay active and avoid a sedentary lifestyle.  -     ibuprofen (ADVIL) 400 MG tablet; Take 1 tablet (400 mg total) by mouth every 8 (eight) hours as needed. -     acetaminophen (TYLENOL) 500 MG tablet; Take 1 tablet (500 mg total) by mouth every 6 (six) hours as needed. -     Ambulatory referral to Orthopedic Surgery  Morbid obesity ([place]) Morbid severe Obesity is Body mass index is 55.71 kg/m.  indicating an excess in caloric intake or underlining conditions. Has lead to other co-morbidities. Lifestyle modifications of diet and exercise may reduce obesity.     Kerin Perna

## 2021-11-02 NOTE — Patient Instructions (Signed)
Hip Pain The hip is the joint between the upper legs and the lower pelvis. The bones, cartilage, tendons, and muscles of your hip joint support your body and allow you to move around. Hip pain can range from a minor ache to severe pain in one or both of your hips. The pain may be felt on the inside of the hip joint near the groin, or on the outside near the buttocks and upper thigh. You may also have swelling or stiffness in your hip area. Follow these instructions at home: Managing pain, stiffness, and swelling   If directed, put ice on the painful area. To do this: Put ice in a plastic bag. Place a towel between your skin and the bag. Leave the ice on for 20 minutes, 2-3 times a day. If directed, apply heat to the affected area as often as told by your health care provider. Use the heat source that your health care provider recommends, such as a moist heat pack or a heating pad. Place a towel between your skin and the heat source. Leave the heat on for 20-30 minutes. Remove the heat if your skin turns bright red. This is especially important if you are unable to feel pain, heat, or cold. You may have a greater risk of getting burned. Activity Do exercises as told by your health care provider. Avoid activities that cause pain. General instructions  Take over-the-counter and prescription medicines only as told by your health care provider. Keep a journal of your symptoms. Write down: How often you have hip pain. The location of your pain. What the pain feels like. What makes the pain worse. Sleep with a pillow between your legs on your most comfortable side. Keep all follow-up visits as told by your health care provider. This is important. Contact a health care provider if: You cannot put weight on your leg. Your pain or swelling continues or gets worse after one week. It gets harder to walk. You have a fever. Get help right away if: You fall. You have a sudden increase in pain and  swelling in your hip. Your hip is red or swollen or very tender to touch. Summary Hip pain can range from a minor ache to severe pain in one or both of your hips. The pain may be felt on the inside of the hip joint near the groin, or on the outside near the buttocks and upper thigh. Avoid activities that cause pain. Write down how often you have hip pain, the location of the pain, what makes it worse, and what it feels like. This information is not intended to replace advice given to you by your health care provider. Make sure you discuss any questions you have with your health care provider. Document Revised: 04/22/2019 Document Reviewed: 04/22/2019 Elsevier Patient Education  2022 Elsevier Inc.  

## 2021-11-03 ENCOUNTER — Other Ambulatory Visit: Payer: Self-pay

## 2021-11-08 ENCOUNTER — Other Ambulatory Visit (INDEPENDENT_AMBULATORY_CARE_PROVIDER_SITE_OTHER): Payer: Self-pay | Admitting: Primary Care

## 2021-11-08 ENCOUNTER — Telehealth (INDEPENDENT_AMBULATORY_CARE_PROVIDER_SITE_OTHER): Payer: Self-pay | Admitting: Primary Care

## 2021-11-08 ENCOUNTER — Other Ambulatory Visit: Payer: Self-pay

## 2021-11-08 DIAGNOSIS — I10 Essential (primary) hypertension: Secondary | ICD-10-CM

## 2021-11-08 DIAGNOSIS — M10079 Idiopathic gout, unspecified ankle and foot: Secondary | ICD-10-CM

## 2021-11-08 NOTE — Telephone Encounter (Signed)
Please add diagnosis to rx

## 2021-11-08 NOTE — Telephone Encounter (Signed)
Medication Refill - Medication:  hydrochlorothiazide (HYDRODIURIL) 25 MG tablet amLODipine (NORVASC) 10 MG tablet     Has the patient contacted their pharmacy? Yes.   Contact PCP, only one medication sent over  Preferred Pharmacy (with phone number or street name):  [facility] [address] Phone:  [phone]  Fax:  [phone]      Has the patient been seen for an appointment in the last year OR does the patient have an upcoming appointment? Yes.    Agent: Please be advised that RX refills may take up to 3 business days. We ask that you follow-up with your pharmacy.

## 2021-11-10 ENCOUNTER — Other Ambulatory Visit (INDEPENDENT_AMBULATORY_CARE_PROVIDER_SITE_OTHER): Payer: Self-pay | Admitting: Primary Care

## 2021-11-10 ENCOUNTER — Other Ambulatory Visit: Payer: Self-pay

## 2021-11-10 DIAGNOSIS — M1000 Idiopathic gout, unspecified site: Secondary | ICD-10-CM

## 2021-11-10 MED ORDER — ALLOPURINOL 100 MG PO TABS
100.0000 mg | ORAL_TABLET | Freq: Every day | ORAL | 6 refills | Status: DC
  Filled 2021-11-10: qty 30, 30d supply, fill #0
  Filled 2021-12-09: qty 30, 30d supply, fill #1
  Filled 2022-01-27: qty 30, 30d supply, fill #0
  Filled 2022-01-27: qty 30, 30d supply, fill #1

## 2021-11-15 ENCOUNTER — Other Ambulatory Visit (INDEPENDENT_AMBULATORY_CARE_PROVIDER_SITE_OTHER): Payer: Self-pay | Admitting: Primary Care

## 2021-11-15 ENCOUNTER — Ambulatory Visit (INDEPENDENT_AMBULATORY_CARE_PROVIDER_SITE_OTHER): Payer: Self-pay | Admitting: *Deleted

## 2021-11-15 ENCOUNTER — Other Ambulatory Visit: Payer: Self-pay

## 2021-11-15 ENCOUNTER — Ambulatory Visit: Payer: Self-pay | Admitting: Orthopedic Surgery

## 2021-11-15 DIAGNOSIS — M10079 Idiopathic gout, unspecified ankle and foot: Secondary | ICD-10-CM

## 2021-11-15 DIAGNOSIS — M1000 Idiopathic gout, unspecified site: Secondary | ICD-10-CM

## 2021-11-15 NOTE — Telephone Encounter (Deleted)
I returned pt's call.   She had called in earlier today c/o gout pain in her foot and ankle that started 2 days ago and needing refills on her gout medications.  The allopurinol 100 mg is ready at [facility] so she is going to pick it up now.  The colchicine 0.6 mg I'm not sure what the status of that is.   I'm sending a note to [facility] for refill and [facility] [address] In [place].   Reason for Disposition  [1] Swollen foot AND [2] no fever  (Exceptions: localized bump from bunions, calluses, insect bite, sting)  Answer Assessment - Initial Assessment Questions 1. ONSET: "When did the pain start?"      *** 2. LOCATION: "Where is the pain located?"      *** 3. PAIN: "How bad is the pain?"    (Scale 1-10; or mild, moderate, severe)  - MILD (1-3): doesn't interfere with normal activities.   - MODERATE (4-7): interferes with normal activities (e.g., work or school) or awakens from sleep, limping.   - SEVERE (8-10): excruciating pain, unable to do any normal activities, unable to walk.      *** 4. WORK OR EXERCISE: "Has there been any recent work or exercise that involved this part of the body?"      *** 5. CAUSE: "What do you think is causing the foot pain?"     *** 6. OTHER SYMPTOMS: "Do you have any other symptoms?" (e.g., leg pain, rash, fever, numbness)     *** 7. PREGNANCY: "Is there any chance you are pregnant?" "When was your last menstrual period?"     ***    Pt needing her gout medications refilled and sent to pharmacy.  Protocols used: Foot Pain-A-AH

## 2021-11-15 NOTE — Telephone Encounter (Signed)
Sent to PCP ?

## 2021-11-15 NOTE — Telephone Encounter (Deleted)
I returned pt's call.   She had called in earlier today c/o having a gout flare up in her ankle and foot for the last 2 days.  She is requesting her gout medication be sent to pharmacy.  It looks like the allopurinol 100 mg was sent to [facility] on 11/10/2021 for #30 with 6 refills.   Pt is going to pick that up now.  The Colchicine 0.6 mg she is requesting that [facility] [address] [facility] [place].  I'm not sure but it looks like [facility] on [facility]15/2021.    I'm sending pt's request for colchicine 0.6 mg to [facility] for a refill at pt's request since unsure of its status.

## 2021-11-15 NOTE — Telephone Encounter (Deleted)
Answer Assessment - Initial Assessment Questions 1. ONSET: "When did the pain start?"      I returned her call.  Gout pain.   2. LOCATION: "Where is the pain located?"      Right foot and ankle.    2 days ago.    Pt has medication ordered.  Allopurinol 100 mg is at [facility]. [facility] [address] 3. PAIN: "How bad is the pain?"    (Scale 1-10; or mild, moderate, severe)  - MILD (1-3): doesn't interfere with normal activities.   - MODERATE (4-7): interferes with normal activities (e.g., work or school) or awakens from sleep, limping.   - SEVERE (8-10): excruciating pain, unable to do any normal activities, unable to walk.      *No Answer* 4. WORK OR EXERCISE: "Has there been any recent work or exercise that involved this part of the body?"      *No Answer* 5. CAUSE: "What do you think is causing the foot pain?"     *No Answer* 6. OTHER SYMPTOMS: "Do you have any other symptoms?" (e.g., leg pain, rash, fever, numbness)     *No Answer* 7. PREGNANCY: "Is there any chance you are pregnant?" "When was your last menstrual period?"     *No Answer*  Protocols used: Foot Pain-A-AH

## 2021-11-15 NOTE — Telephone Encounter (Signed)
I returned pt's call.   She had called in earlier today c/o gout pain in her foot and ankle that started 2 days ago and needing refills on her gout medications.   The allopurinol 100 mg is ready at [facility] so she is going to pick it up now.   The colchicine 0.6 mg I'm not sure what the status of that is.   I'm sending a note to [place] for refill and [facility] [address] In [place].

## 2021-11-15 NOTE — Telephone Encounter (Signed)
Pt calling to check status. Pt does not understand why this can not be filled. Please advise.

## 2021-11-22 ENCOUNTER — Other Ambulatory Visit (INDEPENDENT_AMBULATORY_CARE_PROVIDER_SITE_OTHER): Payer: Self-pay | Admitting: Primary Care

## 2021-11-22 ENCOUNTER — Other Ambulatory Visit: Payer: Self-pay

## 2021-11-22 DIAGNOSIS — I10 Essential (primary) hypertension: Secondary | ICD-10-CM

## 2021-11-22 MED ORDER — HYDROCHLOROTHIAZIDE 25 MG PO TABS
25.0000 mg | ORAL_TABLET | Freq: Every day | ORAL | 3 refills | Status: DC
  Filled 2021-11-22 – 2022-01-28 (×4): qty 30, 30d supply, fill #0
  Filled 2022-03-16 – 2022-03-29 (×2): qty 30, 30d supply, fill #1

## 2021-11-22 MED ORDER — AMLODIPINE BESYLATE 10 MG PO TABS
10.0000 mg | ORAL_TABLET | Freq: Every day | ORAL | 1 refills | Status: DC
  Filled 2021-11-22 – 2022-01-28 (×4): qty 30, 30d supply, fill #0
  Filled 2022-03-16 – 2022-03-29 (×2): qty 30, 30d supply, fill #1

## 2021-11-23 ENCOUNTER — Other Ambulatory Visit: Payer: Self-pay

## 2021-11-24 ENCOUNTER — Other Ambulatory Visit: Payer: Self-pay

## 2021-12-09 ENCOUNTER — Other Ambulatory Visit: Payer: Self-pay

## 2021-12-16 ENCOUNTER — Other Ambulatory Visit: Payer: Self-pay

## 2021-12-23 ENCOUNTER — Telehealth (INDEPENDENT_AMBULATORY_CARE_PROVIDER_SITE_OTHER): Payer: Self-pay

## 2021-12-23 NOTE — Telephone Encounter (Signed)
Patient called in to inform [facility] that the medication she need is   colchicine 0.6 MG tablet and that she would like it sent today and want a call back when done . Please advise

## 2021-12-23 NOTE — Telephone Encounter (Signed)
Copied from [place] [phone] [id number]. Topic: General - Other >> Dec 23, 2021  9:50 AM Yvette Rack wrote: Reason for CRM: Pt requests Rx for gout [id number] [place], [place]  Phone: [phone]  Fax: [phone]

## 2021-12-23 NOTE — Telephone Encounter (Signed)
Request routed to PCP ?

## 2021-12-24 NOTE — Telephone Encounter (Signed)
Patient would like to be contacted directly by PCP. States that PCP needs to work with her because she is in pain. She can not wait until first available appointment.

## 2021-12-24 NOTE — Telephone Encounter (Signed)
Colchicine is use for gout flare up ,she is on maintenance medication for gout . If medication is not working she needs appt and labs with medication change after the results are reviewed

## 2021-12-27 ENCOUNTER — Other Ambulatory Visit: Payer: Self-pay

## 2021-12-27 NOTE — Telephone Encounter (Signed)
Routed to PCP for 2nd time

## 2021-12-27 NOTE — Telephone Encounter (Signed)
Pt called and stated that she would like a call back from PCP regarding last encounter. Pt states that she is in a lot of pain. Please advise

## 2021-12-28 NOTE — Telephone Encounter (Signed)
Called patient at 5:49 pm to explain can not prescribe new medication without being seen. Reviewed hx of medication and did not see colchicine prescribed . NO ANSWER NO ANSWERING MACHINE

## 2021-12-30 ENCOUNTER — Other Ambulatory Visit: Payer: Self-pay

## 2022-01-27 ENCOUNTER — Other Ambulatory Visit (INDEPENDENT_AMBULATORY_CARE_PROVIDER_SITE_OTHER): Payer: Self-pay | Admitting: Primary Care

## 2022-01-28 ENCOUNTER — Other Ambulatory Visit: Payer: Self-pay

## 2022-01-31 ENCOUNTER — Other Ambulatory Visit: Payer: Self-pay

## 2022-02-02 ENCOUNTER — Telehealth (INDEPENDENT_AMBULATORY_CARE_PROVIDER_SITE_OTHER): Payer: Self-pay | Admitting: *Deleted

## 2022-02-02 ENCOUNTER — Other Ambulatory Visit: Payer: Self-pay

## 2022-02-02 ENCOUNTER — Ambulatory Visit (INDEPENDENT_AMBULATORY_CARE_PROVIDER_SITE_OTHER): Payer: Self-pay | Admitting: Primary Care

## 2022-02-02 ENCOUNTER — Encounter (INDEPENDENT_AMBULATORY_CARE_PROVIDER_SITE_OTHER): Payer: Self-pay

## 2022-02-02 NOTE — Telephone Encounter (Signed)
Copied from [place] [id number]. Topic: General - Call Back - No Documentation >> Jan 31, 2022 12:09 PM Erick Blinks wrote: Reason for CRM: Pt wants to speak to a member at the clinic. Has concerns  Best contact: [phone]

## 2022-02-02 NOTE — Telephone Encounter (Signed)
Patient states she does not know what I am referring to. She was seen in the office today.

## 2022-02-03 ENCOUNTER — Ambulatory Visit (INDEPENDENT_AMBULATORY_CARE_PROVIDER_SITE_OTHER): Payer: Self-pay | Admitting: *Deleted

## 2022-02-03 ENCOUNTER — Telehealth (INDEPENDENT_AMBULATORY_CARE_PROVIDER_SITE_OTHER): Payer: Self-pay | Admitting: Primary Care

## 2022-02-03 NOTE — Telephone Encounter (Signed)
Copied from [place] [id number]. Topic: Quick Communication - Rx Refill/Question >> Feb 03, 2022 10:09 AM Leward Quan A wrote: Medication: colchicine 0.6 MG tablet  Patient having a gout flare up, nurse please call patient   Has the patient contacted their pharmacy? No. Have not had it in a while  (Agent: If no, request that the patient contact the pharmacy for the refill. If patient does not wish to contact the pharmacy document the reason why and proceed with request.) (Agent: If yes, when and what did the pharmacy advise?)  Preferred Pharmacy (with phone number or street name): [id number] [place] [place]  Phone:  [phone] Fax:  [phone]    Has the patient been seen for an appointment in the last year OR does the patient have an upcoming appointment? Yes.    Agent: Please be advised that RX refills may take up to 3 business days. We ask that you follow-up with your pharmacy.

## 2022-02-03 NOTE — Telephone Encounter (Signed)
Patient triaged in NT encounter for gout symptoms, routing refill request to provider.

## 2022-02-03 NOTE — Telephone Encounter (Signed)
Call disconnected when transferred from agent. Called patient to review symptoms of gout .   Chief Complaint: left great toe pain requesting medication Symptoms: left foot pain now in left great toe. Difficulty walking  Frequency: did not state how long Pertinent Negatives: Patient denies fever, redness. Disposition: [] ED /[x] Urgent Care (no appt availability in office) / [] Appointment(In office/virtual)/ [x]  [facility]/ [] Home Care/ [x] Refused Recommended Disposition /[] [place] [facility]/ []  Follow-up with PCP Additional Notes:   Requesting medication and reports she did not receive a call from PCP back in January. Patient reports PCP prescribed her colchine. Required extra time to review with patient medication is not on current list and she will need appt. Recommended [facility] walk in clinic or My Chart VV. Please advise .    Reason for Disposition  [1] MODERATE pain (e.g., interferes with normal activities, limping) AND [2] present > 3 days  Pain in the big toe joint  Answer Assessment - Initial Assessment Questions 1. ONSET: "When did the pain start?"      Did not answer how long started  2. LOCATION: "Where is the pain located?"      Left great toes  3. PAIN: "How bad is the pain?"    (Scale 1-10; or mild, moderate, severe)  - MILD (1-3): doesn't interfere with normal activities.   - MODERATE (4-7): interferes with normal activities (e.g., work or school) or awakens from sleep, limping.   - SEVERE (8-10): excruciating pain, unable to do any normal activities, unable to walk.      Difficulty walking  4. WORK OR EXERCISE: "Has there been any recent work or exercise that involved this part of the body?"      na 5. CAUSE: "What do you think is causing the foot pain?"     Gout left great toe 6. OTHER SYMPTOMS: "Do you have any other symptoms?" (e.g., leg pain, rash, fever, numbness)     Did not report other sx 7. PREGNANCY: "Is there any chance you are pregnant?" "When  was your last menstrual period?"     na  Protocols used: Foot Pain-A-AH

## 2022-02-03 NOTE — Telephone Encounter (Signed)
Patient DOES NOT want to see PCP any more. She is scheduled for new appointment on 2/21. Advised to discuss with that provider or go to urgent care if flare up is severe and she can not wait.

## 2022-02-03 NOTE — Telephone Encounter (Signed)
Please advise. Patient requesting medication.

## 2022-02-08 ENCOUNTER — Other Ambulatory Visit: Payer: Self-pay

## 2022-02-08 ENCOUNTER — Encounter: Payer: Self-pay | Admitting: Family Medicine

## 2022-02-08 ENCOUNTER — Ambulatory Visit (INDEPENDENT_AMBULATORY_CARE_PROVIDER_SITE_OTHER): Payer: Self-pay | Admitting: Family Medicine

## 2022-02-08 VITALS — BP 136/90 | HR 67 | Temp 98.2°F | Resp 16 | Ht 65.0 in | Wt 333.2 lb

## 2022-02-08 DIAGNOSIS — Z6841 Body Mass Index (BMI) 40.0 and over, adult: Secondary | ICD-10-CM

## 2022-02-08 DIAGNOSIS — I10 Essential (primary) hypertension: Secondary | ICD-10-CM

## 2022-02-08 DIAGNOSIS — M1000 Idiopathic gout, unspecified site: Secondary | ICD-10-CM

## 2022-02-08 MED ORDER — COLCHICINE 0.6 MG PO TABS
0.6000 mg | ORAL_TABLET | Freq: Every day | ORAL | 0 refills | Status: DC
  Filled 2022-02-08: qty 30, 30d supply, fill #0

## 2022-02-08 MED ORDER — COLCHICINE 0.6 MG PO TABS: 0.6000 mg | ORAL_TABLET | Freq: Every day | ORAL | 0 refills | Status: DC

## 2022-02-09 ENCOUNTER — Encounter: Payer: Self-pay | Admitting: Family Medicine

## 2022-02-09 NOTE — Progress Notes (Signed)
Established Patient Office Visit  Subjective:  Patient ID: Vickie Patel, female    DOB: August 03, 1971  Age: 51 y.o. MRN: [id number]  CC:  Chief Complaint  Patient presents with   Establish Care   Gout    HPI GRACIEANN STANNARD presents for follow up of hypertension. Patient also reports that she is having a flare of gout. She has been eating fish and had high purine foods.   Past Medical History:  Diagnosis Date   Anemia    Blood transfusion without reported diagnosis    Constipation, chronic    Enlarged heart    Hypertension    Uterine fibroid     Past Surgical History:  Procedure Laterality Date   TUBAL LIGATION      Family History  Problem Relation Age of Onset   Hypertension Mother    Heart failure Mother    Alzheimer's disease Mother     Social History   Socioeconomic History   Marital status: Legally Separated    Spouse name: Not on file   Number of children: Not on file   Years of education: Not on file   Highest education level: Not on file  Occupational History   Not on file  Tobacco Use   Smoking status: Some Days    Packs/day: 0.25    Types: Cigarettes    Start date: 10/07/2015   Smokeless tobacco: Current   Tobacco comments:    0.5 packs per week  Vaping Use   Vaping Use: Never used  Substance and Sexual Activity   Alcohol use: Yes    Alcohol/week: 0.0 standard drinks    Comment: 1 pint per week   Drug use: No   Sexual activity: Yes    Birth control/protection: Surgical  Other Topics Concern   Not on file  Social History Narrative   Not on file   Social Determinants of Health   Financial Resource Strain: Not on file  Food Insecurity: Not on file  Transportation Needs: Not on file  Physical Activity: Not on file  Stress: Not on file  Social Connections: Not on file  Intimate Partner Violence: Not on file    ROS Review of Systems  Musculoskeletal:  Positive for arthralgias.  All other systems reviewed and are  negative.  Objective:   Today's Vitals: BP 136/90    Pulse 67    Temp 98.2 F (36.8 C) (Oral)    Resp 16    Ht 5\' 5"  (1.651 m)    Wt (!) 333 lb 3.2 oz (151.1 kg)    SpO2 95%    BMI 55.45 kg/m   Physical Exam Vitals and nursing note reviewed.  Constitutional:      General: She is not in acute distress.    Appearance: She is obese.  Cardiovascular:     Rate and Rhythm: Normal rate and regular rhythm.  Pulmonary:     Effort: Pulmonary effort is normal.     Breath sounds: Normal breath sounds.  Abdominal:     Palpations: Abdomen is soft.     Tenderness: There is no abdominal tenderness.  Musculoskeletal:        General: Tenderness (joint) present.  Neurological:     General: No focal deficit present.     Mental Status: She is alert and oriented to person, place, and time.    Assessment & Plan:   1. Essential hypertension Appears stable with present management. Continue and monitor  2. Idiopathic gout, unspecified chronicity,  unspecified site Colchicine prescribed. Discussed dietary options.  3. Class 3 severe obesity due to excess calories with serious comorbidity and body mass index (BMI) of 50.0 to 59.9 in adult [facility]) Discussed dietary and activity options. Goal is 3-5lbs/mo weight loss. Monitor.     Outpatient Encounter Medications as of 02/08/2022  Medication Sig   acetaminophen (TYLENOL) 500 MG tablet Take 1 tablet (500 mg total) by mouth every 6 (six) hours as needed.   allopurinol (ZYLOPRIM) 100 MG tablet Take 1 tablet (100 mg total) by mouth daily.   amLODipine (NORVASC) 10 MG tablet Take 1 tablet (10 mg total) by mouth daily.   aspirin EC 81 MG tablet Take 81 mg by mouth daily.   hydrochlorothiazide (HYDRODIURIL) 25 MG tablet Take 1 tablet (25 mg total) by mouth daily.   ibuprofen (ADVIL) 400 MG tablet Take 1 tablet (400 mg total) by mouth every 8 (eight) hours as needed.   [DISCONTINUED] colchicine 0.6 MG tablet Take 1 tablet (0.6 mg total) by mouth daily.    colchicine 0.6 MG tablet Take 1 tablet (0.6 mg total) by mouth daily.   No facility-administered encounter medications on file as of 02/08/2022.    Follow-up: No follow-ups on file.   Becky Sax, MD

## 2022-03-08 ENCOUNTER — Ambulatory Visit: Payer: Self-pay | Admitting: Family Medicine

## 2022-03-14 ENCOUNTER — Other Ambulatory Visit: Payer: Self-pay | Admitting: Family Medicine

## 2022-03-14 DIAGNOSIS — I10 Essential (primary) hypertension: Secondary | ICD-10-CM

## 2022-03-14 DIAGNOSIS — M1000 Idiopathic gout, unspecified site: Secondary | ICD-10-CM

## 2022-03-14 NOTE — Telephone Encounter (Signed)
Medication Refill - Medication:  ?hydrochlorothiazide (HYDRODIURIL) 25 MG tablet  ?allopurinol (ZYLOPRIM) 100 MG tablet  ?amLODipine (NORVASC) 10 MG tablet  ? ?Has the patient contacted their pharmacy? Yes.   ?Contact PCP ? ?Preferred Pharmacy (with phone number or street name):  ?[facility] [place] [address], [place], [place] [address]  ?Phone:  [phone]  Fax:  [phone]  ? ?Has the patient been seen for an appointment in the last year OR does the patient have an upcoming appointment? Yes.   ? ?Agent: Please be advised that RX refills may take up to 3 business days. We ask that you follow-up with your pharmacy. ?

## 2022-03-15 NOTE — Telephone Encounter (Signed)
Requested medication (s) are due for refill today: Should have refills available on all meds. Attempted to reach pharmacy x 2 to verify ? ?Requested medication (s) are on the active medication list: yes ? ?Last refill: HCTZ  11/22/21 #90  3 refills    Allopurinol  11/10/21  #30  6 refills   Norvasc  11/22/21 #90  1 refill ? ?Future visit scheduled yes  04/11/22 ? ?Notes to clinic:Historical Provider, all meds. Please review. Thank you ? ?Requested Prescriptions  ?Pending Prescriptions Disp Refills  ? hydrochlorothiazide (HYDRODIURIL) 25 MG tablet 90 tablet 3  ?  Sig: Take 1 tablet (25 mg total) by mouth daily.  ?  ? Cardiovascular: Diuretics - Thiazide Failed - 03/14/2022 10:42 PM  ?  ?  Failed - Cr in normal range and within 180 days  ?  Creatinine, Ser  ?Date Value Ref Range Status  ?02/20/2021 1.21 (H) 0.44 - 1.00 mg/dL Final  ?  ?  ?  ?  Failed - K in normal range and within 180 days  ?  Potassium  ?Date Value Ref Range Status  ?02/20/2021 3.9 3.5 - 5.1 mmol/L Final  ?  ?  ?  ?  Failed - Na in normal range and within 180 days  ?  Sodium  ?Date Value Ref Range Status  ?02/20/2021 142 135 - 145 mmol/L Final  ?  ?  ?  ?  Failed - Last BP in normal range  ?  BP Readings from Last 1 Encounters:  ?02/08/22 136/90  ?  ?  ?  ?  Passed - Valid encounter within last 6 months  ?  Recent Outpatient Visits   ? ?      ? 1 month ago Essential hypertension  ? Primary Care at [facility], MD  ? 4 months ago Colon cancer screening  ? [facility] Kerin Perna, NP  ? 5 months ago Moderate episode of recurrent major depressive disorder ([place])  ? [facility] Kerin Perna, NP  ? 6 months ago Morbid obesity ([place])  ? [facility] Kerin Perna, NP  ? 1 year ago Encounter to establish care  ? [facility] Kerin Perna, NP  ? ?  ?  ?Future Appointments   ? ?        ? In 3 weeks Dorna Mai, MD Primary Care  at [facility]  ? In 1 month Dorna Mai, MD Primary Care at [facility]  ? ?  ? ?  ?  ?  ? allopurinol (ZYLOPRIM) 100 MG tablet 30 tablet 6  ?  Sig: Take 1 tablet (100 mg total) by mouth daily.  ?  ? Endocrinology:  Gout Agents - allopurinol Failed - 03/14/2022 10:42 PM  ?  ?  Failed - Uric Acid in normal range and within 360 days  ?  No results found for: POCURA, LABURIC  ?  ?  ?  Failed - Cr in normal range and within 360 days  ?  Creatinine, Ser  ?Date Value Ref Range Status  ?02/20/2021 1.21 (H) 0.44 - 1.00 mg/dL Final  ?  ?  ?  ?  Failed - CBC within normal limits and completed in the last 12 months  ?  WBC  ?Date Value Ref Range Status  ?02/20/2021 5.0 4.0 - 10.5 K/uL Final  ? ?RBC  ?Date Value Ref Range Status  ?02/20/2021 5.02  3.87 - 5.11 MIL/uL Final  ? ?Hemoglobin  ?Date Value Ref Range Status  ?02/20/2021 13.0 12.0 - 15.0 g/dL Final  ? ?HCT  ?Date Value Ref Range Status  ?02/20/2021 42.9 36.0 - 46.0 % Final  ? ?MCHC  ?Date Value Ref Range Status  ?02/20/2021 30.3 30.0 - 36.0 g/dL Final  ? ?MCH  ?Date Value Ref Range Status  ?02/20/2021 25.9 (L) 26.0 - 34.0 pg Final  ? ?MCV  ?Date Value Ref Range Status  ?02/20/2021 85.5 80.0 - 100.0 fL Final  ? ?No results found for: PLTCOUNTKUC, LABPLAT, [place] ?RDW  ?Date Value Ref Range Status  ?02/20/2021 15.2 11.5 - 15.5 % Final  ? ?  ?  ?  Passed - Valid encounter within last 12 months  ?  Recent Outpatient Visits   ? ?      ? 1 month ago Essential hypertension  ? Primary Care at [facility], MD  ? 4 months ago Colon cancer screening  ? [facility] Kerin Perna, NP  ? 5 months ago Moderate episode of recurrent major depressive disorder ([place])  ? [facility] Ker[facility]erna, NP  ? 6 months ago Morbid obesity ([place])  ? [facility] Kerin Perna, NP  ? 1 year ago Encounter to establish care  ? [facility] Kerin Perna, NP  ? ?  ?   ?Future Appointments   ? ?        ? In 3 weeks Dorna Mai, MD Primary Care at [facility]  ? In 1 month Dorna Mai, MD Primary Care at [facility]  ? ?  ? ?  ?  ?  ? amLODipine (NORVASC) 10 MG tablet 90 tablet 1  ?  Sig: Take 1 tablet (10 mg total) by mouth daily.  ?  ? Cardiovascular: Calcium Channel Blockers 2 Failed - 03/14/2022 10:42 PM  ?  ?  Failed - Last BP in normal range  ?  BP Readings from Last 1 Encounters:  ?02/08/22 136/90  ?  ?  ?  ?  Passed - Last Heart Rate in normal range  ?  Pulse Readings from Last 1 Encounters:  ?02/08/22 67  ?  ?  ?  ?  Passed - Valid encounter within last 6 months  ?  Recent Outpatient Visits   ? ?      ? 1 month ago Essential hypertension  ? Primary Care at [facility], MD  ? 4 months ago Colon cancer screening  ? [facility] Ker[facility] Perna, NP  ? 5 months ago Moderate episode of recurrent major depressive disorder ([place])  ? [facility] Kerin Perna, NP  ? 6 months ago Morbid obesity ([place])  ? [facility] Ker[facility] Perna, NP  ? 1 year ago Encounter to establish care  ? [facility] Ker[facility] Perna, NP  ? ?  ?  ?Future Appointments   ? ?        ? In 3 weeks Dorna Mai, MD Primary Care at [facility]  ? In 1 month Dorna Mai, MD Primary Care at [facility]  ? ?  ? ?  ?  ?  ? ? ? ? ?

## 2022-03-16 ENCOUNTER — Other Ambulatory Visit: Payer: Self-pay

## 2022-03-16 ENCOUNTER — Other Ambulatory Visit: Payer: Self-pay | Admitting: Family Medicine

## 2022-03-17 ENCOUNTER — Other Ambulatory Visit: Payer: Self-pay | Admitting: Family Medicine

## 2022-03-18 ENCOUNTER — Other Ambulatory Visit: Payer: Self-pay

## 2022-03-18 MED ORDER — COLCHICINE 0.6 MG PO TABS: 0.6000 mg | ORAL_TABLET | Freq: Every day | ORAL | 0 refills | Status: DC

## 2022-03-23 ENCOUNTER — Other Ambulatory Visit: Payer: Self-pay

## 2022-03-29 ENCOUNTER — Other Ambulatory Visit: Payer: Self-pay

## 2022-04-05 ENCOUNTER — Other Ambulatory Visit: Payer: Self-pay

## 2022-04-11 ENCOUNTER — Ambulatory Visit: Payer: Self-pay | Admitting: Family Medicine

## 2022-04-12 ENCOUNTER — Other Ambulatory Visit: Payer: Self-pay | Admitting: *Deleted

## 2022-04-12 ENCOUNTER — Other Ambulatory Visit: Payer: Self-pay

## 2022-04-12 DIAGNOSIS — M1000 Idiopathic gout, unspecified site: Secondary | ICD-10-CM

## 2022-04-12 DIAGNOSIS — I10 Essential (primary) hypertension: Secondary | ICD-10-CM

## 2022-04-12 MED ORDER — AMLODIPINE BESYLATE 10 MG PO TABS
10.0000 mg | ORAL_TABLET | Freq: Every day | ORAL | 1 refills | Status: DC
  Filled 2022-04-12: qty 30, 30d supply, fill #0

## 2022-04-12 MED ORDER — ALLOPURINOL 100 MG PO TABS
100.0000 mg | ORAL_TABLET | Freq: Every day | ORAL | 3 refills | Status: DC
  Filled 2022-04-12: qty 30, 30d supply, fill #0
  Filled 2022-05-11: qty 30, 30d supply, fill #1

## 2022-04-12 MED ORDER — HYDROCHLOROTHIAZIDE 25 MG PO TABS
25.0000 mg | ORAL_TABLET | Freq: Every day | ORAL | 1 refills | Status: DC
  Filled 2022-04-12: qty 90, 90d supply, fill #0
  Filled 2022-05-02: qty 30, 30d supply, fill #0

## 2022-04-12 NOTE — Telephone Encounter (Signed)
Medication has been refilled.

## 2022-04-13 ENCOUNTER — Other Ambulatory Visit: Payer: Self-pay

## 2022-04-15 ENCOUNTER — Ambulatory Visit: Payer: Self-pay | Admitting: Family Medicine

## 2022-04-28 ENCOUNTER — Other Ambulatory Visit: Payer: Self-pay | Admitting: Family Medicine

## 2022-05-02 ENCOUNTER — Other Ambulatory Visit: Payer: Self-pay

## 2022-05-09 ENCOUNTER — Ambulatory Visit: Payer: Self-pay | Admitting: Family

## 2022-05-10 ENCOUNTER — Encounter: Payer: Self-pay | Admitting: Family Medicine

## 2022-05-10 ENCOUNTER — Ambulatory Visit (INDEPENDENT_AMBULATORY_CARE_PROVIDER_SITE_OTHER): Payer: Commercial Managed Care - HMO | Admitting: Family Medicine

## 2022-05-10 DIAGNOSIS — Z6841 Body Mass Index (BMI) 40.0 and over, adult: Secondary | ICD-10-CM

## 2022-05-10 DIAGNOSIS — G8929 Other chronic pain: Secondary | ICD-10-CM

## 2022-05-10 DIAGNOSIS — M25562 Pain in left knee: Secondary | ICD-10-CM

## 2022-05-10 DIAGNOSIS — M25552 Pain in left hip: Secondary | ICD-10-CM

## 2022-05-10 DIAGNOSIS — M25561 Pain in right knee: Secondary | ICD-10-CM

## 2022-05-10 DIAGNOSIS — I10 Essential (primary) hypertension: Secondary | ICD-10-CM | POA: Diagnosis not present

## 2022-05-10 DIAGNOSIS — M25551 Pain in right hip: Secondary | ICD-10-CM

## 2022-05-10 MED ORDER — HYDROCHLOROTHIAZIDE 25 MG PO TABS: 25.0000 mg | ORAL_TABLET | Freq: Every day | ORAL | 1 refills | Status: DC

## 2022-05-10 MED ORDER — TRAMADOL HCL 50 MG PO TABS: 50.0000 mg | ORAL_TABLET | Freq: Two times a day (BID) | ORAL | 0 refills | Status: DC | PRN

## 2022-05-10 MED ORDER — AMLODIPINE BESYLATE 10 MG PO TABS: 10.0000 mg | ORAL_TABLET | Freq: Every day | ORAL | 1 refills | Status: DC

## 2022-05-11 ENCOUNTER — Other Ambulatory Visit: Payer: Self-pay | Admitting: Family Medicine

## 2022-05-11 ENCOUNTER — Other Ambulatory Visit: Payer: Self-pay

## 2022-05-11 NOTE — Progress Notes (Signed)
Established Patient Office Visit  Subjective    Patient ID: Vickie Patel, female    DOB: 02-May-1971  Age: 51 y.o. MRN: [id number]  CC:  Chief Complaint  Patient presents with   Establish Care    HPI Vickie Patel presents for routine follow up of hypertension. Patient also reports bilateral hip and knee pain persists. She reports that she is considering a knee replacement.    Outpatient Encounter Medications as of 05/10/2022  Medication Sig   acetaminophen (TYLENOL) 500 MG tablet Take 1 tablet (500 mg total) by mouth every 6 (six) hours as needed.   allopurinol (ZYLOPRIM) 100 MG tablet Take 1 tablet (100 mg total) by mouth daily.   aspirin EC 81 MG tablet Take 81 mg by mouth daily.   colchicine 0.6 MG tablet Take 1 tablet (0.6 mg total) by mouth daily.   Diclofenac Sodium CR 100 MG 24 hr tablet Take 100 mg by mouth daily.   ibuprofen (ADVIL) 400 MG tablet Take 1 tablet (400 mg total) by mouth every 8 (eight) hours as needed.   traMADol (ULTRAM) 50 MG tablet Take 1 tablet (50 mg total) by mouth 2 (two) times daily as needed.   [DISCONTINUED] amLODipine (NORVASC) 10 MG tablet Take 1 tablet (10 mg total) by mouth daily.   [DISCONTINUED] hydrochlorothiazide (HYDRODIURIL) 25 MG tablet Take 1 tablet (25 mg total) by mouth daily.   amLODipine (NORVASC) 10 MG tablet Take 1 tablet (10 mg total) by mouth daily.   hydrochlorothiazide (HYDRODIURIL) 25 MG tablet Take 1 tablet (25 mg total) by mouth daily.   No facility-administered encounter medications on file as of 05/10/2022.    Past Medical History:  Diagnosis Date   Anemia    Blood transfusion without reported diagnosis    Constipation, chronic    Enlarged heart    Hypertension    Uterine fibroid     Past Surgical History:  Procedure Laterality Date   TUBAL LIGATION      Family History  Problem Relation Age of Onset   Hypertension Mother    Heart failure Mother    Alzheimer's disease Mother     Social History    Socioeconomic History   Marital status: Legally Separated    Spouse name: Not on file   Number of children: Not on file   Years of education: Not on file   Highest education level: Not on file  Occupational History   Not on file  Tobacco Use   Smoking status: Some Days    Packs/day: 0.25    Types: Cigarettes    Start date: 10/07/2015   Smokeless tobacco: Current   Tobacco comments:    0.5 packs per week  Vaping Use   Vaping Use: Never used  Substance and Sexual Activity   Alcohol use: Yes    Alcohol/week: 0.0 standard drinks    Comment: 1 pint per week   Drug use: No   Sexual activity: Yes    Birth control/protection: Surgical  Other Topics Concern   Not on file  Social History Narrative   Not on file   Social Determinants of Health   Financial Resource Strain: Not on file  Food Insecurity: Not on file  Transportation Needs: Not on file  Physical Activity: Not on file  Stress: Not on file  Social Connections: Not on file  Intimate Partner Violence: Not on file    Review of Systems  All other systems reviewed and are negative.  Objective    There were no vitals taken for this visit.  Physical Exam Vitals and nursing note reviewed.  Constitutional:      General: She is not in acute distress.    Appearance: She is obese.  Cardiovascular:     Rate and Rhythm: Normal rate and regular rhythm.  Pulmonary:     Effort: Pulmonary effort is normal.     Breath sounds: Normal breath sounds.  Abdominal:     Palpations: Abdomen is soft.     Tenderness: There is no abdominal tenderness.  Musculoskeletal:     Right hip: Tenderness present. No deformity. Decreased range of motion.     Left hip: Tenderness present. No deformity. Decreased range of motion.     Right knee: No deformity. Decreased range of motion. Tenderness present.     Left knee: No deformity. Decreased range of motion. Tenderness present.  Neurological:     General: No focal deficit present.      Mental Status: She is alert and oriented to person, place, and time.        Assessment & Plan:   1. Essential hypertension Appears stable with present management. Continue and monitor - hydrochlorothiazide (HYDRODIURIL) 25 MG tablet; Take 1 tablet (25 mg total) by mouth daily.  Dispense: 90 tablet; Refill: 1 - amLODipine (NORVASC) 10 MG tablet; Take 1 tablet (10 mg total) by mouth daily.  Dispense: 90 tablet; Refill: 1  2. Class 3 severe obesity due to excess calories with serious comorbidity and body mass index (BMI) of 50.0 to 59.9 in adult [facility]) Discussed dietary and activity options.   3. Hip pain, bilateral Tramadol prescribed.   4. Chronic pain of both knees As above  Return in about 6 months (around 11/10/2022) for follow up.   Becky Sax, MD

## 2022-06-06 ENCOUNTER — Other Ambulatory Visit: Payer: Self-pay | Admitting: Family Medicine

## 2022-06-06 ENCOUNTER — Other Ambulatory Visit: Payer: Self-pay

## 2022-06-06 DIAGNOSIS — I10 Essential (primary) hypertension: Secondary | ICD-10-CM

## 2022-06-06 NOTE — Telephone Encounter (Signed)
Pt called and is requesting to have her Rx sent to this pharmacy instead, please advise  [place], [place] - [address]  [place] [address]  Phone: [phone] Fax: [phone]

## 2022-06-07 ENCOUNTER — Other Ambulatory Visit: Payer: Self-pay

## 2022-06-10 ENCOUNTER — Telehealth: Payer: Self-pay | Admitting: Family Medicine

## 2022-06-10 ENCOUNTER — Ambulatory Visit: Payer: Self-pay

## 2022-06-13 NOTE — Telephone Encounter (Signed)
Attempted to reach pt., extended ring, unable to leave VM to call back.

## 2022-06-19 ENCOUNTER — Other Ambulatory Visit: Payer: Self-pay | Admitting: Family Medicine

## 2022-06-20 NOTE — Telephone Encounter (Signed)
Requested medications are due for refill today.  yes  Requested medications are on the active medications list.  yes  Last refill. 05/12/2022  Future visit scheduled.   yes  Notes to clinic.  Labs are expired.    Requested Prescriptions  Pending Prescriptions Disp Refills   colchicine 0.6 MG tablet [Pharmacy Med Name: COLCHICINE 0.6 MG TAB[!]] 30 tablet 0    Sig: TAKE ONE TABLET BY MOUTH ONE TIME DAILY     Endocrinology:  Gout Agents - colchicine Failed - 06/19/2022 10:09 AM      Failed - Cr in normal range and within 360 days    Creatinine, Ser  Date Value Ref Range Status  02/20/2021 1.21 (H) 0.44 - 1.00 mg/dL Final         Failed - ALT in normal range and within 360 days    ALT  Date Value Ref Range Status  07/09/2014 8 0 - 35 U/L Final         Failed - AST in normal range and within 360 days    AST  Date Value Ref Range Status  07/09/2014 13 0 - 37 U/L Final         Failed - CBC within normal limits and completed in the last 12 months    WBC  Date Value Ref Range Status  02/20/2021 5.0 4.0 - 10.5 K/uL Final   RBC  Date Value Ref Range Status  02/20/2021 5.02 3.87 - 5.11 MIL/uL Final   Hemoglobin  Date Value Ref Range Status  02/20/2021 13.0 12.0 - 15.0 g/dL Final   HCT  Date Value Ref Range Status  02/20/2021 42.9 36.0 - 46.0 % Final   MCHC  Date Value Ref Range Status  02/20/2021 30.3 30.0 - 36.0 g/dL Final   [facility]  Date Value Ref Range Status  02/20/2021 25.9 (L) 26.0 - 34.0 pg Final   MCV  Date Value Ref Range Status  02/20/2021 85.5 80.0 - 100.0 fL Final   No results found for: "PLTCOUNTKUC", "LABPLAT", "POCPLA" RDW  Date Value Ref Range Status  02/20/2021 15.2 11.5 - 15.5 % Final         Passed - Valid encounter within last 12 months    Recent Outpatient Visits           1 month ago Essential hypertension   Primary Care at [facility], MD   4 months ago Essential hypertension   Primary Care at [facility], MD   7 months ago Colon cancer screening   [place], Michelle P, NP   8 months ago Moderate episode of recurrent major depressive disorder ([place])   [place], Michelle P, NP   9 months ago Morbid obesity ([place])   [place], Michelle P, NP       Future Appointments             In 4 months Dorna Mai, MD Primary Care at [facility]

## 2022-06-23 NOTE — Telephone Encounter (Signed)
Pt is calling to check on the status on medication refill. Please advise

## 2022-07-01 ENCOUNTER — Other Ambulatory Visit: Payer: Self-pay | Admitting: *Deleted

## 2022-07-01 NOTE — Telephone Encounter (Signed)
Patient is requesting colchicine 0.6 MG tablet [[id number]]

## 2022-07-01 NOTE — Telephone Encounter (Signed)
Pt called wanting to know why her prescriptions are not being filled.  She would like a call back [phone]

## 2022-07-01 NOTE — Telephone Encounter (Signed)
Patient is requesting colchicine 0.6 MG tablet [[id number]]

## 2022-07-02 ENCOUNTER — Other Ambulatory Visit: Payer: Self-pay | Admitting: Family Medicine

## 2022-07-02 DIAGNOSIS — I10 Essential (primary) hypertension: Secondary | ICD-10-CM

## 2022-07-02 MED ORDER — COLCHICINE 0.6 MG PO TABS
0.6000 mg | ORAL_TABLET | Freq: Every day | ORAL | 1 refills | Status: AC
  Filled 2022-07-02: qty 30, 30d supply, fill #0
  Filled 2022-08-02 – 2022-08-12 (×2): qty 30, 30d supply, fill #1
  Filled 2022-09-02: qty 30, 30d supply, fill #2
  Filled 2022-09-26: qty 30, 30d supply, fill #3
  Filled 2022-10-12 – 2022-11-07 (×2): qty 30, 30d supply, fill #4

## 2022-07-02 MED ORDER — AMLODIPINE BESYLATE 10 MG PO TABS
10.0000 mg | ORAL_TABLET | Freq: Every day | ORAL | 1 refills | Status: DC
  Filled 2022-07-02: qty 30, 30d supply, fill #0
  Filled 2022-08-02 – 2022-08-12 (×2): qty 30, 30d supply, fill #1

## 2022-07-02 MED ORDER — HYDROCHLOROTHIAZIDE 25 MG PO TABS
25.0000 mg | ORAL_TABLET | Freq: Every day | ORAL | 1 refills | Status: DC
  Filled 2022-07-02: qty 30, 30d supply, fill #0
  Filled 2022-08-02 – 2022-09-26 (×3): qty 30, 30d supply, fill #1
  Filled 2022-11-07 – 2022-11-09 (×2): qty 30, 30d supply, fill #2

## 2022-07-04 ENCOUNTER — Other Ambulatory Visit: Payer: Self-pay

## 2022-08-02 ENCOUNTER — Other Ambulatory Visit: Payer: Self-pay

## 2022-08-08 ENCOUNTER — Other Ambulatory Visit: Payer: Self-pay

## 2022-08-12 ENCOUNTER — Other Ambulatory Visit: Payer: Self-pay

## 2022-08-16 ENCOUNTER — Other Ambulatory Visit: Payer: Self-pay

## 2022-09-02 ENCOUNTER — Other Ambulatory Visit: Payer: Self-pay

## 2022-09-07 ENCOUNTER — Other Ambulatory Visit: Payer: Self-pay

## 2022-09-26 ENCOUNTER — Other Ambulatory Visit: Payer: Self-pay

## 2022-10-12 ENCOUNTER — Other Ambulatory Visit: Payer: Self-pay

## 2022-11-07 ENCOUNTER — Other Ambulatory Visit: Payer: Self-pay

## 2022-11-07 ENCOUNTER — Ambulatory Visit: Payer: Commercial Managed Care - HMO | Admitting: Family Medicine

## 2022-11-09 ENCOUNTER — Other Ambulatory Visit: Payer: Self-pay

## 2022-11-23 DIAGNOSIS — M1712 Unilateral primary osteoarthritis, left knee: Secondary | ICD-10-CM | POA: Diagnosis not present

## 2022-11-23 DIAGNOSIS — Z6841 Body Mass Index (BMI) 40.0 and over, adult: Secondary | ICD-10-CM | POA: Diagnosis not present

## 2022-11-23 DIAGNOSIS — I10 Essential (primary) hypertension: Secondary | ICD-10-CM | POA: Diagnosis not present

## 2022-11-23 DIAGNOSIS — R7309 Other abnormal glucose: Secondary | ICD-10-CM | POA: Diagnosis not present

## 2022-11-23 DIAGNOSIS — M25562 Pain in left knee: Secondary | ICD-10-CM | POA: Diagnosis not present

## 2022-11-23 DIAGNOSIS — N183 Chronic kidney disease, stage 3 unspecified: Secondary | ICD-10-CM | POA: Diagnosis not present

## 2022-11-23 DIAGNOSIS — F17210 Nicotine dependence, cigarettes, uncomplicated: Secondary | ICD-10-CM | POA: Diagnosis not present

## 2022-11-23 DIAGNOSIS — R030 Elevated blood-pressure reading, without diagnosis of hypertension: Secondary | ICD-10-CM | POA: Diagnosis not present

## 2022-11-23 DIAGNOSIS — Z79899 Other long term (current) drug therapy: Secondary | ICD-10-CM | POA: Diagnosis not present

## 2022-12-02 DIAGNOSIS — R319 Hematuria, unspecified: Secondary | ICD-10-CM | POA: Diagnosis not present

## 2022-12-02 DIAGNOSIS — N898 Other specified noninflammatory disorders of vagina: Secondary | ICD-10-CM | POA: Diagnosis not present

## 2022-12-19 DIAGNOSIS — Z419 Encounter for procedure for purposes other than remedying health state, unspecified: Secondary | ICD-10-CM | POA: Diagnosis not present

## 2022-12-24 DIAGNOSIS — M25562 Pain in left knee: Secondary | ICD-10-CM | POA: Diagnosis not present

## 2022-12-24 DIAGNOSIS — Z6841 Body Mass Index (BMI) 40.0 and over, adult: Secondary | ICD-10-CM | POA: Diagnosis not present

## 2022-12-24 DIAGNOSIS — R030 Elevated blood-pressure reading, without diagnosis of hypertension: Secondary | ICD-10-CM | POA: Diagnosis not present

## 2022-12-24 DIAGNOSIS — I10 Essential (primary) hypertension: Secondary | ICD-10-CM | POA: Diagnosis not present

## 2022-12-24 DIAGNOSIS — R7309 Other abnormal glucose: Secondary | ICD-10-CM | POA: Diagnosis not present

## 2022-12-24 DIAGNOSIS — M1712 Unilateral primary osteoarthritis, left knee: Secondary | ICD-10-CM | POA: Diagnosis not present

## 2023-01-05 ENCOUNTER — Emergency Department: Payer: 59

## 2023-01-05 ENCOUNTER — Encounter: Payer: Self-pay | Admitting: *Deleted

## 2023-01-05 ENCOUNTER — Other Ambulatory Visit: Payer: Self-pay

## 2023-01-05 ENCOUNTER — Emergency Department
Admission: EM | Admit: 2023-01-05 | Discharge: 2023-01-05 | Disposition: A | Discharge status: Home / Self Care | Payer: 59 | Attending: Emergency Medicine | Admitting: Emergency Medicine

## 2023-01-05 DIAGNOSIS — I10 Essential (primary) hypertension: Secondary | ICD-10-CM | POA: Diagnosis not present

## 2023-01-05 DIAGNOSIS — M545 Low back pain, unspecified: Secondary | ICD-10-CM | POA: Diagnosis not present

## 2023-01-05 DIAGNOSIS — M79672 Pain in left foot: Secondary | ICD-10-CM | POA: Diagnosis not present

## 2023-01-05 DIAGNOSIS — S0990XA Unspecified injury of head, initial encounter: Secondary | ICD-10-CM | POA: Insufficient documentation

## 2023-01-05 DIAGNOSIS — S199XXA Unspecified injury of neck, initial encounter: Secondary | ICD-10-CM | POA: Diagnosis not present

## 2023-01-05 DIAGNOSIS — R519 Headache, unspecified: Secondary | ICD-10-CM | POA: Diagnosis not present

## 2023-01-05 DIAGNOSIS — M79671 Pain in right foot: Secondary | ICD-10-CM | POA: Insufficient documentation

## 2023-01-05 DIAGNOSIS — Y92410 Unspecified street and highway as the place of occurrence of the external cause: Secondary | ICD-10-CM | POA: Diagnosis not present

## 2023-01-05 DIAGNOSIS — M7918 Myalgia, other site: Secondary | ICD-10-CM

## 2023-01-05 MED ORDER — OXYCODONE-ACETAMINOPHEN 5-325 MG PO TABS
1.0000 | ORAL_TABLET | Freq: Once | ORAL | Status: AC
  Administered 2023-01-05: 1 via ORAL
  Filled 2023-01-05: qty 1

## 2023-01-05 MED ORDER — CYCLOBENZAPRINE HCL 10 MG PO TABS: 10.0000 mg | ORAL_TABLET | Freq: Three times a day (TID) | ORAL | 0 refills | Status: DC | PRN

## 2023-01-05 NOTE — ED Notes (Signed)
ED Provider at bedside. 
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
## 2023-01-17 ENCOUNTER — Other Ambulatory Visit: Payer: Self-pay

## 2023-01-19 DIAGNOSIS — Z419 Encounter for procedure for purposes other than remedying health state, unspecified: Secondary | ICD-10-CM | POA: Diagnosis not present

## 2023-01-24 ENCOUNTER — Observation Stay (HOSPITAL_BASED_OUTPATIENT_CLINIC_OR_DEPARTMENT_OTHER)
Admission: EM | Admit: 2023-01-24 | Discharge: 2023-01-27 | Disposition: A | Discharge status: Home / Self Care | Payer: 59 | Attending: Internal Medicine | Admitting: Internal Medicine

## 2023-01-24 ENCOUNTER — Emergency Department (HOSPITAL_BASED_OUTPATIENT_CLINIC_OR_DEPARTMENT_OTHER): Payer: 59

## 2023-01-24 ENCOUNTER — Other Ambulatory Visit: Payer: Self-pay

## 2023-01-24 ENCOUNTER — Encounter (HOSPITAL_BASED_OUTPATIENT_CLINIC_OR_DEPARTMENT_OTHER): Payer: Self-pay | Admitting: Emergency Medicine

## 2023-01-24 DIAGNOSIS — M25572 Pain in left ankle and joints of left foot: Secondary | ICD-10-CM | POA: Insufficient documentation

## 2023-01-24 DIAGNOSIS — N179 Acute kidney failure, unspecified: Secondary | ICD-10-CM | POA: Diagnosis not present

## 2023-01-24 DIAGNOSIS — G894 Chronic pain syndrome: Secondary | ICD-10-CM | POA: Diagnosis present

## 2023-01-24 DIAGNOSIS — Z6841 Body Mass Index (BMI) 40.0 and over, adult: Secondary | ICD-10-CM | POA: Diagnosis not present

## 2023-01-24 DIAGNOSIS — R102 Pelvic and perineal pain: Secondary | ICD-10-CM | POA: Diagnosis not present

## 2023-01-24 DIAGNOSIS — Z79899 Other long term (current) drug therapy: Secondary | ICD-10-CM | POA: Diagnosis not present

## 2023-01-24 DIAGNOSIS — I10 Essential (primary) hypertension: Secondary | ICD-10-CM | POA: Insufficient documentation

## 2023-01-24 DIAGNOSIS — F17210 Nicotine dependence, cigarettes, uncomplicated: Secondary | ICD-10-CM | POA: Diagnosis not present

## 2023-01-24 DIAGNOSIS — M25552 Pain in left hip: Secondary | ICD-10-CM | POA: Insufficient documentation

## 2023-01-24 DIAGNOSIS — E876 Hypokalemia: Secondary | ICD-10-CM | POA: Diagnosis not present

## 2023-01-24 DIAGNOSIS — Z7982 Long term (current) use of aspirin: Secondary | ICD-10-CM | POA: Insufficient documentation

## 2023-01-24 DIAGNOSIS — M109 Gout, unspecified: Secondary | ICD-10-CM | POA: Diagnosis not present

## 2023-01-24 DIAGNOSIS — M25551 Pain in right hip: Secondary | ICD-10-CM | POA: Diagnosis not present

## 2023-01-24 DIAGNOSIS — R9431 Abnormal electrocardiogram [ECG] [EKG]: Secondary | ICD-10-CM | POA: Diagnosis not present

## 2023-01-24 DIAGNOSIS — M25571 Pain in right ankle and joints of right foot: Secondary | ICD-10-CM | POA: Diagnosis not present

## 2023-01-24 DIAGNOSIS — G8929 Other chronic pain: Secondary | ICD-10-CM | POA: Insufficient documentation

## 2023-01-24 DIAGNOSIS — R29898 Other symptoms and signs involving the musculoskeletal system: Secondary | ICD-10-CM | POA: Diagnosis not present

## 2023-01-24 LAB — CBC WITH DIFFERENTIAL/PLATELET
Abs Immature Granulocytes: 0.04 10*3/uL (ref 0.00–0.07)
Basophils Absolute: 0 10*3/uL (ref 0.0–0.1)
Basophils Relative: 1 %
Eosinophils Absolute: 0.1 10*3/uL (ref 0.0–0.5)
Eosinophils Relative: 1 %
HCT: 40.5 % (ref 36.0–46.0)
Hemoglobin: 13.1 g/dL (ref 12.0–15.0)
Immature Granulocytes: 1 %
Lymphocytes Relative: 30 %
Lymphs Abs: 1.8 10*3/uL (ref 0.7–4.0)
MCH: 26.6 pg (ref 26.0–34.0)
MCHC: 32.3 g/dL (ref 30.0–36.0)
MCV: 82.2 fL (ref 80.0–100.0)
Monocytes Absolute: 0.8 10*3/uL (ref 0.1–1.0)
Monocytes Relative: 14 %
Neutro Abs: 3.3 10*3/uL (ref 1.7–7.7)
Neutrophils Relative %: 53 %
Platelets: 109 10*3/uL — ABNORMAL LOW (ref 150–400)
RBC: 4.93 MIL/uL (ref 3.87–5.11)
RDW: 13.9 % (ref 11.5–15.5)
WBC: 6.1 10*3/uL (ref 4.0–10.5)
nRBC: 0 % (ref 0.0–0.2)

## 2023-01-24 LAB — COMPREHENSIVE METABOLIC PANEL
ALT: 16 U/L (ref 0–44)
AST: 14 U/L — ABNORMAL LOW (ref 15–41)
Albumin: 3.8 g/dL (ref 3.5–5.0)
Alkaline Phosphatase: 51 U/L (ref 38–126)
Anion gap: 12 (ref 5–15)
BUN: 44 mg/dL — ABNORMAL HIGH (ref 6–20)
CO2: 33 mmol/L — ABNORMAL HIGH (ref 22–32)
Calcium: 9.7 mg/dL (ref 8.9–10.3)
Chloride: 93 mmol/L — ABNORMAL LOW (ref 98–111)
Creatinine, Ser: 1.63 mg/dL — ABNORMAL HIGH (ref 0.44–1.00)
GFR, Estimated: 37 mL/min — ABNORMAL LOW (ref >60)
Glucose, Bld: 100 mg/dL — ABNORMAL HIGH (ref 70–99)
Potassium: 2.4 mmol/L — CL (ref 3.5–5.1)
Sodium: 138 mmol/L (ref 135–145)
Total Bilirubin: 1.9 mg/dL — ABNORMAL HIGH (ref 0.3–1.2)
Total Protein: 7.9 g/dL (ref 6.5–8.1)

## 2023-01-24 LAB — MAGNESIUM: Magnesium: 2.1 mg/dL (ref 1.7–2.4)

## 2023-01-24 LAB — CK: Total CK: 55 U/L (ref 38–234)

## 2023-01-24 MED ORDER — POTASSIUM CHLORIDE CRYS ER 20 MEQ PO TBCR
40.0000 meq | EXTENDED_RELEASE_TABLET | Freq: Once | ORAL | Status: AC
  Administered 2023-01-24: 40 meq via ORAL
  Filled 2023-01-24: qty 2

## 2023-01-24 MED ORDER — LACTATED RINGERS IV BOLUS
1000.0000 mL | Freq: Once | INTRAVENOUS | Status: AC
  Administered 2023-01-24: 1000 mL via INTRAVENOUS

## 2023-01-24 MED ORDER — POTASSIUM CHLORIDE CRYS ER 20 MEQ PO TBCR
40.0000 meq | EXTENDED_RELEASE_TABLET | Freq: Once | ORAL | Status: AC
  Administered 2023-01-25: 40 meq via ORAL
  Filled 2023-01-24: qty 2

## 2023-01-24 MED ORDER — MAGNESIUM SULFATE 2 GM/50ML IV SOLN
2.0000 g | Freq: Once | INTRAVENOUS | Status: AC
  Administered 2023-01-25: 2 g via INTRAVENOUS
  Filled 2023-01-24: qty 50

## 2023-01-24 MED ORDER — HYDROCODONE-ACETAMINOPHEN 5-325 MG PO TABS
1.0000 | ORAL_TABLET | Freq: Once | ORAL | Status: AC
  Administered 2023-01-24: 1 via ORAL
  Filled 2023-01-24: qty 1

## 2023-01-24 MED ORDER — POTASSIUM CHLORIDE 10 MEQ/100ML IV SOLN
10.0000 meq | INTRAVENOUS | Status: AC
  Administered 2023-01-24 – 2023-01-25 (×4): 10 meq via INTRAVENOUS
  Filled 2023-01-24 (×4): qty 100

## 2023-01-24 MED ORDER — LACTATED RINGERS IV BOLUS
1000.0000 mL | Freq: Once | INTRAVENOUS | Status: AC
  Administered 2023-01-25: 1000 mL via INTRAVENOUS
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
## 2023-01-24 NOTE — ED Notes (Signed)
Pt again refusing IV. This nurse attempted to educate pt regarding the butterfly needle vs the iv catheter; pt continues to refuse. Pt's husband is at bedside and has stated she will do the iv, but pt is adamant that she will not. Pt given time to discuss this with her family and we will reconvene shortly.
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
## 2023-01-25 DIAGNOSIS — E876 Hypokalemia: Secondary | ICD-10-CM | POA: Diagnosis not present

## 2023-01-25 DIAGNOSIS — G894 Chronic pain syndrome: Secondary | ICD-10-CM | POA: Diagnosis present

## 2023-01-25 DIAGNOSIS — Z79899 Other long term (current) drug therapy: Secondary | ICD-10-CM | POA: Diagnosis not present

## 2023-01-25 DIAGNOSIS — Z7689 Persons encountering health services in other specified circumstances: Secondary | ICD-10-CM | POA: Diagnosis not present

## 2023-01-25 DIAGNOSIS — M25572 Pain in left ankle and joints of left foot: Secondary | ICD-10-CM | POA: Diagnosis not present

## 2023-01-25 DIAGNOSIS — I10 Essential (primary) hypertension: Secondary | ICD-10-CM | POA: Diagnosis not present

## 2023-01-25 DIAGNOSIS — M25551 Pain in right hip: Secondary | ICD-10-CM | POA: Diagnosis not present

## 2023-01-25 DIAGNOSIS — Z6841 Body Mass Index (BMI) 40.0 and over, adult: Secondary | ICD-10-CM | POA: Diagnosis not present

## 2023-01-25 DIAGNOSIS — N179 Acute kidney failure, unspecified: Secondary | ICD-10-CM | POA: Diagnosis not present

## 2023-01-25 DIAGNOSIS — F17210 Nicotine dependence, cigarettes, uncomplicated: Secondary | ICD-10-CM | POA: Diagnosis not present

## 2023-01-25 DIAGNOSIS — Z7982 Long term (current) use of aspirin: Secondary | ICD-10-CM | POA: Diagnosis not present

## 2023-01-25 DIAGNOSIS — M25552 Pain in left hip: Secondary | ICD-10-CM | POA: Diagnosis not present

## 2023-01-25 DIAGNOSIS — M25571 Pain in right ankle and joints of right foot: Secondary | ICD-10-CM | POA: Diagnosis present

## 2023-01-25 DIAGNOSIS — M109 Gout, unspecified: Secondary | ICD-10-CM | POA: Diagnosis not present

## 2023-01-25 DIAGNOSIS — G8929 Other chronic pain: Secondary | ICD-10-CM | POA: Diagnosis not present

## 2023-01-25 LAB — URINALYSIS, ROUTINE W REFLEX MICROSCOPIC
Bacteria, UA: NONE SEEN
Bilirubin Urine: NEGATIVE
Glucose, UA: NEGATIVE mg/dL
Ketones, ur: NEGATIVE mg/dL
Leukocytes,Ua: NEGATIVE
Nitrite: NEGATIVE
Specific Gravity, Urine: 1.016 (ref 1.005–1.030)
pH: 6.5 (ref 5.0–8.0)

## 2023-01-25 LAB — BASIC METABOLIC PANEL
Anion gap: 10 (ref 5–15)
Anion gap: 9 (ref 5–15)
Anion gap: 12 (ref 5–15)
BUN: 40 mg/dL — ABNORMAL HIGH (ref 6–20)
BUN: 37 mg/dL — ABNORMAL HIGH (ref 6–20)
BUN: 35 mg/dL — ABNORMAL HIGH (ref 6–20)
CO2: 32 mmol/L (ref 22–32)
CO2: 29 mmol/L (ref 22–32)
CO2: 25 mmol/L (ref 22–32)
Calcium: 9.1 mg/dL (ref 8.9–10.3)
Calcium: 9.3 mg/dL (ref 8.9–10.3)
Calcium: 8.7 mg/dL — ABNORMAL LOW (ref 8.9–10.3)
Chloride: 95 mmol/L — ABNORMAL LOW (ref 98–111)
Chloride: 99 mmol/L (ref 98–111)
Chloride: 98 mmol/L (ref 98–111)
Creatinine, Ser: 1.48 mg/dL — ABNORMAL HIGH (ref 0.44–1.00)
Creatinine, Ser: 1.33 mg/dL — ABNORMAL HIGH (ref 0.44–1.00)
Creatinine, Ser: 1.41 mg/dL — ABNORMAL HIGH (ref 0.44–1.00)
GFR, Estimated: 42 mL/min — ABNORMAL LOW (ref >60)
GFR, Estimated: 48 mL/min — ABNORMAL LOW (ref >60)
GFR, Estimated: 45 mL/min — ABNORMAL LOW (ref >60)
Glucose, Bld: 113 mg/dL — ABNORMAL HIGH (ref 70–99)
Glucose, Bld: 97 mg/dL (ref 70–99)
Glucose, Bld: 101 mg/dL — ABNORMAL HIGH (ref 70–99)
Potassium: 2.5 mmol/L — CL (ref 3.5–5.1)
Potassium: 3.3 mmol/L — ABNORMAL LOW (ref 3.5–5.1)
Potassium: 3 mmol/L — ABNORMAL LOW (ref 3.5–5.1)
Sodium: 137 mmol/L (ref 135–145)
Sodium: 137 mmol/L (ref 135–145)
Sodium: 135 mmol/L (ref 135–145)

## 2023-01-25 LAB — URIC ACID: Uric Acid, Serum: 12.2 mg/dL — ABNORMAL HIGH (ref 2.5–7.1)

## 2023-01-25 LAB — HIV ANTIBODY (ROUTINE TESTING W REFLEX): HIV Screen 4th Generation wRfx: NONREACTIVE

## 2023-01-25 MED ORDER — ACETAMINOPHEN 650 MG RE SUPP: 650.0000 mg | Freq: Four times a day (QID) | RECTAL | Status: DC | PRN

## 2023-01-25 MED ORDER — HYDRALAZINE HCL 20 MG/ML IJ SOLN: 5.0000 mg | INTRAMUSCULAR | Status: DC | PRN

## 2023-01-25 MED ORDER — POTASSIUM CHLORIDE IN NACL 20-0.9 MEQ/L-% IV SOLN
INTRAVENOUS | Status: DC
  Filled 2023-01-25: qty 1000

## 2023-01-25 MED ORDER — BISACODYL 5 MG PO TBEC: 5.0000 mg | DELAYED_RELEASE_TABLET | Freq: Every day | ORAL | Status: DC | PRN

## 2023-01-25 MED ORDER — COLCHICINE 0.6 MG PO TABS: 0.6000 mg | ORAL_TABLET | Freq: Every day | ORAL | Status: DC | PRN

## 2023-01-25 MED ORDER — ENOXAPARIN SODIUM 40 MG/0.4ML IJ SOSY
40.0000 mg | PREFILLED_SYRINGE | INTRAMUSCULAR | Status: DC
  Filled 2023-01-25 (×4): qty 0.4

## 2023-01-25 MED ORDER — AMLODIPINE BESYLATE 10 MG PO TABS
10.0000 mg | ORAL_TABLET | Freq: Every day | ORAL | Status: DC
  Administered 2023-01-25 – 2023-01-26 (×2): 10 mg via ORAL
  Filled 2023-01-25 (×2): qty 1

## 2023-01-25 MED ORDER — OXYCODONE HCL 5 MG PO TABS
5.0000 mg | ORAL_TABLET | Freq: Three times a day (TID) | ORAL | Status: DC | PRN
  Administered 2023-01-25: 5 mg via ORAL
  Filled 2023-01-25: qty 1

## 2023-01-25 MED ORDER — OXYCODONE HCL 5 MG PO TABS
5.0000 mg | ORAL_TABLET | Freq: Three times a day (TID) | ORAL | Status: DC
  Administered 2023-01-25 – 2023-01-27 (×6): 5 mg via ORAL
  Filled 2023-01-25 (×6): qty 1

## 2023-01-25 MED ORDER — COLCHICINE 0.6 MG PO TABS
0.6000 mg | ORAL_TABLET | Freq: Every day | ORAL | Status: DC
  Filled 2023-01-25: qty 1

## 2023-01-25 MED ORDER — NICOTINE 14 MG/24HR TD PT24
14.0000 mg | MEDICATED_PATCH | Freq: Every day | TRANSDERMAL | Status: DC
  Filled 2023-01-25 (×3): qty 1

## 2023-01-25 MED ORDER — POTASSIUM CHLORIDE IN NACL 40-0.9 MEQ/L-% IV SOLN: INTRAVENOUS | Status: DC

## 2023-01-25 MED ORDER — DOCUSATE SODIUM 100 MG PO CAPS
100.0000 mg | ORAL_CAPSULE | Freq: Two times a day (BID) | ORAL | Status: DC
  Administered 2023-01-25 – 2023-01-27 (×5): 100 mg via ORAL
  Filled 2023-01-25 (×6): qty 1

## 2023-01-25 MED ORDER — POTASSIUM CHLORIDE 10 MEQ/100ML IV SOLN
10.0000 meq | INTRAVENOUS | Status: AC
  Administered 2023-01-25 (×4): 10 meq via INTRAVENOUS
  Filled 2023-01-25 (×4): qty 100

## 2023-01-25 MED ORDER — SODIUM CHLORIDE 0.9% FLUSH
3.0000 mL | Freq: Two times a day (BID) | INTRAVENOUS | Status: DC
  Administered 2023-01-25 – 2023-01-26 (×3): 3 mL via INTRAVENOUS

## 2023-01-25 MED ORDER — ONDANSETRON HCL 4 MG/2ML IJ SOLN: 4.0000 mg | Freq: Four times a day (QID) | INTRAMUSCULAR | Status: DC | PRN

## 2023-01-25 MED ORDER — POTASSIUM CHLORIDE 10 MEQ/100ML IV SOLN: 10.0000 meq | Freq: Once | INTRAVENOUS | Status: DC

## 2023-01-25 MED ORDER — ONDANSETRON HCL 4 MG PO TABS: 4.0000 mg | ORAL_TABLET | Freq: Four times a day (QID) | ORAL | Status: DC | PRN

## 2023-01-25 MED ORDER — POLYETHYLENE GLYCOL 3350 17 G PO PACK: 17.0000 g | PACK | Freq: Every day | ORAL | Status: DC | PRN

## 2023-01-25 MED ORDER — AMLODIPINE BESYLATE 5 MG PO TABS: 10.0000 mg | ORAL_TABLET | Freq: Every day | ORAL | Status: DC

## 2023-01-25 MED ORDER — POTASSIUM CHLORIDE CRYS ER 20 MEQ PO TBCR
40.0000 meq | EXTENDED_RELEASE_TABLET | ORAL | Status: AC
  Administered 2023-01-25 (×2): 40 meq via ORAL
  Filled 2023-01-25 (×2): qty 2

## 2023-01-25 MED ORDER — ASPIRIN 81 MG PO TBEC
81.0000 mg | DELAYED_RELEASE_TABLET | Freq: Every day | ORAL | Status: DC
  Administered 2023-01-25 – 2023-01-27 (×3): 81 mg via ORAL
  Filled 2023-01-25 (×3): qty 1

## 2023-01-25 MED ORDER — CYCLOBENZAPRINE HCL 10 MG PO TABS: 10.0000 mg | ORAL_TABLET | Freq: Three times a day (TID) | ORAL | Status: DC | PRN

## 2023-01-25 MED ORDER — ALLOPURINOL 100 MG PO TABS: 100.0000 mg | ORAL_TABLET | Freq: Every day | ORAL | Status: DC

## 2023-01-25 MED ORDER — ACETAMINOPHEN 325 MG PO TABS
650.0000 mg | ORAL_TABLET | Freq: Four times a day (QID) | ORAL | Status: DC | PRN
  Administered 2023-01-25: 650 mg via ORAL
  Filled 2023-01-25: qty 2

## 2023-01-25 MED ORDER — METHOCARBAMOL 500 MG PO TABS
500.0000 mg | ORAL_TABLET | Freq: Four times a day (QID) | ORAL | Status: DC | PRN
  Administered 2023-01-25 – 2023-01-26 (×2): 500 mg via ORAL
  Filled 2023-01-25 (×2): qty 1

## 2023-01-25 MED ORDER — ALLOPURINOL 100 MG PO TABS
100.0000 mg | ORAL_TABLET | Freq: Every day | ORAL | Status: DC
  Administered 2023-01-25: 100 mg via ORAL
  Filled 2023-01-25: qty 1

## 2023-01-25 MED ORDER — POTASSIUM CHLORIDE CRYS ER 20 MEQ PO TBCR: 40.0000 meq | EXTENDED_RELEASE_TABLET | Freq: Once | ORAL | Status: DC

## 2023-01-25 NOTE — Progress Notes (Signed)
Patient request that her bed be elevated I explained that for safety the bed should be at the lowest level she states that she cannot sleep with her bed to low she feels suffocated. I raised the the to patient desired height and asked her to call for help if needed. She said her husband is here. Again I asked for her to call for help.

## 2023-01-25 NOTE — Progress Notes (Signed)
Dr. Posey Pronto notified that patient is refusing labs

## 2023-01-25 NOTE — Progress Notes (Signed)
Had conversation with patient she feels like nobody is respecting her. She stated that the labs were drawn earlier why do they keep drawing labs. I explained MD orders the labs to make sure you levels are normal and stable. She then ask why do the keep taking them I told her to make sure the results remain stable enough for you to be discharged. She then stated that she is going home tomorrow and will follow up with primary MD. I apologized that she she feel staff is miss treating her. I told her that I asked the charge RN to come and see what her needs were because I was doing my medication pass and I knew it would be awhile before I can come

## 2023-01-25 NOTE — ED Notes (Signed)
Handoff report given to [facility] ?

## 2023-01-25 NOTE — Progress Notes (Signed)
OT Cancellation Note  Patient Details Name: Vickie Patel MRN: [id number] DOB: Jul 03, 1971   Cancelled Treatment:    Reason Eval/Treat Not Completed: Patient declined, no reason specified. Upon arrival, pt had just participated in PT eval with max encouragement. Pt frustrated for being asked to move and with negative behavior on arrival prior to OT introducing self. Pt reporting that she needs to hear from MD that she needs to move before being willing; stating that staff knows "nothing about gout". Notified pt of imminent dc orders for therapies to meet her to ensure safe discharge disposition as well as to navigate any resources she may need; pt not agreeable to session. Per PT, pt donned socks with supervision, but unable to come to full stand on two attempts this session, thus, PT recommending SNF. Will re-attempt as time allows.   Elder Cyphers, OTR/L [facility] Office: [phone]   Magnus Ivan 01/25/2023, 3:50 PM
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
## 2023-01-25 NOTE — ED Notes (Signed)
Unable to collect urine specimen due to BM.

## 2023-01-25 NOTE — Care Management (Signed)
  Transition of Care [facility]) Screening Note   Patient Details  Name: Vickie Patel Date of Birth: Dec 15, 1971   Transition [facility] Care [facility]) CM/SW Contact:    Carles Collet, RN Phone Number: 01/25/2023, 1:15 PM    Transition of Care Department [facility]) has reviewed patient and we will continue to monitor patient advancement through interdisciplinary progression rounds.    From home w spouse. Foot/ hip pain, awaiting PT OT consults. No HH showing in [place]

## 2023-01-25 NOTE — ED Notes (Signed)
Patient refusing Enoxaparin injection at this time.  States will think about it.

## 2023-01-25 NOTE — Progress Notes (Signed)
Patient is refusing lab draws

## 2023-01-25 NOTE — ED Notes (Signed)
Pt had large BM on bedpan and one count of urination. Pt cleaned and bed changed.

## 2023-01-25 NOTE — Progress Notes (Addendum)
MD notified of patient refusal of lovenox injection.   Patient refusing to work with OT, PT and lab draws. Patient states she will attempt to work with therapy tomorrow.
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
## 2023-01-25 NOTE — ED Notes (Signed)
Called [facility] -- spoke to [place], informed that the patient Bed Assignment is Ready. Marcello Moores informed that transport will be en route as soon as possible.

## 2023-01-25 NOTE — ED Notes (Signed)
Called [facility]; was informed that they're waiting on d/c

## 2023-01-26 DIAGNOSIS — G8100 Flaccid hemiplegia affecting unspecified side: Secondary | ICD-10-CM

## 2023-01-26 DIAGNOSIS — E876 Hypokalemia: Secondary | ICD-10-CM | POA: Diagnosis not present

## 2023-01-26 DIAGNOSIS — I10 Essential (primary) hypertension: Secondary | ICD-10-CM | POA: Diagnosis not present

## 2023-01-26 DIAGNOSIS — N179 Acute kidney failure, unspecified: Secondary | ICD-10-CM | POA: Diagnosis not present

## 2023-01-26 LAB — CBC
HCT: 39.8 % (ref 36.0–46.0)
Hemoglobin: 12.5 g/dL (ref 12.0–15.0)
MCH: 26.5 pg (ref 26.0–34.0)
MCHC: 31.4 g/dL (ref 30.0–36.0)
MCV: 84.5 fL (ref 80.0–100.0)
Platelets: 128 10*3/uL — ABNORMAL LOW (ref 150–400)
RBC: 4.71 MIL/uL (ref 3.87–5.11)
RDW: 14 % (ref 11.5–15.5)
WBC: 3.8 10*3/uL — ABNORMAL LOW (ref 4.0–10.5)
nRBC: 0 % (ref 0.0–0.2)

## 2023-01-26 LAB — TSH: TSH: 4.526 u[IU]/mL — ABNORMAL HIGH (ref 0.350–4.500)

## 2023-01-26 MED ORDER — POTASSIUM CHLORIDE CRYS ER 20 MEQ PO TBCR
40.0000 meq | EXTENDED_RELEASE_TABLET | Freq: Three times a day (TID) | ORAL | Status: AC
  Administered 2023-01-26 (×3): 40 meq via ORAL
  Filled 2023-01-26 (×3): qty 2
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
## 2023-01-26 NOTE — Plan of Care (Signed)

## 2023-01-26 NOTE — Plan of Care (Signed)
  Problem: Education: Goal: Knowledge of General Education information will improve Description: Including pain rating scale, medication(s)/side effects and non-pharmacologic comfort measures Outcome: Progressing   Problem: Health Behavior/Discharge Planning: Goal: Ability to manage health-related needs will improve Outcome: Progressing   Problem: Clinical Measurements: Goal: Ability to maintain clinical measurements within normal limits will improve 01/26/2023 2347 by Milinda Antis, RN Outcome: Progressing 01/26/2023 2346 by Milinda Antis, RN Outcome: Progressing Goal: Cardiovascular complication will be avoided Outcome: Progressing   Problem: Activity: Goal: Risk for activity intolerance will decrease Outcome: Progressing   Problem: Nutrition: Goal: Adequate nutrition will be maintained Outcome: Progressing   Problem: Pain Managment: Goal: General experience of comfort will improve Outcome: Progressing   Problem: Skin Integrity: Goal: Risk for impaired skin integrity will decrease 01/26/2023 2347 by Milinda Antis, RN Outcome: Progressing 01/26/2023 2346 by Milinda Antis, RN Outcome: Progressing

## 2023-01-26 NOTE — Progress Notes (Signed)
Jonny Ruiz NP notified via chat that patient is refusing labs

## 2023-01-26 NOTE — TOC Initial Note (Addendum)
Transition of Care (TOC) - Initial/Assessment Note   Discussed rec for SNF with patient and visitor at bedside. Patient is feeling better today and feels that she will be "able to move around better today". She does want a wheelchair and mention wanting a 3 point cane ( [employer] does not have 3 point canes) .   Yesterday patient needed 2 person assist. Patient from home with spouse and has 4 sons to assist  Secure chatted PT/OT and will await updated evaluations.   Placed order for 3 in 1 and wheelchair  narrative for wheelchair . Asked MD to sign  Patient Details  Name: Vickie Patel MRN: [id number] Date of Birth: [birth year]  Transition of Care [facility]) CM/SW Contact:    Marilu Favre, RN Phone Number: 01/26/2023, 10:29 AM  Clinical Narrative:                   Expected Discharge Plan: Home/Self Care Barriers to Discharge: Continued Medical Work up   Patient Goals and CMS Choice Patient states their goals for this hospitalization and ongoing recovery are:: to return to home CMS Medicare.gov Compare Post Acute Care list provided to:: Patient Choice offered to / list presented to : Patient      Expected Discharge Plan and Services     Post Acute Care Choice: Durable Medical Equipment Living arrangements for the past 2 months: Apartment                 DME Arranged: Wheelchair manual Judie Petit) DME Agency: [employer]                  Prior Living Arrangements/Services Living arrangements for the past 2 months: Apartment Lives with:: Spouse Patient language and need for interpreter reviewed:: Yes Do you feel safe going back to the place where you live?: Yes      Need for Family Participation in Patient Care: Yes (Comment) Care giver support system in place?: Yes (comment) Current home services: DME Criminal Activity/Legal Involvement Pertinent to Current Situation/Hospitalization: No - Comment as needed  Activities of Daily Living Home Assistive Devices/Equipment:  None ADL Screening (condition at time of admission) Patient's cognitive ability adequate to safely complete daily activities?: Yes Is the patient deaf or have difficulty hearing?: No Does the patient have difficulty seeing, even when wearing glasses/contacts?: No Does the patient have difficulty concentrating, remembering, or making decisions?: No Patient able to express need for assistance with ADLs?: Yes Does the patient have difficulty dressing or bathing?: No Independently performs ADLs?: Yes (appropriate for developmental age) Does the patient have difficulty walking or climbing stairs?: No Weakness of Legs: None Weakness of Arms/Hands: None  Permission Sought/Granted   Permission granted to share information with : Yes, Verbal Permission Granted  Share Information with NAME: visitor at bedside           Emotional Assessment Appearance:: Appears stated age     Orientation: : Oriented to Self, Oriented to Place, Oriented to  Time Alcohol / Substance Use: Not Applicable Psych Involvement: No (comment)  Admission diagnosis:  Hypokalemia [E87.6] AKI (acute kidney injury) ([place]) [N17.9] Patient Active Problem List   Diagnosis Date Noted   Hypokalemia 01/25/2023   Gout 01/25/2023   Chronic pain disorder 01/25/2023   Essential hypertension 08/06/2015   Hypertensive heart disease 08/06/2015   Morbid obesity ([place]) 08/06/2015   Cardiac murmur 08/06/2015   PCP:  Dorna Mai, MD Pharmacy:   [facility] [address], [place] [address] Phone: [phone] Fax: [phone]  [id number] [address] [place]Phone: [phone]: [phone] [facility] [address] [address] [place] [address]: [phone]: [phone]: [phone]: [phone]    Social Determinants of Health (SDOH) Social History: SDOH Screenings   Food Insecurity: No Food Insecurity (01/25/2023)  Housing: Low Risk  (01/25/2023)  Transportation Needs: No Transportation Needs (01/25/2023)  Utilities: Not At Risk (01/25/2023)  Depression (PHQ2-9): Low Risk  (02/08/2022)  Tobacco Use: High Risk (01/24/2023)   SDOH Interventions:     Readmission Risk Interventions     No data to display

## 2023-01-26 NOTE — Progress Notes (Signed)
I was informed by unit secretary patient needed assistance and her assigned nurse is busy helping other patients. Upon arrival to patient's room I knocked at her door and I asked her if she needed help with anything. She got upset and stated " who are you and I do not feel your vibe". I introduced myself to the patient and I asked her if she needed help with anything. She refused my help.

## 2023-01-26 NOTE — Progress Notes (Signed)
Refused labs.

## 2023-01-26 NOTE — Progress Notes (Signed)
OT Cancellation Note  Patient Details Name: Vickie Patel MRN: [id number] DOB: 08-15-1971   Cancelled Treatment:    Reason Eval/Treat Not Completed: Patient declined, no reason specified.  Patient stating she doesn't have her mind right, and is feeling tired and irritable.  OT can try one additional attempt to encourage participation in order to provide assist as needed.    [name] [name] 01/26/2023, 3:35 PM 01/26/2023  RP, OTR/L  Acute Rehabilitation Services  Office:  [phone]

## 2023-01-26 NOTE — Progress Notes (Signed)
OT Cancellation Note  Patient Details Name: MAURISHA MONGEAU MRN: [id number] DOB: 06/03/1971   Cancelled Treatment:    Reason Eval/Treat Not Completed: Patient declined, no reason specified.  Elder Cyphers, OTR/L [facility] Acute Rehabilitation Office: [phone]   Magnus Ivan 01/26/2023, 2:11 PM

## 2023-01-26 NOTE — Progress Notes (Signed)
Nutrition Brief Note  Consult received for nutrition goals. Patient with TOC and then PT at time of visit. Unable to perform full nutrition assessment at this time. Will attempt to follow up prior to discharge.   Wt Readings from Last 15 Encounters:  01/24/23 (!) 145.2 kg  01/05/23 (!) 151 kg  02/08/22 (!) 151.1 kg  11/02/21 (!) 151.9 kg  10/05/21 (!) 154.9 kg  08/31/21 (!) 154.9 kg  09/10/20 136.1 kg  07/22/20 (!) 149.1 kg  06/15/20 136.1 kg  05/06/20 136.1 kg  04/08/20 136.1 kg  02/16/16 (!) 154.2 kg  11/19/15 (!) 154.7 kg  10/02/15 (!) 156.6 kg  08/06/15 (!) 155.2 kg    Body mass index is 53.25 kg/m. Patient meets criteria for morbid obesity based on current BMI.   Current diet order is Regular, patient is consuming approximately 100% of meals at this time. Labs and medications reviewed.   Clayborne Dana, RDN, LDN Clinical Nutrition

## 2023-01-26 NOTE — TOC CM/SW Note (Addendum)
    Durable Medical Equipment  (From admission, onward)           Start     Ordered   01/26/23 1034  For home use only DME standard manual wheelchair with seat cushion  Once       Comments: Patient suffers from recent decline in their functional status, which impairs their ability to perform daily activities like ambulating  in the home.  A walker  will not resolve issue with performing activities of daily living. A wheelchair will allow patient to safely perform daily activities. Patient can safely propel the wheelchair in the home or has a caregiver who can provide assistance. Length of need lifetime . Accessories: elevating leg rests (ELRs), wheel locks, extensions and anti-tippers.  Seat and back cushions   Bariatric   Wt 145.2 kg ht 5'5"   01/26/23 1034   01/26/23 1032  For home use only DME 3 n 1  Once       Comments: Bariatric  Ht 5'5" and wt 145.2 kg   01/26/23 1033           Patient confined to a room with no bathroom and unable to ambulate to a bathroom therefore needs a bedside commode

## 2023-01-27 ENCOUNTER — Other Ambulatory Visit (HOSPITAL_COMMUNITY): Payer: Self-pay

## 2023-01-27 DIAGNOSIS — I10 Essential (primary) hypertension: Secondary | ICD-10-CM | POA: Diagnosis not present

## 2023-01-27 DIAGNOSIS — G8190 Hemiplegia, unspecified affecting unspecified side: Secondary | ICD-10-CM | POA: Diagnosis not present

## 2023-01-27 DIAGNOSIS — N179 Acute kidney failure, unspecified: Secondary | ICD-10-CM | POA: Diagnosis not present

## 2023-01-27 DIAGNOSIS — E876 Hypokalemia: Secondary | ICD-10-CM | POA: Diagnosis not present

## 2023-01-27 DIAGNOSIS — G8100 Flaccid hemiplegia affecting unspecified side: Secondary | ICD-10-CM | POA: Diagnosis not present

## 2023-01-27 DIAGNOSIS — Z7401 Bed confinement status: Secondary | ICD-10-CM | POA: Diagnosis not present

## 2023-01-27 DIAGNOSIS — R531 Weakness: Secondary | ICD-10-CM | POA: Diagnosis not present

## 2023-01-27 DIAGNOSIS — Z743 Need for continuous supervision: Secondary | ICD-10-CM | POA: Diagnosis not present

## 2023-01-27 LAB — BASIC METABOLIC PANEL
Anion gap: 13 (ref 5–15)
BUN: 25 mg/dL — ABNORMAL HIGH (ref 6–20)
CO2: 22 mmol/L (ref 22–32)
Calcium: 8.8 mg/dL — ABNORMAL LOW (ref 8.9–10.3)
Chloride: 103 mmol/L (ref 98–111)
Creatinine, Ser: 1.36 mg/dL — ABNORMAL HIGH (ref 0.44–1.00)
GFR, Estimated: 47 mL/min — ABNORMAL LOW (ref >60)
Glucose, Bld: 87 mg/dL (ref 70–99)
Potassium: 3.5 mmol/L (ref 3.5–5.1)
Sodium: 138 mmol/L (ref 135–145)

## 2023-01-27 MED ORDER — TRIAMTERENE-HCTZ 37.5-25 MG PO TABS
1.0000 | ORAL_TABLET | Freq: Every day | ORAL | Status: DC
  Filled 2023-01-27: qty 1

## 2023-01-27 MED ORDER — POTASSIUM CHLORIDE CRYS ER 20 MEQ PO TBCR
40.0000 meq | EXTENDED_RELEASE_TABLET | Freq: Three times a day (TID) | ORAL | Status: DC
  Administered 2023-01-27: 40 meq via ORAL
  Filled 2023-01-27: qty 2

## 2023-01-27 MED ORDER — TRIAMTERENE-HCTZ 37.5-25 MG PO TABS
1.0000 | ORAL_TABLET | Freq: Every day | ORAL | 0 refills | Status: DC
  Filled 2023-01-27: qty 30, 30d supply, fill #0

## 2023-01-27 NOTE — TOC Progression Note (Addendum)
Transition of Care (TOC) - Progression Note     Spoke to patient at bedside. PT recommending SNF , wheel chair, 3 in 1 , hospital bed and hoyer lift .  Per PT note patient cannot transport by private car. Patient agreeable to ambulance  Received orders for home health PT,OT.  Discussed above. Patient declining SNF, agreeable to home health.   Confirmed face sheet information.   Patient wanting wheel chair, 3 in 1 , 4 point cane, and Rolator.   Explained insurance does not usually cover wheel chair, cane and rolator at one time.   OT entered room and will work with patient and update recommendations.   Amy with [facility] accepted referral for HHPT/OT   Await updated DME recommendations.   Spoke to patient , OT and PT and patient's family member . PT recommending bariatric wheelchair, 3 in1 . Patient in agreement. Patient does not want hospital bed or lift.   DME ordered with Jermain with [employer]. Patient going home by ambulance, confirmed address. Ambulance will not transport DME. DME can be delivered to hospital room and family transport DME home or can request DME to be delivered to home. Patient wants DME delivered to home.   Secure chatted MD again to sign DME progress note. Needed for insurance purposes   PTAR called  Patient Details  Name: Vickie Patel MRN: [id number] Date of Birth: 03/15/1971  Transition [facility]) CM/SW Contact  Merrick Feutz, Edson Snowball, RN Phone Number: 01/27/2023, 10:07 AM  Clinical Narrative:       Expected Discharge Plan: Home/Self Care Barriers to Discharge: Continued Medical Work up  Expected Discharge Plan and Services     Post Acute Care Choice: Durable Medical Equipment Living arrangements for the past 2 months: Apartment Expected Discharge Date: 01/27/23               DME Arranged: Wheelchair manual Judie Petit) DME Agency: [employer]                   Social Determinants of Health (SDOH) Interventions SDOH Screenings   Food  Insecurity: No Food Insecurity (01/25/2023)  Housing: Low Risk  (01/25/2023)  Transportation Needs: No Transportation Needs (01/25/2023)  Utilities: Not At Risk (01/25/2023)  Depression (PHQ2-9): Low Risk  (02/08/2022)  Tobacco Use: High Risk (01/24/2023)    Readmission Risk Interventions     No data to display
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
## 2023-01-30 ENCOUNTER — Telehealth: Payer: Self-pay

## 2023-01-30 NOTE — Transitions of Care (Post Inpatient/ED Visit) (Signed)
   01/30/2023  Name: FEVEN ALDERFER MRN: [id number] DOB: 10-07-1971  Today's TOC FU Call Status: Today's TOC FU Call Status:: Successful TOC FU Call Competed TOC FU Call Complete Date: 01/30/23  Transition Care Management Follow-up Telephone Call Date of Discharge: 01/27/23 Discharge Facility: [facility] Type of Discharge: Inpatient Admission Primary Inpatient Discharge Diagnosis:: hypokalemia How have you been since you were released from the hospital?: Better Any questions or concerns?: No  Items Reviewed: Did you receive and understand the discharge instructions provided?: Yes Medications obtained and verified?: Yes (Medications Reviewed) Any new allergies since your discharge?: No Dietary orders reviewed?: NA Do you have support at home?: Yes  Home Care and Equipment/Supplies: [place] Ordered?: NA Any new equipment or medical supplies ordered?: NA  Functional Questionnaire: Do you need assistance with bathing/showering or dressing?: No Do you need assistance with meal preparation?: No Do you need assistance with eating?: No Do you have difficulty maintaining continence: No Do you need assistance with getting out of bed/getting out of a chair/moving?: No Do you have difficulty managing or taking your medications?: No  Folllow up appointments reviewed: PCP Follow-up appointment confirmed?: No MD Provider Line Number:[phone] Given: Yes [facility] Follow-up appointment confirmed?: NA Do you need transportation to your follow-up appointment?: No Do you understand care options if your condition(s) worsen?: Yes-patient verbalized understanding  Pt refused to make fu appt- states that she will call back to make appt     [place] LPN [place] [phone]

## 2023-02-01 DIAGNOSIS — I10 Essential (primary) hypertension: Secondary | ICD-10-CM | POA: Diagnosis not present

## 2023-02-01 DIAGNOSIS — N1831 Chronic kidney disease, stage 3a: Secondary | ICD-10-CM | POA: Diagnosis not present

## 2023-02-01 DIAGNOSIS — Z6841 Body Mass Index (BMI) 40.0 and over, adult: Secondary | ICD-10-CM | POA: Diagnosis not present

## 2023-02-01 DIAGNOSIS — R030 Elevated blood-pressure reading, without diagnosis of hypertension: Secondary | ICD-10-CM | POA: Diagnosis not present

## 2023-02-01 DIAGNOSIS — M1712 Unilateral primary osteoarthritis, left knee: Secondary | ICD-10-CM | POA: Diagnosis not present

## 2023-02-01 DIAGNOSIS — E876 Hypokalemia: Secondary | ICD-10-CM | POA: Diagnosis not present

## 2023-02-01 DIAGNOSIS — M25562 Pain in left knee: Secondary | ICD-10-CM | POA: Diagnosis not present

## 2023-02-01 DIAGNOSIS — Z79899 Other long term (current) drug therapy: Secondary | ICD-10-CM | POA: Diagnosis not present

## 2023-02-01 DIAGNOSIS — Z1231 Encounter for screening mammogram for malignant neoplasm of breast: Secondary | ICD-10-CM | POA: Diagnosis not present

## 2023-02-01 DIAGNOSIS — R7309 Other abnormal glucose: Secondary | ICD-10-CM | POA: Diagnosis not present

## 2023-02-06 ENCOUNTER — Observation Stay (HOSPITAL_BASED_OUTPATIENT_CLINIC_OR_DEPARTMENT_OTHER): Payer: 59

## 2023-02-06 ENCOUNTER — Other Ambulatory Visit: Payer: Self-pay

## 2023-02-06 ENCOUNTER — Emergency Department (HOSPITAL_COMMUNITY): Payer: 59

## 2023-02-06 ENCOUNTER — Encounter (HOSPITAL_COMMUNITY): Payer: Self-pay | Admitting: Internal Medicine

## 2023-02-06 ENCOUNTER — Observation Stay (HOSPITAL_COMMUNITY)
Admission: EM | Admit: 2023-02-06 | Discharge: 2023-02-07 | Disposition: A | Discharge status: Home Health | Payer: 59 | Attending: Family Medicine | Admitting: Family Medicine

## 2023-02-06 DIAGNOSIS — Z7982 Long term (current) use of aspirin: Secondary | ICD-10-CM | POA: Diagnosis not present

## 2023-02-06 DIAGNOSIS — M549 Dorsalgia, unspecified: Secondary | ICD-10-CM | POA: Diagnosis not present

## 2023-02-06 DIAGNOSIS — R079 Chest pain, unspecified: Secondary | ICD-10-CM

## 2023-02-06 DIAGNOSIS — I10 Essential (primary) hypertension: Secondary | ICD-10-CM | POA: Diagnosis not present

## 2023-02-06 DIAGNOSIS — F17210 Nicotine dependence, cigarettes, uncomplicated: Secondary | ICD-10-CM | POA: Diagnosis not present

## 2023-02-06 DIAGNOSIS — E876 Hypokalemia: Secondary | ICD-10-CM | POA: Diagnosis present

## 2023-02-06 DIAGNOSIS — R531 Weakness: Secondary | ICD-10-CM | POA: Diagnosis not present

## 2023-02-06 DIAGNOSIS — I517 Cardiomegaly: Secondary | ICD-10-CM

## 2023-02-06 DIAGNOSIS — Z6841 Body Mass Index (BMI) 40.0 and over, adult: Secondary | ICD-10-CM | POA: Diagnosis not present

## 2023-02-06 DIAGNOSIS — R072 Precordial pain: Secondary | ICD-10-CM | POA: Diagnosis not present

## 2023-02-06 DIAGNOSIS — M109 Gout, unspecified: Secondary | ICD-10-CM | POA: Diagnosis present

## 2023-02-06 DIAGNOSIS — R0789 Other chest pain: Principal | ICD-10-CM | POA: Insufficient documentation

## 2023-02-06 LAB — HEPATIC FUNCTION PANEL
ALT: 18 U/L (ref 0–44)
AST: 19 U/L (ref 15–41)
Albumin: 2.8 g/dL — ABNORMAL LOW (ref 3.5–5.0)
Alkaline Phosphatase: 52 U/L (ref 38–126)
Bilirubin, Direct: 0.2 mg/dL (ref 0.0–0.2)
Indirect Bilirubin: 0.7 mg/dL (ref 0.3–0.9)
Total Bilirubin: 0.9 mg/dL (ref 0.3–1.2)
Total Protein: 6.9 g/dL (ref 6.5–8.1)

## 2023-02-06 LAB — BASIC METABOLIC PANEL
Anion gap: 10 (ref 5–15)
BUN: 18 mg/dL (ref 6–20)
CO2: 25 mmol/L (ref 22–32)
Calcium: 8.8 mg/dL — ABNORMAL LOW (ref 8.9–10.3)
Chloride: 101 mmol/L (ref 98–111)
Creatinine, Ser: 1.2 mg/dL — ABNORMAL HIGH (ref 0.44–1.00)
GFR, Estimated: 54 mL/min — ABNORMAL LOW (ref >60)
Glucose, Bld: 137 mg/dL — ABNORMAL HIGH (ref 70–99)
Potassium: 3.3 mmol/L — ABNORMAL LOW (ref 3.5–5.1)
Sodium: 136 mmol/L (ref 135–145)

## 2023-02-06 LAB — CBC
HCT: 39.7 % (ref 36.0–46.0)
Hemoglobin: 12.9 g/dL (ref 12.0–15.0)
MCH: 27.2 pg (ref 26.0–34.0)
MCHC: 32.5 g/dL (ref 30.0–36.0)
MCV: 83.6 fL (ref 80.0–100.0)
Platelets: 138 10*3/uL — ABNORMAL LOW (ref 150–400)
RBC: 4.75 MIL/uL (ref 3.87–5.11)
RDW: 14 % (ref 11.5–15.5)
WBC: 6.1 10*3/uL (ref 4.0–10.5)
nRBC: 0 % (ref 0.0–0.2)

## 2023-02-06 LAB — ECHOCARDIOGRAM COMPLETE
Area-P 1/2: 2.95 cm2
S' Lateral: 3.1 cm

## 2023-02-06 LAB — PHOSPHORUS: Phosphorus: 2.9 mg/dL (ref 2.5–4.6)

## 2023-02-06 LAB — CK: Total CK: 57 U/L (ref 38–234)

## 2023-02-06 LAB — TROPONIN I (HIGH SENSITIVITY)
Troponin I (High Sensitivity): 21 ng/L — ABNORMAL HIGH (ref <18)
Troponin I (High Sensitivity): 18 ng/L — ABNORMAL HIGH (ref <18)

## 2023-02-06 LAB — MAGNESIUM: Magnesium: 1.8 mg/dL (ref 1.7–2.4)

## 2023-02-06 MED ORDER — OXYCODONE HCL 5 MG PO TABS
5.0000 mg | ORAL_TABLET | Freq: Four times a day (QID) | ORAL | Status: DC | PRN
  Administered 2023-02-06: 5 mg via ORAL
  Filled 2023-02-06: qty 1

## 2023-02-06 MED ORDER — COLCHICINE 0.6 MG PO TABS
0.6000 mg | ORAL_TABLET | Freq: Every day | ORAL | Status: DC
  Administered 2023-02-06 – 2023-02-07 (×2): 0.6 mg via ORAL
  Filled 2023-02-06 (×2): qty 1

## 2023-02-06 MED ORDER — POTASSIUM CHLORIDE CRYS ER 20 MEQ PO TBCR
20.0000 meq | EXTENDED_RELEASE_TABLET | Freq: Every day | ORAL | Status: DC
  Administered 2023-02-07: 20 meq via ORAL
  Filled 2023-02-06: qty 1

## 2023-02-06 MED ORDER — POTASSIUM CHLORIDE CRYS ER 20 MEQ PO TBCR
40.0000 meq | EXTENDED_RELEASE_TABLET | Freq: Once | ORAL | Status: AC
  Administered 2023-02-06: 40 meq via ORAL
  Filled 2023-02-06: qty 2

## 2023-02-06 MED ORDER — ONDANSETRON HCL 4 MG PO TABS: 4.0000 mg | ORAL_TABLET | Freq: Four times a day (QID) | ORAL | Status: DC | PRN

## 2023-02-06 MED ORDER — KETOROLAC TROMETHAMINE 15 MG/ML IJ SOLN
15.0000 mg | Freq: Once | INTRAMUSCULAR | Status: AC
  Administered 2023-02-06: 15 mg via INTRAVENOUS
  Filled 2023-02-06: qty 1

## 2023-02-06 MED ORDER — ASPIRIN 81 MG PO TBEC
81.0000 mg | DELAYED_RELEASE_TABLET | Freq: Every day | ORAL | Status: DC
  Administered 2023-02-06 – 2023-02-07 (×2): 81 mg via ORAL
  Filled 2023-02-06 (×2): qty 1

## 2023-02-06 MED ORDER — TRIAMTERENE-HCTZ 37.5-25 MG PO TABS
1.0000 | ORAL_TABLET | Freq: Every day | ORAL | Status: DC
  Administered 2023-02-06: 1 via ORAL
  Filled 2023-02-06: qty 1

## 2023-02-06 MED ORDER — ONDANSETRON HCL 4 MG/2ML IJ SOLN: 4.0000 mg | Freq: Four times a day (QID) | INTRAMUSCULAR | Status: DC | PRN

## 2023-02-06 MED ORDER — ACETAMINOPHEN 650 MG RE SUPP: 650.0000 mg | Freq: Four times a day (QID) | RECTAL | Status: DC | PRN

## 2023-02-06 MED ORDER — MAGNESIUM OXIDE -MG SUPPLEMENT 400 (240 MG) MG PO TABS
400.0000 mg | ORAL_TABLET | Freq: Every day | ORAL | Status: DC
  Administered 2023-02-06 – 2023-02-07 (×2): 400 mg via ORAL
  Filled 2023-02-06 (×2): qty 1

## 2023-02-06 MED ORDER — ENOXAPARIN SODIUM 40 MG/0.4ML IJ SOSY
40.0000 mg | PREFILLED_SYRINGE | INTRAMUSCULAR | Status: DC
  Filled 2023-02-06 (×2): qty 0.4

## 2023-02-06 MED ORDER — SODIUM CHLORIDE 0.9 % IV BOLUS (SEPSIS)
1000.0000 mL | Freq: Once | INTRAVENOUS | Status: AC
  Administered 2023-02-06: 1000 mL via INTRAVENOUS

## 2023-02-06 MED ORDER — SPIRONOLACTONE 25 MG PO TABS
25.0000 mg | ORAL_TABLET | Freq: Every day | ORAL | Status: DC
  Administered 2023-02-06 – 2023-02-07 (×2): 25 mg via ORAL
  Filled 2023-02-06 (×2): qty 1

## 2023-02-06 MED ORDER — ACETAMINOPHEN 325 MG PO TABS
650.0000 mg | ORAL_TABLET | Freq: Four times a day (QID) | ORAL | Status: DC | PRN
  Administered 2023-02-06: 650 mg via ORAL
  Filled 2023-02-06: qty 2

## 2023-02-06 MED ORDER — ALLOPURINOL 100 MG PO TABS
100.0000 mg | ORAL_TABLET | Freq: Every day | ORAL | Status: DC
  Administered 2023-02-07: 100 mg via ORAL
  Filled 2023-02-06 (×2): qty 1

## 2023-02-06 NOTE — Progress Notes (Signed)
MRI attempted. Pt too claustrophobic, medication orders needed before 2nd attempt.

## 2023-02-06 NOTE — Progress Notes (Signed)
RN spoke with Dr. Marlowe Sax, informing her that patient refused MRI scan. Also informed that this RN spoke with patient upon her arrival back to unit, attempted to convince patient to have scan completed, and offered to try to get PRN medication for anxiety during scan. Patient continued to refuse. Will continue to encourage patient to complete ordered scan.
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
## 2023-02-06 NOTE — ED Notes (Signed)
ED TO INPATIENT HANDOFF REPORT  ED Nurse Name and Phone #: Altha Harm [phone]  S Name/Age/Gender Vickie Patel 52 y.o. female Room/Bed: 043C/043C  Code Status   Code Status: Full Code  Home/SNF/Other Home Patient oriented to: self, place, time, and situation Is this baseline? Yes   Triage Complete: Triage complete  Chief Complaint Chest pain [R07.9]  Triage Note Patient reports left chest pain radiating to left arm with SOB onset 2 days ago , no emesis or diaphoresis . Afib on EMS monitor .    Allergies Allergies  Allergen Reactions   Penicillins Hives    Childhood reaction unknown    Level of Care/Admitting Diagnosis ED Disposition     ED Disposition  Admit   Condition  --   [place]: [facility] [[id number]]  Level of Care: Telemetry Cardiac [103]  May place patient in observation at [facility] or [facility] if equivalent level of care is available:: Yes  Covid Evaluation: Asymptomatic - no recent exposure (last 10 days) testing not required  Diagnosis: Chest pain [id number]  Admitting Physician: Dory Horn [id number]  Attending Physician: Ripley Fraise [phone]          B Medical/Surgery History Past Medical History:  Diagnosis Date   Anemia    Blood transfusion without reported diagnosis    Constipation, chronic    Enlarged heart    Hypertension    Uterine fibroid    Past Surgical History:  Procedure Laterality Date   TUBAL LIGATION       A IV Location/Drains/Wounds Patient Lines/Drains/Airways Status     Active Line/Drains/Airways     Name Placement date Placement time Site Days   Peripheral IV 02/06/23 18 G Left Antecubital 02/06/23  0149  Antecubital  less than 1            Intake/Output Last 24 hours  Intake/Output Summary (Last 24 hours) at 02/06/2023 1328 Last data filed at 02/06/2023 0449 Gross per 24 hour  Intake 1000 ml  Output --  Net 1000 ml    Labs/Imaging Results for orders  placed or performed during the hospital encounter of 02/06/23 (from the past 48 hour(s))  Basic metabolic panel     Status: Abnormal   Collection Time: 02/06/23  1:55 AM  Result Value Ref Range   Sodium 136 135 - 145 mmol/L   Potassium 3.3 (L) 3.5 - 5.1 mmol/L   Chloride 101 98 - 111 mmol/L   CO2 25 22 - 32 mmol/L   Glucose, Bld 137 (H) 70 - 99 mg/dL    Comment: Glucose reference range applies only to samples taken after fasting for at least 8 hours.   BUN 18 6 - 20 mg/dL   Creatinine, Ser 1.20 (H) 0.44 - 1.00 mg/dL   Calcium 8.8 (L) 8.9 - 10.3 mg/dL   GFR, Estimated 54 (L) >60 mL/min    Comment: (NOTE) Calculated using the CKD-EPI Creatinine Equation (2021)    Anion gap 10 5 - 15    Comment: Performed at [facility] [address] [place], [place] [address]  Troponin I (High Sensitivity)     Status: Abnormal   Collection Time: 02/06/23  1:55 AM  Result Value Ref Range   Troponin I (High Sensitivity) 21 (H) <18 ng/L    Comment: (NOTE) Elevated high sensitivity troponin I (hsTnI) values and significant  changes across serial measurements may suggest ACS but many other  chronic and acute conditions are known to  elevate hsTnI results.  Refer to the "Links" section for chest pain algorithms and additional  guidance. Performed at [facility], [place] [address], [place], [place] [address]   CBC     Status: Abnormal   Collection Time: 02/06/23  1:55 AM  Result Value Ref Range   WBC 6.1 4.0 - 10.5 K/uL   RBC 4.75 3.87 - 5.11 MIL/uL   Hemoglobin 12.9 12.0 - 15.0 g/dL   HCT 39.7 36.0 - 46.0 %   MCV 83.6 80.0 - 100.0 fL   MCH 27.2 26.0 - 34.0 pg   MCHC 32.5 30.0 - 36.0 g/dL   RDW 14.0 11.5 - 15.5 %   Platelets 138 (L) 150 - 400 K/uL   nRBC 0.0 0.0 - 0.2 %    Comment: Performed at [facility], [place] [address] [place], [place] [address]  Magnesium     Status: None   Collection Time: 02/06/23  1:55 AM  Result Value Ref Range   Magnesium 1.8 1.7 - 2.4 mg/dL     Comment: Performed at [facility] [address] [place], [place] [address]  Troponin I (High Sensitivity)     Status: Abnormal   Collection Time: 02/06/23  4:00 AM  Result Value Ref Range   Troponin I (High Sensitivity) 18 (H) <18 ng/L    Comment: (NOTE) Elevated high sensitivity troponin I (hsTnI) values and significant  changes across serial measurements may suggest ACS but many other  chronic and acute conditions are known to elevate hsTnI results.  Refer to the "Links" section for chest pain algorithms and additional  guidance. Performed at [facility], [place] [address], [place], [place] [address]   CK     Status: None   Collection Time: 02/06/23  4:00 AM  Result Value Ref Range   Total CK 57 38 - 234 U/L    Comment: Performed at [facility], [place] [address] [place], [place] [address]  Hepatic function panel     Status: Abnormal   Collection Time: 02/06/23  4:00 AM  Result Value Ref Range   Total Protein 6.9 6.5 - 8.1 g/dL   Albumin 2.8 (L) 3.5 - 5.0 g/dL   AST 19 15 - 41 U/L   ALT 18 0 - 44 U/L   Alkaline Phosphatase 52 38 - 126 U/L   Total Bilirubin 0.9 0.3 - 1.2 mg/dL   Bilirubin, Direct 0.2 0.0 - 0.2 mg/dL   Indirect Bilirubin 0.7 0.3 - 0.9 mg/dL    Comment: Performed at [facility] [address] [place], [place] [address]  Phosphorus     Status: None   Collection Time: 02/06/23  4:00 AM  Result Value Ref Range   Phosphorus 2.9 2.5 - 4.6 mg/dL    Comment: Performed at [facility] [address] [place], [place] [address]   ECHOCARDIOGRAM COMPLETE  Result Date: 02/06/2023    ECHOCARDIOGRAM REPORT   Patient Name:   Vickie Patel [facility] Date of Exam: 02/06/2023 Medical Rec #:  [id number]       Height:       65.0 in Accession #:    [id number]      Weight:       320.0 lb Date of Birth:  May 26, 1971       BSA:          2.415 m Patient Age:    64 years  BP:           145/86 mmHg Patient Gender: F               HR:           65 bpm. Exam  Location:  Inpatient Procedure: 2D Echo, Cardiac Doppler and Color Doppler Indications:    Chest pain  History:        Patient has no prior history of Echocardiogram examinations.                 Risk Factors:Hypertension.  Sonographer:    Meagan Baucom RDCS, FE, PE Referring Phys: [place]  1. Left ventricular ejection fraction, by estimation, is 60 to 65%. The left ventricle has normal function. The left ventricle has no regional wall motion abnormalities. There is mild left ventricular hypertrophy. Left ventricular diastolic parameters are consistent with Grade I diastolic dysfunction (impaired relaxation).  2. Right ventricular systolic function is normal. The right ventricular size is normal.  3. Left atrial size was moderately dilated.  4. Cannot r/o PFO.  5. The mitral valve is abnormal. Trivial mitral valve regurgitation. No evidence of mitral stenosis.  6. The aortic valve is tricuspid. Aortic valve regurgitation is not visualized. No aortic stenosis is present.  7. The inferior vena cava is normal in size with greater than 50% respiratory variability, suggesting right atrial pressure of 3 mmHg. FINDINGS  Left Ventricle: Left ventricular ejection fraction, by estimation, is 60 to 65%. The left ventricle has normal function. The left ventricle has no regional wall motion abnormalities. The left ventricular internal cavity size was normal in size. There is  mild left ventricular hypertrophy. Left ventricular diastolic parameters are consistent with Grade I diastolic dysfunction (impaired relaxation). Right Ventricle: The right ventricular size is normal. No increase in right ventricular wall thickness. Right ventricular systolic function is normal. Left Atrium: Left atrial size was moderately dilated. Right Atrium: Right atrial size was normal in size. Pericardium: There is no evidence of pericardial effusion. Mitral Valve: The mitral valve is abnormal. There is mild thickening of the  mitral valve leaflet(s). There is mild calcification of the mitral valve leaflet(s). Mild mitral annular calcification. Trivial mitral valve regurgitation. No evidence of mitral valve stenosis. Tricuspid Valve: The tricuspid valve is normal in structure. Tricuspid valve regurgitation is not demonstrated. No evidence of tricuspid stenosis. Aortic Valve: The aortic valve is tricuspid. Aortic valve regurgitation is not visualized. No aortic stenosis is present. Pulmonic Valve: The pulmonic valve was normal in structure. Pulmonic valve regurgitation is not visualized. No evidence of pulmonic stenosis. Aorta: The aortic root is normal in size and structure. Venous: The inferior vena cava is normal in size with greater than 50% respiratory variability, suggesting right atrial pressure of 3 mmHg. IAS/Shunts: The interatrial septum was not well visualized.  LEFT VENTRICLE PLAX 2D LVIDd:         4.40 cm   Diastology LVIDs:         3.10 cm   LV e' medial:    3.81 cm/s LV PW:         1.30 cm   LV E/e' medial:  17.7 LV IVS:        1.30 cm   LV e' lateral:   5.55 cm/s LVOT diam:     2.60 cm   LV E/e' lateral: 12.1 LV SV:         115 LV SV Index:   47 LVOT Area:  5.31 cm  RIGHT VENTRICLE RV S prime:     14.30 cm/s TAPSE (M-mode): 2.5 cm LEFT ATRIUM             Index        RIGHT ATRIUM           Index LA diam:        4.30 cm 1.78 cm/m   RA Area:     19.80 cm LA Vol (A2C):   87.2 ml 36.10 ml/m  RA Volume:   49.00 ml  20.29 ml/m LA Vol (A4C):   78.7 ml 32.58 ml/m LA Biplane Vol: 88.5 ml 36.64 ml/m  AORTIC VALVE LVOT Vmax:   102.00 cm/s LVOT Vmean:  81.000 cm/s LVOT VTI:    0.216 m  AORTA Ao Root diam: 3.30 cm Ao Asc diam:  3.20 cm MITRAL VALVE MV Area (PHT): 2.95 cm    SHUNTS MV Decel Time: 257 msec    Systemic VTI:  0.22 m MV E velocity: 67.30 cm/s  Systemic Diam: 2.60 cm MV A velocity: 75.40 cm/s MV E/A ratio:  0.89 Jenkins Rouge MD Electronically signed by Jenkins Rouge MD Signature Date/Time: 02/06/2023/11:42:24 AM     Final    DG Chest Portable 1 View  Result Date: 02/06/2023 CLINICAL DATA:  Cp rt shoulder pain EXAM: PORTABLE CHEST 1 VIEW COMPARISON:  Chest x-ray 02/16/2016 FINDINGS: Stable mild cardiomegaly. Some component likely due to AP portable technique. The heart and mediastinal contours are unchanged. Low lung volumes. No focal consolidation. No pulmonary edema. No pleural effusion. No pneumothorax. No acute osseous abnormality. IMPRESSION: Low lung volumes.  No active disease. Electronically Signed   By: Iven Finn M.D.   On: 02/06/2023 02:11    Pending Labs Unresulted Labs (From admission, onward)     Start     Ordered   02/13/23 0500  Creatinine, serum  (enoxaparin (LOVENOX)    CrCl >/= 30 ml/min)  Weekly,   R     Comments: while on enoxaparin therapy    02/06/23 0719   02/07/23 0500  CBC  Tomorrow morning,   R        02/06/23 0719   02/07/23 0500  Comprehensive metabolic panel  Tomorrow morning,   R        02/06/23 0719            Vitals/Pain Today's Vitals   02/06/23 0830 02/06/23 0937 02/06/23 1150 02/06/23 1200  BP:   (!) 154/73 (!) 152/72  Pulse: 61  66 64  Resp: (!) 23  16 (!) 22  Temp:  98.3 F (36.8 C)    TempSrc:      SpO2: 100%  100% 100%  PainSc:        Isolation Precautions No active isolations  Medications Medications  enoxaparin (LOVENOX) injection 40 mg (40 mg Subcutaneous Patient Refused/Not Given 02/06/23 1013)  acetaminophen (TYLENOL) tablet 650 mg (650 mg Oral Given 02/06/23 1234)    Or  acetaminophen (TYLENOL) suppository 650 mg ( Rectal See Alternative 02/06/23 1234)  ondansetron (ZOFRAN) tablet 4 mg (has no administration in time range)    Or  ondansetron (ZOFRAN) injection 4 mg (has no administration in time range)  aspirin EC tablet 81 mg (81 mg Oral Given 02/06/23 1011)  allopurinol (ZYLOPRIM) tablet 100 mg (100 mg Oral Patient Refused/Not Given 02/06/23 1011)  colchicine tablet 0.6 mg (0.6 mg Oral Given 02/06/23 1011)  oxyCODONE (Oxy  IR/ROXICODONE) immediate release tablet 5 mg (5 mg Oral Given  02/06/23 1235)  potassium chloride SA (KLOR-CON M) CR tablet 20 mEq (has no administration in time range)  magnesium oxide (MAG-OX) tablet 400 mg (400 mg Oral Given 02/06/23 1234)  spironolactone (ALDACTONE) tablet 25 mg (25 mg Oral Given 02/06/23 1234)  ketorolac (TORADOL) 15 MG/ML injection 15 mg (15 mg Intravenous Given 02/06/23 0421)  sodium chloride 0.9 % bolus 1,000 mL (0 mLs Intravenous Stopped 02/06/23 0449)  potassium chloride SA (KLOR-CON M) CR tablet 40 mEq (40 mEq Oral Given 02/06/23 1010)    Mobility walks     Focused Assessments Cardiac Assessment Handoff:  Cardiac Rhythm: Atrial fibrillation Lab Results  Component Value Date   CKTOTAL 57 02/06/2023   TROPONINI <0.30 07/09/2014   No results found for: "DDIMER" Does the Patient currently have chest pain? No    R Recommendations: See Admitting Provider Note  Report given to:   Additional Notes: Pt denies chest pain but c/o leg pain

## 2023-02-06 NOTE — ED Notes (Signed)
Pt going to echo

## 2023-02-06 NOTE — ED Notes (Signed)
Pt is denying chest pain and is eager to be discharged.  Pt refused Lovenox.  Hospitalist made aware.

## 2023-02-06 NOTE — ED Triage Notes (Addendum)
Patient reports left chest pain radiating to left arm with SOB onset 2 days ago , no emesis or diaphoresis . Afib on EMS monitor .

## 2023-02-06 NOTE — ED Notes (Signed)
Attempted to collect cbg, pt refused stating she did not want to be stuck again

## 2023-02-07 ENCOUNTER — Other Ambulatory Visit (HOSPITAL_COMMUNITY): Payer: Self-pay

## 2023-02-07 ENCOUNTER — Observation Stay (HOSPITAL_COMMUNITY): Payer: 59

## 2023-02-07 DIAGNOSIS — R079 Chest pain, unspecified: Secondary | ICD-10-CM | POA: Diagnosis not present

## 2023-02-07 DIAGNOSIS — M5126 Other intervertebral disc displacement, lumbar region: Secondary | ICD-10-CM | POA: Diagnosis not present

## 2023-02-07 DIAGNOSIS — I10 Essential (primary) hypertension: Secondary | ICD-10-CM | POA: Diagnosis not present

## 2023-02-07 DIAGNOSIS — I517 Cardiomegaly: Secondary | ICD-10-CM | POA: Diagnosis not present

## 2023-02-07 DIAGNOSIS — R0789 Other chest pain: Secondary | ICD-10-CM | POA: Diagnosis not present

## 2023-02-07 DIAGNOSIS — R072 Precordial pain: Secondary | ICD-10-CM | POA: Diagnosis not present

## 2023-02-07 DIAGNOSIS — M48061 Spinal stenosis, lumbar region without neurogenic claudication: Secondary | ICD-10-CM | POA: Diagnosis not present

## 2023-02-07 MED ORDER — SPIRONOLACTONE 25 MG PO TABS
25.0000 mg | ORAL_TABLET | Freq: Every day | ORAL | 0 refills | Status: AC
  Filled 2023-02-07: qty 30, 30d supply, fill #0

## 2023-02-07 MED ORDER — LORAZEPAM 2 MG/ML IJ SOLN
1.0000 mg | Freq: Once | INTRAMUSCULAR | Status: AC | PRN
  Administered 2023-02-07: 1 mg via INTRAVENOUS
  Filled 2023-02-07: qty 1

## 2023-02-07 MED ORDER — METHYLPREDNISOLONE 4 MG PO TBPK: ORAL_TABLET | ORAL | 0 refills | Status: DC

## 2023-02-07 MED ORDER — METHYLPREDNISOLONE 4 MG PO TBPK
ORAL_TABLET | ORAL | 0 refills | Status: AC
  Filled 2023-02-07: qty 21, 6d supply, fill #0

## 2023-02-07 MED ORDER — METHYLPREDNISOLONE SODIUM SUCC 125 MG IJ SOLR
80.0000 mg | Freq: Once | INTRAMUSCULAR | Status: AC
  Administered 2023-02-07: 80 mg via INTRAVENOUS
  Filled 2023-02-07: qty 2

## 2023-02-07 MED ORDER — SPIRONOLACTONE 25 MG PO TABS: 25.0000 mg | ORAL_TABLET | Freq: Every day | ORAL | 0 refills | Status: DC

## 2023-02-07 NOTE — Progress Notes (Signed)
PT Cancellation Note  Patient Details Name: Vickie Patel MRN: [id number] DOB: Mar 08, 1971   Cancelled Treatment:    Reason Eval/Treat Not Completed: Patient at procedure or test/unavailable   Viann Shove 02/07/2023, 9:29 AM

## 2023-02-07 NOTE — Consult Note (Signed)
   [facility] CM Inpatient Consult   02/07/2023  Vickie Patel 1971/08/01 [id number]  [place] Organization [ACO] Patient: [employer]   Primary Care Provider:  Dorna Mai, MD [place] [facility]  Patient screened for re-hospitalization in 30 days to assess for potential [facility] service needs for post hospital transition for care coordination due to inpatient [facility] RNCM notes of patient declined [facility] due to co-pays noted.  Met with patient and gentleman at the bedside to follow up on potential needs. Explained the reason for visit.  Patient denies any additional needs as she plans to change providers and insurance and states, "I'm not going to give up.".  Thanked the patient for her time, no other needs assessed.   Of note, [facility] Care Management/Population Health does not replace or interfere with any arrangements made by the Inpatient Transition of Care team.  For questions contact:   Natividad Brood, RN BSN [place]  [phone] business mobile phone Toll free office [phone]  *[place]  [phone] Fax number: [phone] [email or web address]

## 2023-02-07 NOTE — Progress Notes (Addendum)
1445: Volunteer employee sent patient back to unit due to having issues with getting in car but she was already discharged from unit. Patient assisted to car safely with steady. Patient also seen getting up from wheelchair to steady with minimal issues. Family (sons) upset that she is being discharged to home, but patient declines SNF at this time.
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
## 2023-02-07 NOTE — TOC Progression Note (Addendum)
Transition of Care [facility]) - Progression Note    Patient Details  Name: Vickie Patel MRN: [id number] Date of Birth: [birth year]  [facility] CM/SW Contact  Zenon Mayo, RN Phone Number: 02/07/2023, 1:09 PM  Clinical Narrative:    Patient is for dc today, NCM was notified that she refused to go to SNF,  NCM offered choice for [facility],  she does not have a preference.  NCM Made rerferral to [facility] with [facility] for [place], [place], she is able to take referral.  Patient states if she has a co payment that will be around 80.00 she does not want to do the [facility].  Claiborne Billings will check what the copayment will be and call this NCM back. [name] ( mother) was listed as legal guardian and [name] her son as a contact, NCM asked patient about this she states this is old information and and her husband is the only one we need to talk to.   Per Claiborne Billings with [facility] she states each visit will be copay of 112.00 for patient . Patient does not want to pay 112.00 so says she does not want HH.          Expected Discharge Plan and Services         Expected Discharge Date: 02/07/23                                     Social Determinants of Health (SDOH) Interventions SDOH Screenings   Food Insecurity: No Food Insecurity (01/25/2023)  Housing: Low Risk  (01/25/2023)  Transportation Needs: No Transportation Needs (01/25/2023)  Utilities: Not At Risk (01/25/2023)  Depression (PHQ2-9): Low Risk  (02/08/2022)  Tobacco Use: High Risk (02/06/2023)    Readmission Risk Interventions     No data to display
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
## 2023-02-07 NOTE — Progress Notes (Signed)
Dr. Marlowe Sax paged to inform of patient refusing morning labs.

## 2023-02-07 NOTE — Progress Notes (Signed)
OT Cancellation Note  Patient Details Name: ANALAYA MILKO MRN: [id number] DOB: 03-30-1971   Cancelled Treatment:    Reason Eval/Treat Not Completed: Patient at procedure or test/ unavailable. Will check back as time/schedule allows.  Percell Miller Beth Dixon, OTR/L 02/07/2023, 10:09 AM
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
## 2023-02-07 NOTE — Progress Notes (Signed)
Pt being d/c, VSS, IV removed, education complete.   Alvis Lemmings, RN 02/07/2023 2:03 PM
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
## 2023-02-09 ENCOUNTER — Other Ambulatory Visit: Payer: Self-pay

## 2023-02-09 NOTE — Progress Notes (Signed)
Patient outreached by Junius Finner, PharmD Candidate on 02/09/2023 to discuss hypertension.   Patient does not have an automated home blood pressure machine. They would be interested in obtaining one, but are currently unable to afford it at this time.   Medication review was performed. They are taking medications as prescribed. She is confused about her medications, but was unwilling to speak about it at this time.   The following barriers to adherence were noted:  - They do have cost concerns.  - They do have transportation concerns.  - They do not need assistance obtaining refills.  - They do not occasionally forget to take some of their prescribed medications.  - They do not feel like one/some of their medications make them feel poorly.  - They do not have questions or concerns about their medications.  - They do not have follow up scheduled with their primary care provider/cardiologist.   The following interventions were completed:  - Medications were reviewed   The patient does not have follow up scheduled   Left phone number with patient for any questions or concerns: [phone].   [place] [facility]  PharmD Candidate 2024   Maryan Puls, PharmD PGY-1 [facility] Pharmacy Resident

## 2023-02-17 DIAGNOSIS — Z419 Encounter for procedure for purposes other than remedying health state, unspecified: Secondary | ICD-10-CM | POA: Diagnosis not present

## 2023-03-01 DIAGNOSIS — M1712 Unilateral primary osteoarthritis, left knee: Secondary | ICD-10-CM | POA: Diagnosis not present

## 2023-03-01 DIAGNOSIS — R030 Elevated blood-pressure reading, without diagnosis of hypertension: Secondary | ICD-10-CM | POA: Diagnosis not present

## 2023-03-01 DIAGNOSIS — Z79899 Other long term (current) drug therapy: Secondary | ICD-10-CM | POA: Diagnosis not present

## 2023-03-01 DIAGNOSIS — R7309 Other abnormal glucose: Secondary | ICD-10-CM | POA: Diagnosis not present

## 2023-03-01 DIAGNOSIS — E876 Hypokalemia: Secondary | ICD-10-CM | POA: Diagnosis not present

## 2023-03-01 DIAGNOSIS — Z6841 Body Mass Index (BMI) 40.0 and over, adult: Secondary | ICD-10-CM | POA: Diagnosis not present

## 2023-03-08 ENCOUNTER — Other Ambulatory Visit: Payer: Self-pay

## 2023-03-14 ENCOUNTER — Other Ambulatory Visit: Payer: Self-pay

## 2023-03-20 DIAGNOSIS — Z419 Encounter for procedure for purposes other than remedying health state, unspecified: Secondary | ICD-10-CM | POA: Diagnosis not present

## 2023-04-03 ENCOUNTER — Encounter: Payer: Self-pay | Admitting: *Deleted

## 2023-04-03 DIAGNOSIS — I10 Essential (primary) hypertension: Secondary | ICD-10-CM | POA: Diagnosis not present

## 2023-04-03 DIAGNOSIS — E876 Hypokalemia: Secondary | ICD-10-CM | POA: Diagnosis not present

## 2023-04-03 DIAGNOSIS — Z6841 Body Mass Index (BMI) 40.0 and over, adult: Secondary | ICD-10-CM | POA: Diagnosis not present

## 2023-04-03 DIAGNOSIS — N1831 Chronic kidney disease, stage 3a: Secondary | ICD-10-CM | POA: Diagnosis not present

## 2023-04-03 DIAGNOSIS — R030 Elevated blood-pressure reading, without diagnosis of hypertension: Secondary | ICD-10-CM | POA: Diagnosis not present

## 2023-04-03 DIAGNOSIS — R7309 Other abnormal glucose: Secondary | ICD-10-CM | POA: Diagnosis not present

## 2023-04-03 DIAGNOSIS — M1712 Unilateral primary osteoarthritis, left knee: Secondary | ICD-10-CM | POA: Diagnosis not present

## 2023-04-19 DIAGNOSIS — Z419 Encounter for procedure for purposes other than remedying health state, unspecified: Secondary | ICD-10-CM | POA: Diagnosis not present

## 2023-05-03 DIAGNOSIS — K5900 Constipation, unspecified: Secondary | ICD-10-CM | POA: Diagnosis not present

## 2023-05-03 DIAGNOSIS — R7309 Other abnormal glucose: Secondary | ICD-10-CM | POA: Diagnosis not present

## 2023-05-03 DIAGNOSIS — I10 Essential (primary) hypertension: Secondary | ICD-10-CM | POA: Diagnosis not present

## 2023-05-03 DIAGNOSIS — E559 Vitamin D deficiency, unspecified: Secondary | ICD-10-CM | POA: Diagnosis not present

## 2023-05-03 DIAGNOSIS — Z6841 Body Mass Index (BMI) 40.0 and over, adult: Secondary | ICD-10-CM | POA: Diagnosis not present

## 2023-05-03 DIAGNOSIS — M1712 Unilateral primary osteoarthritis, left knee: Secondary | ICD-10-CM | POA: Diagnosis not present

## 2023-05-03 DIAGNOSIS — R030 Elevated blood-pressure reading, without diagnosis of hypertension: Secondary | ICD-10-CM | POA: Diagnosis not present

## 2023-05-03 DIAGNOSIS — E876 Hypokalemia: Secondary | ICD-10-CM | POA: Diagnosis not present

## 2023-05-03 DIAGNOSIS — N1831 Chronic kidney disease, stage 3a: Secondary | ICD-10-CM | POA: Diagnosis not present

## 2023-05-20 DIAGNOSIS — Z419 Encounter for procedure for purposes other than remedying health state, unspecified: Secondary | ICD-10-CM | POA: Diagnosis not present

## 2023-06-12 ENCOUNTER — Other Ambulatory Visit: Payer: Self-pay

## 2023-06-12 ENCOUNTER — Emergency Department (HOSPITAL_BASED_OUTPATIENT_CLINIC_OR_DEPARTMENT_OTHER)
Admission: EM | Admit: 2023-06-12 | Discharge: 2023-06-13 | Disposition: A | Discharge status: Home / Self Care | Payer: Medicaid Other | Attending: Emergency Medicine | Admitting: Emergency Medicine

## 2023-06-12 ENCOUNTER — Encounter (HOSPITAL_BASED_OUTPATIENT_CLINIC_OR_DEPARTMENT_OTHER): Payer: Self-pay

## 2023-06-12 ENCOUNTER — Emergency Department (HOSPITAL_BASED_OUTPATIENT_CLINIC_OR_DEPARTMENT_OTHER): Payer: Medicaid Other

## 2023-06-12 DIAGNOSIS — M1000 Idiopathic gout, unspecified site: Secondary | ICD-10-CM

## 2023-06-12 DIAGNOSIS — I10 Essential (primary) hypertension: Secondary | ICD-10-CM | POA: Diagnosis not present

## 2023-06-12 DIAGNOSIS — R079 Chest pain, unspecified: Secondary | ICD-10-CM | POA: Diagnosis not present

## 2023-06-12 DIAGNOSIS — M2550 Pain in unspecified joint: Secondary | ICD-10-CM | POA: Insufficient documentation

## 2023-06-12 DIAGNOSIS — R001 Bradycardia, unspecified: Secondary | ICD-10-CM | POA: Diagnosis not present

## 2023-06-12 DIAGNOSIS — I110 Hypertensive heart disease with heart failure: Secondary | ICD-10-CM | POA: Diagnosis not present

## 2023-06-12 DIAGNOSIS — Z743 Need for continuous supervision: Secondary | ICD-10-CM | POA: Diagnosis not present

## 2023-06-12 DIAGNOSIS — I509 Heart failure, unspecified: Secondary | ICD-10-CM | POA: Insufficient documentation

## 2023-06-12 DIAGNOSIS — R0789 Other chest pain: Secondary | ICD-10-CM | POA: Insufficient documentation

## 2023-06-12 LAB — BASIC METABOLIC PANEL
Anion gap: 9 (ref 5–15)
BUN: 19 mg/dL (ref 6–20)
CO2: 24 mmol/L (ref 22–32)
Calcium: 8.9 mg/dL (ref 8.9–10.3)
Chloride: 106 mmol/L (ref 98–111)
Creatinine, Ser: 1.06 mg/dL — ABNORMAL HIGH (ref 0.44–1.00)
GFR, Estimated: >60 mL/min (ref >60)
Glucose, Bld: 94 mg/dL (ref 70–99)
Potassium: 4 mmol/L (ref 3.5–5.1)
Sodium: 139 mmol/L (ref 135–145)

## 2023-06-12 LAB — BRAIN NATRIURETIC PEPTIDE: B Natriuretic Peptide: 612.9 pg/mL — ABNORMAL HIGH (ref 0.0–100.0)

## 2023-06-12 LAB — TROPONIN I (HIGH SENSITIVITY)
Troponin I (High Sensitivity): 17 ng/L (ref <18)
Troponin I (High Sensitivity): 17 ng/L (ref <18)

## 2023-06-12 LAB — HEPATIC FUNCTION PANEL
ALT: 8 U/L (ref 0–44)
AST: 11 U/L — ABNORMAL LOW (ref 15–41)
Albumin: 3.7 g/dL (ref 3.5–5.0)
Alkaline Phosphatase: 45 U/L (ref 38–126)
Bilirubin, Direct: 0.3 mg/dL — ABNORMAL HIGH (ref 0.0–0.2)
Indirect Bilirubin: 0.8 mg/dL (ref 0.3–0.9)
Total Bilirubin: 1.1 mg/dL (ref 0.3–1.2)
Total Protein: 7.2 g/dL (ref 6.5–8.1)

## 2023-06-12 LAB — CBC
HCT: 40.5 % (ref 36.0–46.0)
Hemoglobin: 12.8 g/dL (ref 12.0–15.0)
MCH: 25.9 pg — ABNORMAL LOW (ref 26.0–34.0)
MCHC: 31.6 g/dL (ref 30.0–36.0)
MCV: 82 fL (ref 80.0–100.0)
Platelets: 123 10*3/uL — ABNORMAL LOW (ref 150–400)
RBC: 4.94 MIL/uL (ref 3.87–5.11)
RDW: 14.6 % (ref 11.5–15.5)
WBC: 6 10*3/uL (ref 4.0–10.5)
nRBC: 0 % (ref 0.0–0.2)

## 2023-06-12 LAB — PROTIME-INR
INR: 1.2 (ref 0.8–1.2)
Prothrombin Time: 15.5 seconds — ABNORMAL HIGH (ref 11.4–15.2)

## 2023-06-12 MED ORDER — PREDNISONE 10 MG PO TABS
20.0000 mg | ORAL_TABLET | Freq: Every day | ORAL | 0 refills | Status: DC
  Filled 2023-06-12: qty 10, 5d supply, fill #0

## 2023-06-12 MED ORDER — OXYCODONE-ACETAMINOPHEN 5-325 MG PO TABS
2.0000 | ORAL_TABLET | Freq: Once | ORAL | Status: AC
  Administered 2023-06-12: 2 via ORAL
  Filled 2023-06-12: qty 2

## 2023-06-12 MED ORDER — KETOROLAC TROMETHAMINE 15 MG/ML IJ SOLN
15.0000 mg | Freq: Once | INTRAMUSCULAR | Status: AC
  Administered 2023-06-12: 15 mg via INTRAVENOUS
  Filled 2023-06-12: qty 1

## 2023-06-12 MED ORDER — PREDNISONE 10 MG PO TABS: 20.0000 mg | ORAL_TABLET | Freq: Every day | ORAL | 0 refills | Status: AC

## 2023-06-12 MED ORDER — ALLOPURINOL 100 MG PO TABS
100.0000 mg | ORAL_TABLET | Freq: Every day | ORAL | 3 refills | Status: DC
  Filled 2023-06-12: qty 30, 30d supply, fill #0

## 2023-06-12 MED ORDER — MORPHINE SULFATE (PF) 4 MG/ML IV SOLN
6.0000 mg | Freq: Once | INTRAVENOUS | Status: AC
  Administered 2023-06-12: 6 mg via INTRAVENOUS
  Filled 2023-06-12: qty 2

## 2023-06-12 MED ORDER — ALLOPURINOL 100 MG PO TABS: 100.0000 mg | ORAL_TABLET | Freq: Every day | ORAL | 3 refills | Status: AC

## 2023-06-12 NOTE — ED Notes (Signed)
[facility] called for transport. ETA 8-9pm.-ABB(NS)

## 2023-06-12 NOTE — ED Notes (Addendum)
Pt c/o needing to use the restroom. Demanded a Purewick. NT spoke with patient about our policy and criteria and pt began cussing at staff. Second staff member spoke with patient and she was adamant about only using Purewick. Pt stated she would not use BSC, or either bedpan. Spoke with AD just to confirm it was okay, due to new policy and criteria. Pt also wanting medication for gout. Informed patient that PA was messaged and said would "be right there". Family at bedside.

## 2023-06-12 NOTE — Discharge Instructions (Addendum)
Follow up with your primary care provider.    I recommend taking Tylenol 1000 mg every 6 hours in addition to the oxycodone as this should help with some of your pain and is a very safe medication.  Instead of colchicine I recommend ibuprofen 600 mg every 6 hours.  I given you a dose of Toradol here which is similar to ibuprofen I recommend that you take the next dose of ibuprofen before bedtime.  May always return to emergency room for any new or concerning symptoms.

## 2023-06-12 NOTE — ED Triage Notes (Signed)
Patient BIB [facility] from Home.  Notes CP that began at 0430 today. No Discernable SOB. VSS with EMS but Bradycardic (40-60). No EMS Interventions.  NAD Noted during Triage. A&Ox4. GCS 15. BIB Stretcher.
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
## 2023-06-12 NOTE — ED Provider Notes (Signed)
[place] EMERGENCY DEPARTMENT AT [facility] Provider Note   CSN: [id number] Arrival date & time: 06/12/23  1416     History  Chief Complaint  Patient presents with   Chest Pain    Reginna JOMARA VOLKOV is a 52 y.o. female.   Chest Pain  Patient is a 52 year old female with a past medical history significant for chronic pain, HTN, CHF/hypertensive cardiomyopathy, obesity, gout  Patient presents emergency room today with complaints of chest pain since approximately 4:30 AM in the morning.  She states that she is not having significant chest pain at this time.  She states that she has having diffuse body pain she states that her back hurts which is a chronic issue for her.  She takes 3 times daily Percocet for this.  She has not taken any Percocet today.  She denies any significant dyspnea although she does state that she gets short of breath if she tries to move around too much however this has been true for over a year.  She denies any unilateral leg swelling, denies any recent surgeries or travel.  No history of VTE.  She was diagnosed with leg weakness and gait impairment due to bulging disc at L5/1.  She does not move very much.  She is effectively wheelchair-bound.  She states that she is in the process of losing weight however.  Denies any fevers chills cough congestion lightheadedness or dizziness.    Home Medications Prior to Admission medications   Medication Sig Start Date End Date Taking? Authorizing Provider  acetaminophen (TYLENOL) 500 MG tablet Take 1 tablet (500 mg total) by mouth every 6 (six) hours as needed. Patient taking differently: Take 1,000 mg by mouth every 6 (six) hours as needed for moderate pain. 11/02/21   Grayce Sessions, NP  allopurinol (ZYLOPRIM) 100 MG tablet Take 1 tablet (100 mg total) by mouth daily. 06/12/23   Gailen Shelter, PA  aspirin EC 81 MG tablet Take 1 tablet by mouth daily.    [provider]  colchicine 0.6 MG  tablet Take 1 tablet (0.6 mg total) by mouth daily. 07/02/22   Georganna Skeans, MD  methylPREDNISolone (MEDROL DOSEPAK) 4 MG TBPK tablet Take taper per dose pack instructions 02/07/23   Tyrone Nine, MD  oxyCODONE (OXY IR/ROXICODONE) 5 MG immediate release tablet Take 5 mg by mouth 3 (three) times daily. 12/24/22   [provider]  predniSONE (DELTASONE) 10 MG tablet Take 2 tablets (20 mg total) by mouth daily for 5 days. 06/12/23 06/17/23  Gailen Shelter, PA  spironolactone (ALDACTONE) 25 MG tablet Take 1 tablet (25 mg total) by mouth daily. 02/08/23   Tyrone Nine, MD      Allergies    Penicillins    Review of Systems   Review of Systems  Cardiovascular:  Positive for chest pain.    Physical Exam Updated Vital Signs BP 121/88 (BP Location: Right Arm)   Pulse 64   Temp 98 F (36.7 C) (Oral)   Resp 18   Ht 5' 5.5" (1.664 m)   Wt (!) 145.5 kg   LMP 06/12/2019   SpO2 99%   BMI 52.57 kg/m  Physical Exam Vitals and nursing note reviewed.  Constitutional:      General: She is not in acute distress.    Appearance: She is obese.     Comments: Pleasant, chronically unwell appearing 52 year old female in no acute distress  HENT:     Head: Normocephalic and atraumatic.  Nose: Nose normal.     Mouth/Throat:     Mouth: Mucous membranes are moist.  Eyes:     General: No scleral icterus. Cardiovascular:     Rate and Rhythm: Normal rate and regular rhythm.     Pulses: Normal pulses.     Heart sounds: Normal heart sounds.  Pulmonary:     Effort: Pulmonary effort is normal. No respiratory distress.     Breath sounds: No wheezing.  Chest:     Chest wall: Tenderness present.  Abdominal:     Palpations: Abdomen is soft.     Tenderness: There is no abdominal tenderness. There is no guarding or rebound.  Musculoskeletal:     Cervical back: Normal range of motion.     Right lower leg: No edema.     Left lower leg: No edema.     Comments: Hesitancy to range left ankle however  is able to do so.  No redness or swelling of this joint  Skin:    General: Skin is warm and dry.     Capillary Refill: Capillary refill takes less than 2 seconds.  Neurological:     Mental Status: She is alert. Mental status is at baseline.     Comments: Smile symmetric, sensation symmetric in all 4 extremities, poor effort with neurologic exam but symmetric diffuse weakness  Psychiatric:        Mood and Affect: Mood normal.        Behavior: Behavior normal.     ED Results / Procedures / Treatments   Labs (all labs ordered are listed, but only abnormal results are displayed) Labs Reviewed  BASIC METABOLIC PANEL - Abnormal; Notable for the following components:      Result Value   Creatinine, Ser 1.06 (*)    All other components within normal limits  CBC - Abnormal; Notable for the following components:   MCH 25.9 (*)    Platelets 123 (*)    All other components within normal limits  HEPATIC FUNCTION PANEL - Abnormal; Notable for the following components:   AST 11 (*)    Bilirubin, Direct 0.3 (*)    All other components within normal limits  BRAIN NATRIURETIC PEPTIDE - Abnormal; Notable for the following components:   B Natriuretic Peptide 612.9 (*)    All other components within normal limits  PROTIME-INR - Abnormal; Notable for the following components:   Prothrombin Time 15.5 (*)    All other components within normal limits  TROPONIN I (HIGH SENSITIVITY)  TROPONIN I (HIGH SENSITIVITY)    EKG EKG Interpretation Date/Time:  Monday June 12 2023 14:35:22 EDT Ventricular Rate:  55 PR Interval:  197 QRS Duration:  97 QT Interval:  439 QTC Calculation: 420 R Axis:   82  Text Interpretation: Sinus rhythm Normal ECG Confirmed by Gwyneth Sprout ([id number]) on 06/12/2023 5:18:34 PM  Radiology No results found.  Procedures Procedures    Medications Ordered in ED Medications  morphine (PF) 4 MG/ML injection 6 mg (6 mg Intravenous Given 06/12/23 1634)  ketorolac  (TORADOL) 15 MG/ML injection 15 mg (15 mg Intravenous Given 06/12/23 1633)  oxyCODONE-acetaminophen (PERCOCET/ROXICET) 5-325 MG per tablet 2 tablet (2 tablets Oral Given 06/12/23 2221)  methocarbamol (ROBAXIN) tablet 500 mg (500 mg Oral Given 06/13/23 0047)    ED Course/ Medical Decision Making/ A&P                             Medical  Decision Making Amount and/or Complexity of Data Reviewed Labs: ordered. Radiology: ordered.  Risk Prescription drug management.   This patient presents to the ED for concern of CP, this involves a number of treatment options, and is a complaint that carries with it a moderate to high risk of complications and morbidity. A differential diagnosis was considered for the patient's symptoms which is discussed below:   The emergent causes of chest pain include: Acute coronary syndrome, tamponade, pericarditis/myocarditis, aortic dissection, pulmonary embolism, tension pneumothorax, pneumonia, and esophageal rupture.   I do not believe the patient has an emergent cause of chest pain, other urgent/non-acute considerations include, but are not limited to: chronic angina, aortic stenosis, cardiomyopathy, mitral valve prolapse, pulmonary hypertension, aortic insufficiency, right ventricular hypertrophy, pleuritis, bronchitis, pneumothorax, tumor, gastroesophageal reflux disease (GERD), esophageal spasm, Mallory-Weiss syndrome, peptic ulcer disease, pancreatitis, functional gastrointestinal pain, cervical or thoracic disk disease or arthritis, shoulder arthritis, costochondritis, subacromial bursitis, anxiety or panic attack, herpes zoster, breast disorders, chest wall tumors, thoracic outlet syndrome, mediastinitis.    Co morbidities: Discussed in HPI   Brief History:  Patient is a 52 year old female with a past medical history significant for chronic pain, HTN, CHF/hypertensive cardiomyopathy, obesity, gout  Patient presents emergency room today with complaints of  chest pain since approximately 4:30 AM in the morning.  She states that she is not having significant chest pain at this time.  She states that she has having diffuse body pain she states that her back hurts which is a chronic issue for her.  She takes 3 times daily Percocet for this.  She has not taken any Percocet today.  She denies any significant dyspnea although she does state that she gets short of breath if she tries to move around too much however this has been true for over a year.  She denies any unilateral leg swelling, denies any recent surgeries or travel.  No history of VTE.  She was diagnosed with leg weakness and gait impairment due to bulging disc at L5/1.  She does not move very much.  She is effectively wheelchair-bound.  She states that she is in the process of losing weight however.  Denies any fevers chills cough congestion lightheadedness or dizziness.    EMR reviewed including pt PMHx, past surgical history and past visits to ER.   See HPI for more details   Lab Tests:   I ordered and independently interpreted labs. Labs notable for BNP elevated at 612 patient does have history of hypertensive heart disease.  BMP unremarkable coags approximately normal, troponins normal, LFTs unremarkable, CBC without leukocytosis or anemia  Imaging Studies:  Chest x-ray shows cardiomegaly but no evidence of pulmonary vascular congestion.    Cardiac Monitoring:  The patient was maintained on a cardiac monitor.  I personally viewed and interpreted the cardiac monitored which showed an underlying rhythm of: NSR EKG non-ischemic   Medicines ordered:  I ordered medication including Robaxin, Percocet, morphine, Toradol for pain Reevaluation of the patient after these medicines showed that the patient improved I have reviewed the patients home medicines and have made adjustments as needed   Critical Interventions:     Consults/Attending Physician   I discussed this  case with my attending physician who cosigned this note including patient's presenting symptoms, physical exam, and planned diagnostics and interventions. Attending physician stated agreement with plan or made changes to plan which were implemented.   Reevaluation:  After the interventions noted above I re-evaluated patient and found that they  have :improved   Social Determinants of Health:      Problem List / ED Course:  Patient is present emergency room today with complaints of multiple areas of pain.  Initially triaged as chest pain however states she seems to be having quite a bit of low back pain and generalized body pain.  She follows with pain management and takes 3 times daily Percocet.  She also is indicating that her left ankle feels that she is having a gout flare.  I do not appreciate any redness and she is able to move the ankle joint but does seem uncomfortable when she is doing so.  I doubt septic arthritis and will treat with some prednisone and refill her allopurinol.  She is requesting colchicine and I recommended ibuprofen.   Dispostion:  After consideration of the diagnostic results and the patients response to treatment, I feel that the patent would benefit from discharge with close outpatient follow-up.   Final Clinical Impression(s) / ED Diagnoses Final diagnoses:  Atypical chest pain  Arthralgia, unspecified joint    Rx / DC Orders ED Discharge Orders          Ordered    allopurinol (ZYLOPRIM) 100 MG tablet  Daily,   Status:  Discontinued        06/12/23 1624    predniSONE (DELTASONE) 10 MG tablet  Daily,   Status:  Discontinued        06/12/23 1731    allopurinol (ZYLOPRIM) 100 MG tablet  Daily        06/12/23 1854    predniSONE (DELTASONE) 10 MG tablet  Daily        06/12/23 1854              Gailen Shelter, [place] 06/15/23 1237    Gwyneth Sprout, MD 06/16/23 1759

## 2023-06-13 ENCOUNTER — Other Ambulatory Visit: Payer: Self-pay

## 2023-06-13 DIAGNOSIS — Z7689 Persons encountering health services in other specified circumstances: Secondary | ICD-10-CM | POA: Diagnosis not present

## 2023-06-13 MED ORDER — METHOCARBAMOL 500 MG PO TABS
500.0000 mg | ORAL_TABLET | Freq: Once | ORAL | Status: AC
  Administered 2023-06-13: 500 mg via ORAL
  Filled 2023-06-13: qty 1
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
## 2023-06-13 NOTE — ED Notes (Signed)
-  Called EMS Non-Emergency for update on patient transportation, EMS said [facility] did not fax a list of patients to be transported so added patient to transportation list and advise they would relay to EMS that we have been waiting since 530pm for transportation.

## 2023-06-13 NOTE — ED Notes (Signed)
Went in to assist patient to see if she could get up and out to the car, pt was unable to do so, pt wanted to wait for [facility] arrival.

## 2023-06-13 NOTE — ED Notes (Addendum)
-  Called [facility] for update, advised 6 others ahead of patient.

## 2023-06-13 NOTE — ED Notes (Signed)
-  Called EMS Non-Emergency for update on patient transportation, EMS said [facility] did not fax a list of patients to be transported so added patient to transportation list and advise they would relay to EMS that we have been waiting since 530pm for transportation. 

## 2023-06-14 ENCOUNTER — Other Ambulatory Visit: Payer: Self-pay

## 2023-06-17 DIAGNOSIS — I10 Essential (primary) hypertension: Secondary | ICD-10-CM | POA: Diagnosis not present

## 2023-06-17 DIAGNOSIS — Z79899 Other long term (current) drug therapy: Secondary | ICD-10-CM | POA: Diagnosis not present

## 2023-06-17 DIAGNOSIS — M1712 Unilateral primary osteoarthritis, left knee: Secondary | ICD-10-CM | POA: Diagnosis not present

## 2023-06-19 ENCOUNTER — Other Ambulatory Visit: Payer: Self-pay

## 2023-06-19 DIAGNOSIS — Z419 Encounter for procedure for purposes other than remedying health state, unspecified: Secondary | ICD-10-CM | POA: Diagnosis not present

## 2023-06-20 DIAGNOSIS — M545 Low back pain, unspecified: Secondary | ICD-10-CM | POA: Diagnosis not present

## 2023-06-20 DIAGNOSIS — R350 Frequency of micturition: Secondary | ICD-10-CM | POA: Diagnosis not present

## 2023-06-20 DIAGNOSIS — R300 Dysuria: Secondary | ICD-10-CM | POA: Diagnosis not present

## 2023-06-20 DIAGNOSIS — N3000 Acute cystitis without hematuria: Secondary | ICD-10-CM | POA: Diagnosis not present

## 2023-07-20 DIAGNOSIS — N62 Hypertrophy of breast: Secondary | ICD-10-CM | POA: Diagnosis not present

## 2023-07-20 DIAGNOSIS — M1712 Unilateral primary osteoarthritis, left knee: Secondary | ICD-10-CM | POA: Diagnosis not present

## 2023-07-20 DIAGNOSIS — I10 Essential (primary) hypertension: Secondary | ICD-10-CM | POA: Diagnosis not present

## 2023-07-20 DIAGNOSIS — Z419 Encounter for procedure for purposes other than remedying health state, unspecified: Secondary | ICD-10-CM | POA: Diagnosis not present

## 2023-08-18 ENCOUNTER — Other Ambulatory Visit: Payer: Self-pay

## 2023-08-20 DIAGNOSIS — Z419 Encounter for procedure for purposes other than remedying health state, unspecified: Secondary | ICD-10-CM | POA: Diagnosis not present

## 2023-08-27 DIAGNOSIS — Z6841 Body Mass Index (BMI) 40.0 and over, adult: Secondary | ICD-10-CM | POA: Diagnosis not present

## 2023-08-27 DIAGNOSIS — R030 Elevated blood-pressure reading, without diagnosis of hypertension: Secondary | ICD-10-CM | POA: Diagnosis not present

## 2023-08-27 DIAGNOSIS — Z760 Encounter for issue of repeat prescription: Secondary | ICD-10-CM | POA: Diagnosis not present

## 2023-08-27 DIAGNOSIS — M1712 Unilateral primary osteoarthritis, left knee: Secondary | ICD-10-CM | POA: Diagnosis not present

## 2023-09-19 DIAGNOSIS — Z419 Encounter for procedure for purposes other than remedying health state, unspecified: Secondary | ICD-10-CM | POA: Diagnosis not present

## 2023-09-23 DIAGNOSIS — Z6841 Body Mass Index (BMI) 40.0 and over, adult: Secondary | ICD-10-CM | POA: Diagnosis not present

## 2023-09-23 DIAGNOSIS — R030 Elevated blood-pressure reading, without diagnosis of hypertension: Secondary | ICD-10-CM | POA: Diagnosis not present

## 2023-09-23 DIAGNOSIS — Z79899 Other long term (current) drug therapy: Secondary | ICD-10-CM | POA: Diagnosis not present

## 2023-09-23 DIAGNOSIS — M109 Gout, unspecified: Secondary | ICD-10-CM | POA: Diagnosis not present

## 2023-09-23 DIAGNOSIS — M1712 Unilateral primary osteoarthritis, left knee: Secondary | ICD-10-CM | POA: Diagnosis not present

## 2023-10-20 DIAGNOSIS — Z419 Encounter for procedure for purposes other than remedying health state, unspecified: Secondary | ICD-10-CM | POA: Diagnosis not present

## 2023-10-21 DIAGNOSIS — M1712 Unilateral primary osteoarthritis, left knee: Secondary | ICD-10-CM | POA: Diagnosis not present

## 2023-10-21 DIAGNOSIS — Z6841 Body Mass Index (BMI) 40.0 and over, adult: Secondary | ICD-10-CM | POA: Diagnosis not present

## 2023-10-21 DIAGNOSIS — R030 Elevated blood-pressure reading, without diagnosis of hypertension: Secondary | ICD-10-CM | POA: Diagnosis not present

## 2023-10-21 DIAGNOSIS — R300 Dysuria: Secondary | ICD-10-CM | POA: Diagnosis not present

## 2023-10-21 DIAGNOSIS — Z79899 Other long term (current) drug therapy: Secondary | ICD-10-CM | POA: Diagnosis not present

## 2023-11-19 DIAGNOSIS — Z419 Encounter for procedure for purposes other than remedying health state, unspecified: Secondary | ICD-10-CM | POA: Diagnosis not present

## 2023-11-21 DIAGNOSIS — Z6841 Body Mass Index (BMI) 40.0 and over, adult: Secondary | ICD-10-CM | POA: Diagnosis not present

## 2023-11-21 DIAGNOSIS — R030 Elevated blood-pressure reading, without diagnosis of hypertension: Secondary | ICD-10-CM | POA: Diagnosis not present

## 2023-11-21 DIAGNOSIS — M1712 Unilateral primary osteoarthritis, left knee: Secondary | ICD-10-CM | POA: Diagnosis not present

## 2023-11-22 ENCOUNTER — Ambulatory Visit: Payer: No Typology Code available for payment source | Admitting: Physical Therapy

## 2023-11-22 NOTE — Therapy (Unsigned)
OUTPATIENT PHYSICAL THERAPY LOWER EXTREMITY EVALUATION  Patient Name: Vickie Patel MRN: [id number] DOB:1971-01-04, 52 y.o., female Today's Date: 11/22/2023    Past Medical History:  Diagnosis Date   Anemia    Blood transfusion without reported diagnosis    Constipation, chronic    Enlarged heart    Hypertension    Uterine fibroid    Past Surgical History:  Procedure Laterality Date   TUBAL LIGATION     Patient Active Problem List   Diagnosis Date Noted   Chest pain 02/06/2023   Hypokalemia 01/25/2023   Gout 01/25/2023   Chronic pain disorder 01/25/2023   Essential hypertension 08/06/2015   Hypertensive heart disease 08/06/2015   Morbid obesity (HCC) 08/06/2015   Cardiac murmur 08/06/2015    PCP: Georganna Skeans, MD  REFERRING PROVIDER: Carmel Sacramento, NP  THERAPY DIAG:  No diagnosis found.  REFERRING DIAG: Hip pain  Rationale for Evaluation and Treatment:  Rehabilitation  SUBJECTIVE:  PERTINENT PAST HISTORY:  ***        PRECAUTIONS: {Therapy precautions:24002}  WEIGHT BEARING RESTRICTIONS {Yes ***/No:24003}  FALLS:  Has patient fallen in last 6 months? {yes/no:20286}, Number of falls: ***  MOI/History of condition:  Onset date: ***  SUBJECTIVE STATEMENT  Vickie Patel is a 52 y.o. female who presents to clinic with chief complaint of ***.  ***   Red flags:  {has/denies:26543} {kerredflag:26542}  Pain:  Are you having pain? {yes/no:20286} Pain location: *** NPRS scale:  {NUMBERS; 0-10:5044}/10 to {NUMBERS; 0-10:5044}/10 Aggravating factors: *** Relieving factors: *** Pain description: {PAIN DESCRIPTION:21022940} Stage: {Desc; acute/subacute/chronic:13799} 24 hour pattern: ***   Occupation: ***  Assistive Device: ***  Hand Dominance: ***  Patient Goals/Specific Activities: ***   OBJECTIVE:   DIAGNOSTIC FINDINGS:  ***   GENERAL OBSERVATION/GAIT:   ***  SENSATION:  Light touch:  {intact/deficits:24005}  PALPATION: ***  MUSCLE LENGTH: Hamstrings: Right {kerminsig:27227} restriction; Left {kerminsig:27227} restriction ASLR: Right {keraslr:27228}; Left {keraslr:27228} Maisie Fus test: Right {kerminsig:27227} restriction; Left {kerminsig:27227} restriction Ely's test: Right {kerminsig:27227} restriction; Left {kerminsig:27227} restriction  LE MMT:  MMT Right (Eval) Left (Eval)  Hip flexion (L2, L3) *** ***  Knee extension (L3) *** ***  Knee flexion *** ***  Hip abduction *** ***  Hip extension *** ***  Hip external rotation    Hip internal rotation    Hip adduction    Ankle dorsiflexion (L4)    Ankle plantarflexion (S1)    Ankle inversion    Ankle eversion    Great Toe ext (L5)    Grossly     (Blank rows = not tested, score listed is out of 5 possible points.  N = WNL, D = diminished, C = clear for gross weakness with myotome testing, * = concordant pain with testing)  LE ROM:  ROM Right (Eval) Left (Eval)  Hip flexion    Hip extension    Hip abduction    Hip adduction    Hip internal rotation    Hip external rotation    Knee extension    Knee flexion    Ankle dorsiflexion    Ankle plantarflexion    Ankle inversion    Ankle eversion     (Blank rows = not tested, N = WNL, * = concordant pain with testing)  Functional Tests  Eval  SPECIAL TESTS:   ***   PATIENT SURVEYS:  FOTO ***   TODAY'S TREATMENT: ***   PATIENT EDUCATION:  POC, diagnosis, prognosis, HEP, and outcome measures.  Pt educated via explanation, demonstration, and handout (HEP).  Pt confirms understanding verbally.   HOME EXERCISE PROGRAM: ***  Treatment priorities   Eval                                                  ASSESSMENT:  CLINICAL IMPRESSION: Vickie Patel is a 52 y.o. female who presents to clinic with signs and sxs consistent with ***.    OBJECTIVE IMPAIRMENTS: Pain,  ***  ACTIVITY LIMITATIONS: ***  PERSONAL FACTORS: See medical history and pertinent history   REHAB POTENTIAL: {rehabpotential:25112}  CLINICAL DECISION MAKING: {clinical decision making:25114}  EVALUATION COMPLEXITY: {Evaluation complexity:25115}   GOALS:   SHORT TERM GOALS: Target date: ***  Vickie Patel will be >75% HEP compliant to improve carryover between sessions and facilitate independent management of condition  Evaluation: ongoing Goal status: INITIAL   LONG TERM GOALS: Target date: ***  Vickie Patel will improve FOTO score to *** as a proxy for functional improvement  Evaluation/Baseline: *** Goal status: INITIAL    2.  Vickie Patel will self report >/= 50% decrease in pain from evaluation to improve function in daily tasks  Evaluation/Baseline: ***/10 max pain Goal status: INITIAL   3.  ***   4.  ***   5.  ***   6.  ***   PLAN: PT FREQUENCY: 1-2x/week  PT DURATION: 8 weeks  PLANNED INTERVENTIONS:  97164- PT Re-evaluation, 97110-Therapeutic exercises, 97530- Therapeutic activity, [id number]- Neuromuscular re-education, 97535- Self Care, 16109- Manual therapy, [id number]- Gait training, [id number]- Aquatic Therapy, [id number]- Electrical stimulation (manual), [id number]- Vasopneumatic device, [id number]- Traction (mechanical), [id number]- Ionotophoresis 4mg /ml Dexamethasone, Taping, Dry Needling, Joint manipulation, and Spinal manipulation.   Alphonzo Severance PT, DPT 11/22/2023, 8:33 AM  I just finished a MCD eval/recert.  Name: CONNAR ROTHENBERGER  MRN: [id number] {kermcd1:25763}  Check all conditions that are expected to impact treatment: {Conditions expected to impact treatment:28273}   Check all possible CPT codes: 19147- Therapeutic Exercise, [phone]- Neuro Re-education, [phone] - Gait Training, [phone] - Manual Therapy, [phone] - Therapeutic Activities, [phone] - Self Care, [phone] - Re-evaluation, [id number] - Mechanical traction, and [phone] - Aquatic therapy   Thank you!  MCD - Secure

## 2023-12-11 NOTE — Therapy (Unsigned)
OUTPATIENT PHYSICAL THERAPY LOWER EXTREMITY EVALUATION  Patient Name: Vickie Patel MRN: [id number] DOB:Sep 18, 1971, 52 y.o., female Today's Date: 12/11/2023    Past Medical History:  Diagnosis Date   Anemia    Blood transfusion without reported diagnosis    Constipation, chronic    Enlarged heart    Hypertension    Uterine fibroid    Past Surgical History:  Procedure Laterality Date   TUBAL LIGATION     Patient Active Problem List   Diagnosis Date Noted   Chest pain 02/06/2023   Hypokalemia 01/25/2023   Gout 01/25/2023   Chronic pain disorder 01/25/2023   Essential hypertension 08/06/2015   Hypertensive heart disease 08/06/2015   Morbid obesity (HCC) 08/06/2015   Cardiac murmur 08/06/2015    PCP: Georganna Skeans, MD  REFERRING PROVIDER: Carmel Sacramento, NP  THERAPY DIAG:  No diagnosis found.  REFERRING DIAG: Hip pain  Rationale for Evaluation and Treatment:  Rehabilitation  SUBJECTIVE:  PERTINENT PAST HISTORY:  ***        PRECAUTIONS: {Therapy precautions:24002}  WEIGHT BEARING RESTRICTIONS {Yes ***/No:24003}  FALLS:  Has patient fallen in last 6 months? {yes/no:20286}, Number of falls: ***  MOI/History of condition:  Onset date: ***  SUBJECTIVE STATEMENT  Vickie Patel is a 52 y.o. female who presents to clinic with chief complaint of ***.  ***   Red flags:  {has/denies:26543} {kerredflag:26542}  Pain:  Are you having pain? {yes/no:20286} Pain location: *** NPRS scale:  {NUMBERS; 0-10:5044}/10 to {NUMBERS; 0-10:5044}/10 Aggravating factors: *** Relieving factors: *** Pain description: {PAIN DESCRIPTION:21022940} Stage: {Desc; acute/subacute/chronic:13799} 24 hour pattern: ***   Occupation: ***  Assistive Device: ***  Hand Dominance: ***  Patient Goals/Specific Activities: ***   OBJECTIVE:   DIAGNOSTIC FINDINGS:  ***   GENERAL OBSERVATION/GAIT:   ***  SENSATION:  Light touch:  {intact/deficits:24005}  PALPATION: ***  MUSCLE LENGTH: Hamstrings: Right {kerminsig:27227} restriction; Left {kerminsig:27227} restriction ASLR: Right {keraslr:27228}; Left {keraslr:27228} Maisie Fus test: Right {kerminsig:27227} restriction; Left {kerminsig:27227} restriction Ely's test: Right {kerminsig:27227} restriction; Left {kerminsig:27227} restriction  LE MMT:  MMT Right (Eval) Left (Eval)  Hip flexion (L2, L3) *** ***  Knee extension (L3) *** ***  Knee flexion *** ***  Hip abduction *** ***  Hip extension *** ***  Hip external rotation    Hip internal rotation    Hip adduction    Ankle dorsiflexion (L4)    Ankle plantarflexion (S1)    Ankle inversion    Ankle eversion    Great Toe ext (L5)    Grossly     (Blank rows = not tested, score listed is out of 5 possible points.  N = WNL, D = diminished, C = clear for gross weakness with myotome testing, * = concordant pain with testing)  LE ROM:  ROM Right (Eval) Left (Eval)  Hip flexion    Hip extension    Hip abduction    Hip adduction    Hip internal rotation    Hip external rotation    Knee extension    Knee flexion    Ankle dorsiflexion    Ankle plantarflexion    Ankle inversion    Ankle eversion     (Blank rows = not tested, N = WNL, * = concordant pain with testing)  Functional Tests  Eval  SPECIAL TESTS:   ***   PATIENT SURVEYS:  FOTO ***   TODAY'S TREATMENT: ***   PATIENT EDUCATION:  POC, diagnosis, prognosis, HEP, and outcome measures.  Pt educated via explanation, demonstration, and handout (HEP).  Pt confirms understanding verbally.   HOME EXERCISE PROGRAM: ***  Treatment priorities   Eval                                                  ASSESSMENT:  CLINICAL IMPRESSION: Vickie Patel is a 52 y.o. female who presents to clinic with signs and sxs consistent with ***.    OBJECTIVE IMPAIRMENTS: Pain,  ***  ACTIVITY LIMITATIONS: ***  PERSONAL FACTORS: See medical history and pertinent history   REHAB POTENTIAL: {rehabpotential:25112}  CLINICAL DECISION MAKING: {clinical decision making:25114}  EVALUATION COMPLEXITY: {Evaluation complexity:25115}   GOALS:   SHORT TERM GOALS: Target date: ***  Vickie Patel will be >75% HEP compliant to improve carryover between sessions and facilitate independent management of condition  Evaluation: ongoing Goal status: INITIAL   LONG TERM GOALS: Target date: ***  Vickie Patel will improve FOTO score to *** as a proxy for functional improvement  Evaluation/Baseline: *** Goal status: INITIAL    2.  Vickie Patel will self report >/= 50% decrease in pain from evaluation to improve function in daily tasks  Evaluation/Baseline: ***/10 max pain Goal status: INITIAL   3.  ***   4.  ***   5.  ***   6.  ***   PLAN: PT FREQUENCY: 1-2x/week  PT DURATION: 8 weeks  PLANNED INTERVENTIONS:  97164- PT Re-evaluation, 97110-Therapeutic exercises, 97530- Therapeutic activity, [id number]- Neuromuscular re-education, 97535- Self Care, 16109- Manual therapy, [id number]- Gait training, U009502- Aquatic Therapy, [id number]- Electrical stimulation (manual), [id number]- Vasopneumatic device, [id number]- Traction (mechanical), [id number]- Ionotophoresis 4mg /ml Dexamethasone, Taping, Dry Needling, Joint manipulation, and Spinal manipulation.   Alphonzo Severance PT, DPT 12/11/2023, 8:22 AM  I just finished a MCD eval/recert.  Name: Vickie Patel  MRN: [id number] {kermcd1:25763}  Check all conditions that are expected to impact treatment: {Conditions expected to impact treatment:28273}   Check all possible CPT codes: 19147- Therapeutic Exercise, [phone]- Neuro Re-education, [phone] - Gait Training, [phone] - Manual Therapy, [phone] - Therapeutic Activities, [phone] - Self Care, [phone] - Re-evaluation, [id number] - Mechanical traction, and [phone] - Aquatic therapy   Thank you!  MCD - Secure

## 2023-12-12 ENCOUNTER — Ambulatory Visit: Payer: Medicaid Other | Attending: Nurse Practitioner | Admitting: Physical Therapy

## 2023-12-12 NOTE — Therapy (Deleted)
OUTPATIENT PHYSICAL THERAPY LOWER EXTREMITY EVALUATION   Patient Name: Vickie Patel MRN: [id number] DOB:03-05-1971, 52 y.o., female Today's Date: 12/12/2023  END OF SESSION:   Past Medical History:  Diagnosis Date   Anemia    Blood transfusion without reported diagnosis    Constipation, chronic    Enlarged heart    Hypertension    Uterine fibroid    Past Surgical History:  Procedure Laterality Date   TUBAL LIGATION     Patient Active Problem List   Diagnosis Date Noted   Chest pain 02/06/2023   Hypokalemia 01/25/2023   Gout 01/25/2023   Chronic pain disorder 01/25/2023   Essential hypertension 08/06/2015   Hypertensive heart disease 08/06/2015   Morbid obesity (HCC) 08/06/2015   Cardiac murmur 08/06/2015    PCP: Georganna Skeans MD  REFERRING PROVIDER: Carmel Sacramento  REFERRING DIAG: Knee pain   THERAPY DIAG:  No diagnosis found.  Rationale for Evaluation and Treatment: Rehabilitation  ONSET DATE: ***  SUBJECTIVE:   SUBJECTIVE STATEMENT: ***  PERTINENT HISTORY: L knee pain, L hip pain, BP, chronic pain, OA, HTN PAIN:  Are you having pain? Yes: NPRS scale: *** Pain location: *** Pain description: *** Aggravating factors: walking, bending, squatting Relieving factors: ***  PRECAUTIONS: None  RED FLAGS: {PT Red Flags:29287}   WEIGHT BEARING RESTRICTIONS: No  FALLS:  Has patient fallen in last 6 months? {fallsyesno:27318}  LIVING ENVIRONMENT: Lives with: {OPRC lives with:25569::"lives with their family"} Lives in: {Lives in:25570} Stairs: {opstairs:27293} Has following equipment at home: {Assistive devices:23999}  OCCUPATION: ***  PLOF: {PLOF:24004}  PATIENT GOALS: ***  NEXT MD VISIT: ***  OBJECTIVE:  Note: Objective measures were completed at Evaluation unless otherwise noted.  DIAGNOSTIC FINDINGS:  XR Mod OA in medial and PF compartment   PATIENT SURVEYS:  FOTO ***  COGNITION: Overall cognitive status: Within functional  limits for tasks assessed     SENSATION: WFL  EDEMA:  {edema:24020}  MUSCLE LENGTH: Hamstrings: Right *** deg; Left *** deg Maisie Fus test: Right *** deg; Left *** deg  POSTURE: {posture:25561}  PALPATION: ***  LOWER EXTREMITY ROM:  {AROM/PROM:27142} ROM Right eval Left eval  Hip flexion    Hip extension    Hip abduction    Hip adduction    Hip internal rotation    Hip external rotation    Knee flexion    Knee extension    Ankle dorsiflexion    Ankle plantarflexion    Ankle inversion    Ankle eversion     (Blank rows = not tested)  LOWER EXTREMITY MMT:  MMT Right eval Left eval  Hip flexion    Hip extension    Hip abduction    Hip adduction    Hip internal rotation    Hip external rotation    Knee flexion    Knee extension    Ankle dorsiflexion    Ankle plantarflexion    Ankle inversion    Ankle eversion     (Blank rows = not tested)  LOWER EXTREMITY SPECIAL TESTS:  {LEspecialtests:26242}  FUNCTIONAL TESTS:  {Functional tests:24029}  GAIT: Distance walked: *** Assistive device utilized: {Assistive devices:23999} Level of assistance: {Levels of assistance:24026} Comments: ***  TREATMENT DATE: 12/12/23  [facility] Adult PT Treatment:                                                DATE: 12/12/23 Therapeutic Exercise: *** Manual Therapy: *** Neuromuscular re-ed: *** Therapeutic Activity: *** Modalities: *** Self Care: ***   PATIENT EDUCATION:  Education details: *** Person educated: {Person educated:25204} Education method: {Education Method:25205} Education comprehension: {Education Comprehension:25206}  HOME EXERCISE PROGRAM: ***  ASSESSMENT:  CLINICAL IMPRESSION: Patient is a *** y.o. *** who was seen today for physical therapy evaluation and treatment for ***.   OBJECTIVE IMPAIRMENTS:  {opptimpairments:25111}.   ACTIVITY LIMITATIONS: {activitylimitations:27494}  PARTICIPATION LIMITATIONS: {participationrestrictions:25113}  PERSONAL FACTORS: {Personal factors:25162} are also affecting patient's functional outcome.   REHAB POTENTIAL: {rehabpotential:25112}  CLINICAL DECISION MAKING: {clinical decision making:25114}  EVALUATION COMPLEXITY: {Evaluation complexity:25115}   GOALS: Goals reviewed with patient? {yes/no:20286}  SHORT TERM GOALS: Target date: *** *** Baseline: Goal status: INITIAL  2.  *** Baseline:  Goal status: INITIAL  3.  *** Baseline:  Goal status: INITIAL  4.  *** Baseline:  Goal status: INITIAL  5.  *** Baseline:  Goal status: INITIAL  6.  *** Baseline:  Goal status: INITIAL  LONG TERM GOALS: Target date: ***  *** Baseline:  Goal status: INITIAL  2.  *** Baseline:  Goal status: INITIAL  3.  *** Baseline:  Goal status: INITIAL  4.  *** Baseline:  Goal status: INITIAL  5.  *** Baseline:  Goal status: INITIAL  6.  *** Baseline:  Goal status: INITIAL   PLAN:  PT FREQUENCY: {rehab frequency:25116}  PT DURATION: {rehab duration:25117}  PLANNED INTERVENTIONS: {rehab planned interventions:25118::"97110-Therapeutic exercises","97530- Therapeutic [phone]- Neuromuscular re-education","97535- Self JXBJ","47829- Manual therapy"}  PLAN FOR NEXT SESSION: ***   Cecile Guevara, PT 12/12/2023, 8:55 AM

## 2023-12-20 DIAGNOSIS — Z419 Encounter for procedure for purposes other than remedying health state, unspecified: Secondary | ICD-10-CM | POA: Diagnosis not present

## 2023-12-22 DIAGNOSIS — Z6841 Body Mass Index (BMI) 40.0 and over, adult: Secondary | ICD-10-CM | POA: Diagnosis not present

## 2023-12-22 DIAGNOSIS — R030 Elevated blood-pressure reading, without diagnosis of hypertension: Secondary | ICD-10-CM | POA: Diagnosis not present

## 2023-12-22 DIAGNOSIS — M1712 Unilateral primary osteoarthritis, left knee: Secondary | ICD-10-CM | POA: Diagnosis not present
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
## 2023-12-28 ENCOUNTER — Ambulatory Visit: Payer: No Typology Code available for payment source | Attending: Nurse Practitioner

## 2023-12-28 DIAGNOSIS — M6281 Muscle weakness (generalized): Secondary | ICD-10-CM | POA: Insufficient documentation

## 2023-12-28 DIAGNOSIS — M25562 Pain in left knee: Secondary | ICD-10-CM | POA: Diagnosis not present

## 2023-12-28 DIAGNOSIS — R2689 Other abnormalities of gait and mobility: Secondary | ICD-10-CM | POA: Insufficient documentation

## 2023-12-28 DIAGNOSIS — G8929 Other chronic pain: Secondary | ICD-10-CM | POA: Diagnosis not present

## 2023-12-29 ENCOUNTER — Other Ambulatory Visit: Payer: Self-pay

## 2023-12-29 NOTE — Addendum Note (Signed)
 Addended by: Hildred Laser on: 12/29/2023 07:57 AM   Modules accepted: Orders

## 2024-01-04 ENCOUNTER — Ambulatory Visit: Payer: Medicaid Other | Admitting: Physical Therapy

## 2024-01-04 ENCOUNTER — Encounter: Payer: Self-pay | Admitting: Physical Therapy

## 2024-01-04 DIAGNOSIS — M6281 Muscle weakness (generalized): Secondary | ICD-10-CM

## 2024-01-04 DIAGNOSIS — R2689 Other abnormalities of gait and mobility: Secondary | ICD-10-CM

## 2024-01-04 DIAGNOSIS — G8929 Other chronic pain: Secondary | ICD-10-CM | POA: Diagnosis not present

## 2024-01-04 DIAGNOSIS — M25562 Pain in left knee: Secondary | ICD-10-CM | POA: Diagnosis not present

## 2024-01-04 NOTE — Therapy (Signed)
OUTPATIENT PHYSICAL THERAPY LOWER EXTREMITY EVALUATION   Patient Name: Vickie Patel MRN: [id number] DOB:1971-02-19, 53 y.o., female Today's Date: 01/04/2024  END OF SESSION:  PT End of Session - 01/04/24 1653     Visit Number 2    Number of Visits 4    Date for PT Re-Evaluation 02/25/24    PT Start Time 1655    PT Stop Time 1740    PT Time Calculation (min) 45 min    Equipment Utilized During Treatment Gait belt    Activity Tolerance Patient tolerated treatment well;Other (comment)    Behavior During Therapy WFL for tasks assessed/performed              Past Medical History:  Diagnosis Date   Anemia    Blood transfusion without reported diagnosis    Constipation, chronic    Enlarged heart    Hypertension    Uterine fibroid    Past Surgical History:  Procedure Laterality Date   TUBAL LIGATION     Patient Active Problem List   Diagnosis Date Noted   Chest pain 02/06/2023   Hypokalemia 01/25/2023   Gout 01/25/2023   Chronic pain disorder 01/25/2023   Essential hypertension 08/06/2015   Hypertensive heart disease 08/06/2015   Morbid obesity (HCC) 08/06/2015   Cardiac murmur 08/06/2015    PCP: Georganna Skeans, MD   REFERRING PROVIDER: Carmel [place], NP  REFERRING DIAG: osteoarthritis localized primary knee left  THERAPY DIAG:  Muscle weakness (generalized)  Chronic pain of left knee  Other abnormalities of gait and mobility  Rationale for Evaluation and Treatment: Rehabilitation  ONSET DATE: chronic  SUBJECTIVE:   SUBJECTIVE STATEMENT: Pt stated that she's highly fearful to walk, but will do whatever it takes to get her life back in order. Is currently working on losing weight so she can have breast reduction surgery.  PERTINENT HISTORY: None available PAIN:  Are you having pain? Yes: NPRS scale: 0/10 at rest, 10/10 at worst   Pain location: L knee Pain description: ache Aggravating factors: undetermined Relieving factors:  rest  PRECAUTIONS: None  RED FLAGS: None   WEIGHT BEARING RESTRICTIONS: No  FALLS:  Has patient fallen in last 6 months? No  OCCUPATION: not working  PLOF: Independent  PATIENT GOALS: To walk again  NEXT MD VISIT: 01/22/24  OBJECTIVE:  Note: Objective measures were completed at Evaluation unless otherwise noted.  DIAGNOSTIC FINDINGS: none for knee  PATIENT SURVEYS:  Patient-specific activity scoring scheme (Point to one number):  "0" represents "unable to perform." "10" represents "able to perform at prior level. 0 1 2 3 4 5 6 7 8 9  10 (Date and Score) Activity Initial  Activity Eval     STS tranfers  3    Static standing  3    ambulation 0    Additional Additional Total score = sum of the activity scores/number of activities Minimum detectable change (90%CI) for average score = 2 points Minimum detectable change (90%CI) for single activity score = 3 points PSFS developed by: Jake Seats., & Binkley, J. (1995). Assessing disability and change on individual  patients: a report of a patient specific measure. Physiotherapy [place], 47, 782-956. Reproduced with the permission of the authors  Score: 6/30 20% functional  MUSCLE LENGTH: Hamstrings: restricted B, measured in sitting  POSTURE: deferred  LOWER EXTREMITY ROM:  Active ROM Right eval Left eval  Hip flexion    Hip extension    Hip abduction    Hip  adduction    Hip internal rotation    Hip external rotation    Knee flexion 100d 100d  Knee extension -10d -10d  Ankle dorsiflexion [facility] WFL  Ankle plantarflexion [facility] WFL  Ankle inversion    Ankle eversion     (Blank rows = not tested)  LOWER EXTREMITY MMT:  MMT Right eval Left eval  Hip flexion 3+ 3+  Hip extension    Hip abduction    Hip adduction    Hip internal rotation    Hip external rotation    Knee flexion 3 3  Knee extension 3 3  Ankle dorsiflexion    Ankle plantarflexion    Ankle inversion    Ankle  eversion     (Blank rows = not tested)  LOWER EXTREMITY SPECIAL TESTS:  deferred  FUNCTIONAL TESTS:  30 seconds chair stand test 1 STS transfers CGA  GAIT: Distance walked: 68ft patient self confined to [facility] Assistive device utilized: Wheelchair (manual) Level of assistance: Total A Comments: CG transports patient                                                                                                                                [facility] Adult PT Treatment:                                                DATE: 01/04/2024 Therapeutic Exercise: Seated LAQ w/ lift 2x12, #4  Seated banded Abduction, 2x12 w/3s hold, Blue Seated marching, 2x12, #4 Therapeutic Activity: Gait training in // 20' STS 2x6  12/28/23 Eval and HEP    PATIENT EDUCATION:  Education details: Discussed eval findings, rehab rationale and POC and patient is in agreement  Person educated: Patient Education method: Explanation Education comprehension: verbalized understanding and needs further education  HOME EXERCISE PROGRAM: Access Code: [email or web address] URL: [email or web address] Date: 12/28/2023 Prepared by: Gustavus Bryant  Exercises - Seated Long Arc Quad  - 3 x daily - 5 x weekly - 2 sets - 10 reps - Seated Heel Slide  - 3 x daily - 5 x weekly - 2 sets - 10 reps - Seated March  - 3 x daily - 5 x weekly - 2 sets - 10 reps - Sit to Stand with Counter Support  - 3 x daily - 5 x weekly - 1 sets - 5 reps  ASSESSMENT:  CLINICAL IMPRESSION: Pt attended physical therapy session for continuation of treatment regarding generalized lower extremity weakness, gait dysfunction, fear avoidance, and LBP. Pt tolerated treatment great and demonstrated improvement with global LE strength and functional gait. Some difficulties continued with task initiation due to fear avoidance, however responded phenomenally to genuine encouragement and reassurance that she's capable,. Pt required minimal vocal cues for safe  and appropriate performance of today's interventions.    Eval Impression (  12/28/2023):Patient is a 53 y.o. female who was seen today for physical therapy evaluation and treatment for L knee pain and decreased functional mobility.  She presents with decreased B hip and knee extension as observed in standing.  Patient requires CGA to perform STS transfer and S to remain standing.  Scope of assessment limited due to patient apprehension regarding mobility and movement.  Unable to assess ambulation with AD due to time constraints.  Rehab potential is limited due to patient apprehension regarding mobility and ambulation.   OBJECTIVE IMPAIRMENTS: Abnormal gait, decreased activity tolerance, decreased balance, decreased endurance, decreased knowledge of condition, decreased knowledge of use of DME, decreased mobility, difficulty walking, decreased strength, and pain.   ACTIVITY LIMITATIONS: carrying, lifting, standing, stairs, and transfers  PERSONAL FACTORS: Age, Fitness, and Time since onset of injury/illness/exacerbation are also affecting patient's functional outcome.   REHAB POTENTIAL: Fair due to chronicity and body habitus  CLINICAL DECISION MAKING: Stable/uncomplicated  EVALUATION COMPLEXITY: Low   GOALS: Goals reviewed with patient? No  SHORT TERM GOALS=LONG TERM GOALS: Target date: 02/25/24 Patient to demonstrate independence in HEP  Baseline: 84LWMB2A Goal status: INITIAL  2.  Patient will increase 30s chair stand reps from 1 to 4 with/without arms to demonstrate and improved functional ability with less pain/difficulty as well as reduce fall risk.  Baseline: 1 with CGA Goal status: INITIAL  3.  Patient will score at least 50% on PSFS to signify clinically meaningful improvement in functional abilities.   Baseline: 20% Goal status: INITIAL  4.  233ft ambulation with LRAD Baseline: non-ambulatory on eval Goal status: INITIAL     PLAN:  PT FREQUENCY: 1x/week  PT DURATION:  4 weeks  PLANNED INTERVENTIONS: 97164- PT Re-evaluation, 97110-Therapeutic exercises, 97530- Therapeutic activity, 97112- Neuromuscular re-education, 97535- Self Care, 01027- Manual therapy, [phone]- Gait training, Balance training, and Stair training  PLAN FOR NEXT SESSION: HEP review and update, manual techniques as appropriate, aerobic tasks, ROM and flexibility activities, strengthening and PREs, TPDN, gait and balance training as needed    Sheliah Plane, PT, DPT 01/04/2024, 5:48 PM

## 2024-01-11 ENCOUNTER — Ambulatory Visit: Payer: No Typology Code available for payment source

## 2024-01-11 DIAGNOSIS — M6281 Muscle weakness (generalized): Secondary | ICD-10-CM | POA: Diagnosis not present

## 2024-01-11 DIAGNOSIS — M25562 Pain in left knee: Secondary | ICD-10-CM | POA: Diagnosis not present

## 2024-01-11 DIAGNOSIS — G8929 Other chronic pain: Secondary | ICD-10-CM | POA: Diagnosis not present

## 2024-01-11 DIAGNOSIS — R2689 Other abnormalities of gait and mobility: Secondary | ICD-10-CM

## 2024-01-11 NOTE — Therapy (Signed)
OUTPATIENT PHYSICAL THERAPY TREATMENT NOTE   Patient Name: Vickie Patel MRN: [id number] DOB:06-06-1971, 53 y.o., female Today's Date: 01/11/2024  END OF SESSION:  PT End of Session - 01/11/24 1656     Visit Number 3    Number of Visits 4    Date for PT Re-Evaluation 02/25/24    Authorization Type medpay/MCD    PT Start Time 1700    PT Stop Time 1740    PT Time Calculation (min) 40 min    Equipment Utilized During Treatment --    Activity Tolerance Patient tolerated treatment well    Behavior During Therapy [facility] for tasks assessed/performed               Past Medical History:  Diagnosis Date   Anemia    Blood transfusion without reported diagnosis    Constipation, chronic    Enlarged heart    Hypertension    Uterine fibroid    Past Surgical History:  Procedure Laterality Date   TUBAL LIGATION     Patient Active Problem List   Diagnosis Date Noted   Chest pain 02/06/2023   Hypokalemia 01/25/2023   Gout 01/25/2023   Chronic pain disorder 01/25/2023   Essential hypertension 08/06/2015   Hypertensive heart disease 08/06/2015   Morbid obesity (HCC) 08/06/2015   Cardiac murmur 08/06/2015    PCP: Georganna Skeans, MD   REFERRING PROVIDER: Carmel Sacramento, NP  REFERRING DIAG: osteoarthritis localized primary knee left  THERAPY DIAG:  Muscle weakness (generalized)  Chronic pain of left knee  Other abnormalities of gait and mobility  Rationale for Evaluation and Treatment: Rehabilitation  ONSET DATE: chronic  SUBJECTIVE:   SUBJECTIVE STATEMENT: Patient reports that she was sore for a couple days after her last session but that it has resolved now. She does not endorse any pain currently.  PERTINENT HISTORY: None available PAIN:  Are you having pain? Yes: NPRS scale: 0/10 at rest, 10/10 at worst   Pain location: L knee Pain description: ache Aggravating factors: undetermined Relieving factors: rest  PRECAUTIONS: None  RED FLAGS: None   WEIGHT  BEARING RESTRICTIONS: No  FALLS:  Has patient fallen in last 6 months? No  OCCUPATION: not working  PLOF: Independent  PATIENT GOALS: To walk again  NEXT MD VISIT: 01/22/24  OBJECTIVE:  Note: Objective measures were completed at Evaluation unless otherwise noted.  DIAGNOSTIC FINDINGS: none for knee  PATIENT SURVEYS:  Patient-specific activity scoring scheme (Point to one number):  "0" represents "unable to perform." "10" represents "able to perform at prior level. 0 1 2 3 4 5 6 7 8 9  10 (Date and Score) Activity Initial  Activity Eval     STS tranfers  3    Static standing  3    ambulation 0    Additional Additional Total score = sum of the activity scores/number of activities Minimum detectable change (90%CI) for average score = 2 points Minimum detectable change (90%CI) for single activity score = 3 points PSFS developed by: Jake Seats., & Binkley, J. (1995). Assessing disability and change on individual  patients: a report of a patient specific measure. Physiotherapy [place], 47, [phone]. Reproduced with the permission of the authors  Score: 6/30 20% functional  MUSCLE LENGTH: Hamstrings: restricted B, measured in sitting  POSTURE: deferred  LOWER EXTREMITY ROM:  Active ROM Right eval Left eval  Hip flexion    Hip extension    Hip abduction    Hip adduction  Hip internal rotation    Hip external rotation    Knee flexion 100d 100d  Knee extension -10d -10d  Ankle dorsiflexion [facility] WFL  Ankle plantarflexion [facility] WFL  Ankle inversion    Ankle eversion     (Blank rows = not tested)  LOWER EXTREMITY MMT:  MMT Right eval Left eval  Hip flexion 3+ 3+  Hip extension    Hip abduction    Hip adduction    Hip internal rotation    Hip external rotation    Knee flexion 3 3  Knee extension 3 3  Ankle dorsiflexion    Ankle plantarflexion    Ankle inversion    Ankle eversion     (Blank rows = not tested)  LOWER EXTREMITY  SPECIAL TESTS:  deferred  FUNCTIONAL TESTS:  30 seconds chair stand test 1 STS transfers CGA  GAIT: Distance walked: 24ft patient self confined to [facility] Assistive device utilized: Wheelchair (manual) Level of assistance: Total A Comments: CG transports patient   [facility] Adult PT Treatment:                                                DATE: 01/11/24 Therapeutic Exercise: Nustep level 3 x 5 mins while gathering subjective Seated LAQ w/ lift 2x12, #4 Seated marching, 2x12, #4 Therapeutic Activity: STS 2x6 - cues for form (forward trunk lean) Therapeutic rest breaks as needed along with discussion of gradual strengthening during progression of PT as well as aquatic therapy for gait training                                                                                                                               [facility] Adult PT Treatment:                                                DATE: 01/04/2024 Therapeutic Exercise: Seated LAQ w/ lift 2x12, #4  Seated banded Abduction, 2x12 w/3s hold, Blue Seated marching, 2x12, #4 Therapeutic Activity: Gait training in // 20' STS 2x6  12/28/23 Eval and HEP    PATIENT EDUCATION:  Education details: Discussed eval findings, rehab rationale and POC and patient is in agreement  Person educated: Patient Education method: Explanation Education comprehension: verbalized understanding and needs further education  HOME EXERCISE PROGRAM: Access Code: [email or web address] URL: [email or web address] Date: 12/28/2023 Prepared by: Gustavus Bryant  Exercises - Seated Long Arc Quad  - 3 x daily - 5 x weekly - 2 sets - 10 reps - Seated Heel Slide  - 3 x daily - 5 x weekly - 2 sets - 10 reps - Seated March  - 3 x daily - 5 x weekly - 2  sets - 10 reps - Sit to Stand with Counter Support  - 3 x daily - 5 x weekly - 1 sets - 5 reps  ASSESSMENT:  CLINICAL IMPRESSION: Patient presents to PT reporting minimal pain in her knee and that she had some  soreness after the last session which resolved in a few days. Session today continued to focus on LE strengthening, STS form, and building confidence in standing positions. Discussed possibility of aquatic therapy to begin working on walking in a gravity reduced environment to work on form and confidence with ambulation, will discuss with evaluating therapist. She responds well to positive encouragement throughout session. Patient continues to benefit from skilled PT services and should be progressed as able to improve functional independence.    Eval Impression (12/28/2023):Patient is a 53 y.o. female who was seen today for physical therapy evaluation and treatment for L knee pain and decreased functional mobility.  She presents with decreased B hip and knee extension as observed in standing.  Patient requires CGA to perform STS transfer and S to remain standing.  Scope of assessment limited due to patient apprehension regarding mobility and movement.  Unable to assess ambulation with AD due to time constraints.  Rehab potential is limited due to patient apprehension regarding mobility and ambulation.   OBJECTIVE IMPAIRMENTS: Abnormal gait, decreased activity tolerance, decreased balance, decreased endurance, decreased knowledge of condition, decreased knowledge of use of DME, decreased mobility, difficulty walking, decreased strength, and pain.   ACTIVITY LIMITATIONS: carrying, lifting, standing, stairs, and transfers  PERSONAL FACTORS: Age, Fitness, and Time since onset of injury/illness/exacerbation are also affecting patient's functional outcome.   REHAB POTENTIAL: Fair due to chronicity and body habitus  CLINICAL DECISION MAKING: Stable/uncomplicated  EVALUATION COMPLEXITY: Low   GOALS: Goals reviewed with patient? No  SHORT TERM GOALS=LONG TERM GOALS: Target date: 02/25/24 Patient to demonstrate independence in HEP  Baseline: [id number] Goal status: INITIAL  2.  Patient will increase 30s  chair stand reps from 1 to 4 with/without arms to demonstrate and improved functional ability with less pain/difficulty as well as reduce fall risk.  Baseline: 1 with CGA Goal status: INITIAL  3.  Patient will score at least 50% on PSFS to signify clinically meaningful improvement in functional abilities.   Baseline: 20% Goal status: INITIAL  4.  250ft ambulation with LRAD Baseline: non-ambulatory on eval Goal status: INITIAL     PLAN:  PT FREQUENCY: 1x/week  PT DURATION: 4 weeks  PLANNED INTERVENTIONS: 97164- PT Re-evaluation, 97110-Therapeutic exercises, 97530- Therapeutic activity, 97112- Neuromuscular re-education, 97535- Self Care, 29562- Manual therapy, [id number]- Gait training, Balance training, and Stair training  PLAN FOR NEXT SESSION: HEP review and update, manual techniques as appropriate, aerobic tasks, ROM and flexibility activities, strengthening and PREs, TPDN, gait and balance training as needed     Berta Minor PTA 01/04/2024, 5:48 PM

## 2024-01-18 ENCOUNTER — Ambulatory Visit: Payer: No Typology Code available for payment source | Admitting: Physical Therapy

## 2024-01-20 DIAGNOSIS — Z419 Encounter for procedure for purposes other than remedying health state, unspecified: Secondary | ICD-10-CM | POA: Diagnosis not present

## 2024-01-22 DIAGNOSIS — Z6841 Body Mass Index (BMI) 40.0 and over, adult: Secondary | ICD-10-CM | POA: Diagnosis not present

## 2024-01-22 DIAGNOSIS — Z79899 Other long term (current) drug therapy: Secondary | ICD-10-CM | POA: Diagnosis not present

## 2024-01-22 DIAGNOSIS — M1712 Unilateral primary osteoarthritis, left knee: Secondary | ICD-10-CM | POA: Diagnosis not present

## 2024-01-22 DIAGNOSIS — R030 Elevated blood-pressure reading, without diagnosis of hypertension: Secondary | ICD-10-CM | POA: Diagnosis not present

## 2024-01-25 ENCOUNTER — Ambulatory Visit: Payer: Medicaid Other | Attending: Nurse Practitioner | Admitting: Physical Therapy

## 2024-01-25 DIAGNOSIS — R2689 Other abnormalities of gait and mobility: Secondary | ICD-10-CM | POA: Insufficient documentation

## 2024-01-25 DIAGNOSIS — G8929 Other chronic pain: Secondary | ICD-10-CM | POA: Insufficient documentation

## 2024-01-25 DIAGNOSIS — M6281 Muscle weakness (generalized): Secondary | ICD-10-CM | POA: Insufficient documentation

## 2024-01-25 DIAGNOSIS — M25562 Pain in left knee: Secondary | ICD-10-CM | POA: Insufficient documentation

## 2024-02-01 ENCOUNTER — Encounter: Payer: Self-pay | Admitting: Physical Therapy

## 2024-02-01 ENCOUNTER — Ambulatory Visit: Payer: Medicaid Other | Admitting: Physical Therapy

## 2024-02-01 DIAGNOSIS — G8929 Other chronic pain: Secondary | ICD-10-CM | POA: Diagnosis not present

## 2024-02-01 DIAGNOSIS — R2689 Other abnormalities of gait and mobility: Secondary | ICD-10-CM | POA: Diagnosis not present

## 2024-02-01 DIAGNOSIS — M6281 Muscle weakness (generalized): Secondary | ICD-10-CM | POA: Diagnosis not present

## 2024-02-01 DIAGNOSIS — M25562 Pain in left knee: Secondary | ICD-10-CM | POA: Diagnosis not present

## 2024-02-01 NOTE — Therapy (Signed)
 OUTPATIENT PHYSICAL THERAPY TREATMENT AND PROGRESS NOTE   Patient Name: Vickie Patel MRN: [id number] DOB:1971/02/08, 53 y.o., female Today's Date: 02/01/2024  END OF SESSION:  PT End of Session - 02/01/24 1651     Visit Number 4    Number of Visits 7    Date for PT Re-Evaluation 02/25/24    PT Start Time 1655    PT Stop Time 1740    PT Time Calculation (min) 45 min                Past Medical History:  Diagnosis Date   Anemia    Blood transfusion without reported diagnosis    Constipation, chronic    Enlarged heart    Hypertension    Uterine fibroid    Past Surgical History:  Procedure Laterality Date   TUBAL LIGATION     Patient Active Problem List   Diagnosis Date Noted   Chest pain 02/06/2023   Hypokalemia 01/25/2023   Gout 01/25/2023   Chronic pain disorder 01/25/2023   Essential hypertension 08/06/2015   Hypertensive heart disease 08/06/2015   Morbid obesity (HCC) 08/06/2015   Cardiac murmur 08/06/2015    PCP: Georganna Skeans, MD   REFERRING PROVIDER: Carmel Sacramento, NP  REFERRING DIAG: osteoarthritis localized primary knee left  THERAPY DIAG:  Muscle weakness (generalized)  Chronic pain of left knee  Other abnormalities of gait and mobility  Rationale for Evaluation and Treatment: Rehabilitation  ONSET DATE: chronic  SUBJECTIVE:   SUBJECTIVE STATEMENT: Patient reports that she hasn't been doing any exercises at home as she's been doing a lot at home.  Has been walking using a walker at times, reports walking upwards of 10 minutes.  PERTINENT HISTORY: None available PAIN:  Are you having pain? Yes: NPRS scale: 0/10 at rest, 10/10 at worst   Pain location: L knee Pain description: ache Aggravating factors: undetermined Relieving factors: rest  PRECAUTIONS: None  RED FLAGS: None   WEIGHT BEARING RESTRICTIONS: No  FALLS:  Has patient fallen in last 6 months? No  OCCUPATION: not working  PLOF: Independent  PATIENT GOALS:  To walk again  NEXT MD VISIT: 01/22/24  OBJECTIVE:  Note: Objective measures were completed at Evaluation unless otherwise noted.  DIAGNOSTIC FINDINGS: none for knee  PATIENT SURVEYS:  Patient-specific activity scoring scheme (Point to one number):  "0" represents "unable to perform." "10" represents "able to perform at prior level. 0 1 2 3 4 5 6 7 8 9  10 (Date and Score) Activity Initial  Activity Eval     STS tranfers  3 8   Static standing  3 8   ambulation 0 8 (w/fww)   Additional Additional Total score = sum of the activity scores/number of activities Minimum detectable change (90%CI) for average score = 2 points Minimum detectable change (90%CI) for single activity score = 3 points PSFS developed by: Jake Seats., & Binkley, J. (1995). Assessing disability and change on individual  patients: a report of a patient specific measure. Physiotherapy [place], 47, [phone]. Reproduced with the permission of the authors  Score: 6/30 20% functional  MUSCLE LENGTH: Hamstrings: restricted B, measured in sitting  POSTURE: deferred  LOWER EXTREMITY ROM:  Active ROM Right eval Left eval  Hip flexion    Hip extension    Hip abduction    Hip adduction    Hip internal rotation    Hip external rotation    Knee flexion 100d 100d  Knee extension -10d -10d  Ankle dorsiflexion [facility]  Ankle plantarflexion [facility] WFL  Ankle inversion    Ankle eversion     (Blank rows = not tested)  LOWER EXTREMITY MMT:  MMT Right eval Left eval  Hip flexion 3+ 3+  Hip extension    Hip abduction    Hip adduction    Hip internal rotation    Hip external rotation    Knee flexion 3 3  Knee extension 3 3  Ankle dorsiflexion    Ankle plantarflexion    Ankle inversion    Ankle eversion     (Blank rows = not tested)  LOWER EXTREMITY SPECIAL TESTS:  deferred  FUNCTIONAL TESTS:  30 seconds chair stand test 1 (02/01/2024: 2 stands no AD)  STS transfers  CGA  GAIT: Distance walked: 61ft patient self confined to [facility] Assistive device utilized: Wheelchair (manual) Level of assistance: Total A Comments: CG transports patient   [facility] Adult PT Treatment:                                                DATE: 02/01/2024 Therapeutic Activity: Re-evaluative measurements Self care POC discussion Patient education   [facility] Adult PT Treatment:                                                DATE: 01/11/24 Therapeutic Exercise: Nustep level 3 x 5 mins while gathering subjective Seated LAQ w/ lift 2x12, #4 Seated marching, 2x12, #4 Therapeutic Activity: STS 2x6 - cues for form (forward trunk lean) Therapeutic rest breaks as needed along with discussion of gradual strengthening during progression of PT as well as aquatic therapy for gait training    PATIENT EDUCATION:  Education details: Discussed eval findings, rehab rationale and POC and patient is in agreement  Person educated: Patient Education method: Explanation Education comprehension: verbalized understanding and needs further education  HOME EXERCISE PROGRAM: Access Code: [email or web address] URL: [email or web address] Date: 02/01/2024 Prepared by: Sheliah Plane  Exercises - Seated Hamstring Stretch  - 1 x daily - 7 x weekly - 3 sets - 1 reps - 60m hold - Supine Hamstring Stretch with Strap  - 1 x daily - 7 x weekly - 3 sets - 1 reps - 75m hold - Sit to Stand  - 1 x daily - 7 x weekly - 2-3 sets - 8 reps  ASSESSMENT:  CLINICAL IMPRESSION: Pt attended physical therapy session for re-evaluation of L knee pain . Pt has made moderate progress in all areas of current POC. Pt has met 1 goal and continues to work towards 3 others. Difficulties continue with confidence in functional ability. Pt required maximal verbal/tactile cuing as well as minimal assistance for safe and appropriate performance of today's activities. Continue with therapeutic focus on confidence with ambulation, transfer  quality, Knee ROM into extension, and building compliance with HEP.   Eval Impression (12/28/2023):Patient is a 53 y.o. female who was seen today for physical therapy evaluation and treatment for L knee pain and decreased functional mobility.  She presents with decreased B hip and knee extension as observed in standing.  Patient requires CGA to perform STS transfer and S to remain standing.  Scope of assessment limited due to patient apprehension regarding mobility and  movement.  Unable to assess ambulation with AD due to time constraints.  Rehab potential is limited due to patient apprehension regarding mobility and ambulation.   OBJECTIVE IMPAIRMENTS: Abnormal gait, decreased activity tolerance, decreased balance, decreased endurance, decreased knowledge of condition, decreased knowledge of use of DME, decreased mobility, difficulty walking, decreased strength, and pain.   ACTIVITY LIMITATIONS: carrying, lifting, standing, stairs, and transfers  PERSONAL FACTORS: Age, Fitness, and Time since onset of injury/illness/exacerbation are also affecting patient's functional outcome.   REHAB POTENTIAL: Fair due to chronicity and body habitus  CLINICAL DECISION MAKING: Stable/uncomplicated  EVALUATION COMPLEXITY: Low   GOALS: Goals reviewed with patient? No  SHORT TERM GOALS=LONG TERM GOALS: Target date: 02/25/24 Patient to demonstrate independence in HEP  Baseline:  Did not perform Goal status: PROGRESSING 02/01/2024  2.  Patient will increase 30s chair stand reps from 1 to 4 with/without arms to demonstrate and improved functional ability with less pain/difficulty as well as reduce fall risk.  Baseline: 1 with CGA Goal status: PROGRESSING 02/01/2024  3.  Patient will score at least 50% on PSFS to signify clinically meaningful improvement in functional abilities.   Baseline: 20% Goal status: MET 02/01/2024  4.  260ft ambulation with LRAD Baseline: non-ambulatory on eval Goal status:  PROGRESSING 02/01/2024     PLAN:  PT FREQUENCY: 1x/week  PT DURATION: 4 weeks  PLANNED INTERVENTIONS: 97164- PT Re-evaluation, 97110-Therapeutic exercises, 97530- Therapeutic activity, 97112- Neuromuscular re-education, 97535- Self Care, 16109- Manual therapy, [phone]- Gait training, Balance training, and Stair training  PLAN FOR NEXT SESSION: HEP review and update, manual techniques as appropriate, aerobic tasks, ROM and flexibility activities, strengthening and PREs, TPDN, gait and balance training as needed     Sheliah Plane, PT, DPT 02/01/2024, 5:46 PM

## 2024-02-07 NOTE — Therapy (Deleted)
 OUTPATIENT PHYSICAL THERAPY TREATMENT AND PROGRESS NOTE   Patient Name: Vickie Patel MRN: [id number] DOB:07-Nov-1971, 53 y.o., female Today's Date: 02/07/2024  END OF SESSION:       Past Medical History:  Diagnosis Date   Anemia    Blood transfusion without reported diagnosis    Constipation, chronic    Enlarged heart    Hypertension    Uterine fibroid    Past Surgical History:  Procedure Laterality Date   TUBAL LIGATION     Patient Active Problem List   Diagnosis Date Noted   Chest pain 02/06/2023   Hypokalemia 01/25/2023   Gout 01/25/2023   Chronic pain disorder 01/25/2023   Essential hypertension 08/06/2015   Hypertensive heart disease 08/06/2015   Morbid obesity (HCC) 08/06/2015   Cardiac murmur 08/06/2015    PCP: Georganna Skeans, MD   REFERRING PROVIDER: Carmel Sacramento, NP  REFERRING DIAG: osteoarthritis localized primary knee left  THERAPY DIAG:  No diagnosis found.  Rationale for Evaluation and Treatment: Rehabilitation  ONSET DATE: chronic  SUBJECTIVE:   SUBJECTIVE STATEMENT: Patient reports that she hasn't been doing any exercises at home as she's been doing a lot at home.  Has been walking using a walker at times, reports walking upwards of 10 minutes.  PERTINENT HISTORY: None available PAIN:  Are you having pain? Yes: NPRS scale: 0/10 at rest, 10/10 at worst   Pain location: L knee Pain description: ache Aggravating factors: undetermined Relieving factors: rest  PRECAUTIONS: None  RED FLAGS: None   WEIGHT BEARING RESTRICTIONS: No  FALLS:  Has patient fallen in last 6 months? No  OCCUPATION: not working  PLOF: Independent  PATIENT GOALS: To walk again  NEXT MD VISIT: 01/22/24  OBJECTIVE:  Note: Objective measures were completed at Evaluation unless otherwise noted.  DIAGNOSTIC FINDINGS: none for knee  PATIENT SURVEYS:  Patient-specific activity scoring scheme (Point to one number):  "0" represents "unable to perform."  "10" represents "able to perform at prior level. 0 1 2 3 4 5 6 7 8 9  10 (Date and Score) Activity Initial  Activity Eval     STS tranfers  3 8   Static standing  3 8   ambulation 0 8 (w/fww)   Additional Additional Total score = sum of the activity scores/number of activities Minimum detectable change (90%CI) for average score = 2 points Minimum detectable change (90%CI) for single activity score = 3 points PSFS developed by: Jake Seats., & Binkley, J. (1995). Assessing disability and change on individual  patients: a report of a patient specific measure. Physiotherapy [place], 47, [phone]. Reproduced with the permission of the authors  Score: 6/30 20% functional  MUSCLE LENGTH: Hamstrings: restricted B, measured in sitting  POSTURE: deferred  LOWER EXTREMITY ROM:  Active ROM Right eval Left eval  Hip flexion    Hip extension    Hip abduction    Hip adduction    Hip internal rotation    Hip external rotation    Knee flexion 100d 100d  Knee extension -10d -10d  Ankle dorsiflexion [facility]  Ankle plantarflexion [facility] WFL  Ankle inversion    Ankle eversion     (Blank rows = not tested)  LOWER EXTREMITY MMT:  MMT Right eval Left eval  Hip flexion 3+ 3+  Hip extension    Hip abduction    Hip adduction    Hip internal rotation    Hip external rotation    Knee flexion 3 3  Knee extension 3 3  Ankle dorsiflexion    Ankle plantarflexion    Ankle inversion    Ankle eversion     (Blank rows = not tested)  LOWER EXTREMITY SPECIAL TESTS:  deferred  FUNCTIONAL TESTS:  30 seconds chair stand test 1 (02/01/2024: 2 stands no AD)  STS transfers CGA  GAIT: Distance walked: 70ft patient self confined to [facility] Assistive device utilized: Wheelchair (manual) Level of assistance: Total A Comments: CG transports patient   [facility] Adult PT Treatment:                                                DATE: 02/01/2024 Therapeutic Activity: Re-evaluative  measurements Self care POC discussion Patient education   [facility] Adult PT Treatment:                                                DATE: 01/11/24 Therapeutic Exercise: Nustep level 3 x 5 mins while gathering subjective Seated LAQ w/ lift 2x12, #4 Seated marching, 2x12, #4 Therapeutic Activity: STS 2x6 - cues for form (forward trunk lean) Therapeutic rest breaks as needed along with discussion of gradual strengthening during progression of PT as well as aquatic therapy for gait training    PATIENT EDUCATION:  Education details: Discussed eval findings, rehab rationale and POC and patient is in agreement  Person educated: Patient Education method: Explanation Education comprehension: verbalized understanding and needs further education  HOME EXERCISE PROGRAM: Access Code: [email or web address] URL: [email or web address] Date: 02/01/2024 Prepared by: Sheliah Plane  Exercises - Seated Hamstring Stretch  - 1 x daily - 7 x weekly - 3 sets - 1 reps - 71m hold - Supine Hamstring Stretch with Strap  - 1 x daily - 7 x weekly - 3 sets - 1 reps - 67m hold - Sit to Stand  - 1 x daily - 7 x weekly - 2-3 sets - 8 reps  ASSESSMENT:  CLINICAL IMPRESSION: Pt attended physical therapy session for re-evaluation of L knee pain . Pt has made moderate progress in all areas of current POC. Pt has met 1 goal and continues to work towards 3 others. Difficulties continue with confidence in functional ability. Pt required maximal verbal/tactile cuing as well as minimal assistance for safe and appropriate performance of today's activities. Continue with therapeutic focus on confidence with ambulation, transfer quality, Knee ROM into extension, and building compliance with HEP.   Eval Impression (12/28/2023):Patient is a 53 y.o. female who was seen today for physical therapy evaluation and treatment for L knee pain and decreased functional mobility.  She presents with decreased B hip and knee extension as  observed in standing.  Patient requires CGA to perform STS transfer and S to remain standing.  Scope of assessment limited due to patient apprehension regarding mobility and movement.  Unable to assess ambulation with AD due to time constraints.  Rehab potential is limited due to patient apprehension regarding mobility and ambulation.   OBJECTIVE IMPAIRMENTS: Abnormal gait, decreased activity tolerance, decreased balance, decreased endurance, decreased knowledge of condition, decreased knowledge of use of DME, decreased mobility, difficulty walking, decreased strength, and pain.   ACTIVITY LIMITATIONS: carrying, lifting, standing, stairs, and transfers  PERSONAL FACTORS: Age, Fitness, and Time since  onset of injury/illness/exacerbation are also affecting patient's functional outcome.   REHAB POTENTIAL: Fair due to chronicity and body habitus  CLINICAL DECISION MAKING: Stable/uncomplicated  EVALUATION COMPLEXITY: Low   GOALS: Goals reviewed with patient? No  SHORT TERM GOALS=LONG TERM GOALS: Target date: 02/25/24 Patient to demonstrate independence in HEP  Baseline:  Did not perform Goal status: PROGRESSING 02/01/2024  2.  Patient will increase 30s chair stand reps from 1 to 4 with/without arms to demonstrate and improved functional ability with less pain/difficulty as well as reduce fall risk.  Baseline: 1 with CGA Goal status: PROGRESSING 02/01/2024  3.  Patient will score at least 50% on PSFS to signify clinically meaningful improvement in functional abilities.   Baseline: 20% Goal status: MET 02/01/2024  4.  272ft ambulation with LRAD Baseline: non-ambulatory on eval Goal status: PROGRESSING 02/01/2024     PLAN:  PT FREQUENCY: 1x/week  PT DURATION: 4 weeks  PLANNED INTERVENTIONS: 97164- PT Re-evaluation, 97110-Therapeutic exercises, 97530- Therapeutic activity, 97112- Neuromuscular re-education, 97535- Self Care, 45409- Manual therapy, [phone]- Gait training, Balance training,  and Stair training  PLAN FOR NEXT SESSION: HEP review and update, manual techniques as appropriate, aerobic tasks, ROM and flexibility activities, strengthening and PREs, TPDN, gait and balance training as needed     Sheliah Plane, PT, DPT 02/07/2024, 10:32 AM

## 2024-02-08 ENCOUNTER — Ambulatory Visit: Payer: Medicaid Other

## 2024-02-15 ENCOUNTER — Encounter: Payer: Self-pay | Admitting: Physical Therapy

## 2024-02-15 ENCOUNTER — Ambulatory Visit: Payer: Medicaid Other | Admitting: Physical Therapy

## 2024-02-15 DIAGNOSIS — M6281 Muscle weakness (generalized): Secondary | ICD-10-CM | POA: Diagnosis not present

## 2024-02-15 DIAGNOSIS — G8929 Other chronic pain: Secondary | ICD-10-CM

## 2024-02-15 DIAGNOSIS — R2689 Other abnormalities of gait and mobility: Secondary | ICD-10-CM

## 2024-02-15 DIAGNOSIS — M25562 Pain in left knee: Secondary | ICD-10-CM | POA: Diagnosis not present
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
## 2024-02-17 DIAGNOSIS — Z419 Encounter for procedure for purposes other than remedying health state, unspecified: Secondary | ICD-10-CM | POA: Diagnosis not present

## 2024-02-21 DIAGNOSIS — M545 Low back pain, unspecified: Secondary | ICD-10-CM | POA: Diagnosis not present

## 2024-02-21 DIAGNOSIS — R030 Elevated blood-pressure reading, without diagnosis of hypertension: Secondary | ICD-10-CM | POA: Diagnosis not present

## 2024-02-21 DIAGNOSIS — M25552 Pain in left hip: Secondary | ICD-10-CM | POA: Diagnosis not present

## 2024-02-21 DIAGNOSIS — Z79899 Other long term (current) drug therapy: Secondary | ICD-10-CM | POA: Diagnosis not present

## 2024-02-21 DIAGNOSIS — Z131 Encounter for screening for diabetes mellitus: Secondary | ICD-10-CM | POA: Diagnosis not present

## 2024-02-21 DIAGNOSIS — M1712 Unilateral primary osteoarthritis, left knee: Secondary | ICD-10-CM | POA: Diagnosis not present

## 2024-02-22 ENCOUNTER — Ambulatory Visit: Payer: Medicaid Other | Admitting: Physical Therapy

## 2024-03-06 ENCOUNTER — Ambulatory Visit: Admitting: Physical Therapy

## 2024-03-07 ENCOUNTER — Telehealth: Payer: Self-pay | Admitting: Physical Therapy

## 2024-03-07 ENCOUNTER — Ambulatory Visit: Admitting: Physical Therapy

## 2024-03-07 NOTE — Telephone Encounter (Signed)
 An attempt was made to contact pt via phone call at 3:18 PM a voicemail was left educating regarding attendance policy, effect of current POC, and need for new referral in line with attendance policy.   Sheliah Plane, PT, DPT 03/07/2024, 3:18 PM

## 2024-03-13 ENCOUNTER — Encounter: Admitting: Physical Therapy

## 2024-03-21 DIAGNOSIS — M1712 Unilateral primary osteoarthritis, left knee: Secondary | ICD-10-CM | POA: Diagnosis not present

## 2024-03-21 DIAGNOSIS — R030 Elevated blood-pressure reading, without diagnosis of hypertension: Secondary | ICD-10-CM | POA: Diagnosis not present

## 2024-03-30 DIAGNOSIS — Z419 Encounter for procedure for purposes other than remedying health state, unspecified: Secondary | ICD-10-CM | POA: Diagnosis not present

## 2024-04-22 DIAGNOSIS — R030 Elevated blood-pressure reading, without diagnosis of hypertension: Secondary | ICD-10-CM | POA: Diagnosis not present

## 2024-04-22 DIAGNOSIS — I10 Essential (primary) hypertension: Secondary | ICD-10-CM | POA: Diagnosis not present

## 2024-04-22 DIAGNOSIS — M1712 Unilateral primary osteoarthritis, left knee: Secondary | ICD-10-CM | POA: Diagnosis not present

## 2024-04-29 DIAGNOSIS — Z419 Encounter for procedure for purposes other than remedying health state, unspecified: Secondary | ICD-10-CM | POA: Diagnosis not present

## 2024-05-21 ENCOUNTER — Ambulatory Visit (HOSPITAL_BASED_OUTPATIENT_CLINIC_OR_DEPARTMENT_OTHER): Attending: Nurse Practitioner | Admitting: Physical Therapy

## 2024-05-23 DIAGNOSIS — M1712 Unilateral primary osteoarthritis, left knee: Secondary | ICD-10-CM | POA: Diagnosis not present

## 2024-05-23 DIAGNOSIS — I10 Essential (primary) hypertension: Secondary | ICD-10-CM | POA: Diagnosis not present

## 2024-05-23 DIAGNOSIS — R030 Elevated blood-pressure reading, without diagnosis of hypertension: Secondary | ICD-10-CM | POA: Diagnosis not present

## 2024-05-23 DIAGNOSIS — Z6841 Body Mass Index (BMI) 40.0 and over, adult: Secondary | ICD-10-CM | POA: Diagnosis not present

## 2024-05-23 DIAGNOSIS — N1831 Chronic kidney disease, stage 3a: Secondary | ICD-10-CM | POA: Diagnosis not present

## 2024-05-23 DIAGNOSIS — Z79899 Other long term (current) drug therapy: Secondary | ICD-10-CM | POA: Diagnosis not present

## 2024-05-30 DIAGNOSIS — Z419 Encounter for procedure for purposes other than remedying health state, unspecified: Secondary | ICD-10-CM | POA: Diagnosis not present

## 2024-06-22 DIAGNOSIS — M1712 Unilateral primary osteoarthritis, left knee: Secondary | ICD-10-CM | POA: Diagnosis not present

## 2024-06-22 DIAGNOSIS — E559 Vitamin D deficiency, unspecified: Secondary | ICD-10-CM | POA: Diagnosis not present

## 2024-06-22 DIAGNOSIS — N1831 Chronic kidney disease, stage 3a: Secondary | ICD-10-CM | POA: Diagnosis not present

## 2024-06-22 DIAGNOSIS — Z79899 Other long term (current) drug therapy: Secondary | ICD-10-CM | POA: Diagnosis not present

## 2024-06-22 DIAGNOSIS — Z6841 Body Mass Index (BMI) 40.0 and over, adult: Secondary | ICD-10-CM | POA: Diagnosis not present

## 2024-06-22 DIAGNOSIS — R030 Elevated blood-pressure reading, without diagnosis of hypertension: Secondary | ICD-10-CM | POA: Diagnosis not present

## 2024-06-22 DIAGNOSIS — I10 Essential (primary) hypertension: Secondary | ICD-10-CM | POA: Diagnosis not present

## 2024-06-22 DIAGNOSIS — M79641 Pain in right hand: Secondary | ICD-10-CM | POA: Diagnosis not present

## 2024-06-26 ENCOUNTER — Ambulatory Visit (HOSPITAL_BASED_OUTPATIENT_CLINIC_OR_DEPARTMENT_OTHER): Attending: Family Medicine | Admitting: Physical Therapy

## 2024-06-26 ENCOUNTER — Other Ambulatory Visit: Payer: Self-pay

## 2024-06-26 ENCOUNTER — Encounter (HOSPITAL_BASED_OUTPATIENT_CLINIC_OR_DEPARTMENT_OTHER): Payer: Self-pay | Admitting: Physical Therapy

## 2024-06-26 DIAGNOSIS — G8929 Other chronic pain: Secondary | ICD-10-CM | POA: Insufficient documentation

## 2024-06-26 DIAGNOSIS — H00011 Hordeolum externum right upper eyelid: Secondary | ICD-10-CM | POA: Diagnosis not present

## 2024-06-26 DIAGNOSIS — M6281 Muscle weakness (generalized): Secondary | ICD-10-CM | POA: Insufficient documentation

## 2024-06-26 DIAGNOSIS — M25562 Pain in left knee: Secondary | ICD-10-CM | POA: Diagnosis present

## 2024-06-26 DIAGNOSIS — R2689 Other abnormalities of gait and mobility: Secondary | ICD-10-CM | POA: Insufficient documentation

## 2024-06-26 DIAGNOSIS — L03213 Periorbital cellulitis: Secondary | ICD-10-CM | POA: Diagnosis not present
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
## 2024-06-29 DIAGNOSIS — Z419 Encounter for procedure for purposes other than remedying health state, unspecified: Secondary | ICD-10-CM | POA: Diagnosis not present

## 2024-07-03 ENCOUNTER — Ambulatory Visit (HOSPITAL_BASED_OUTPATIENT_CLINIC_OR_DEPARTMENT_OTHER): Admitting: Physical Therapy

## 2024-07-03 ENCOUNTER — Encounter (HOSPITAL_BASED_OUTPATIENT_CLINIC_OR_DEPARTMENT_OTHER): Payer: Self-pay | Admitting: Physical Therapy

## 2024-07-03 DIAGNOSIS — G8929 Other chronic pain: Secondary | ICD-10-CM

## 2024-07-03 DIAGNOSIS — M6281 Muscle weakness (generalized): Secondary | ICD-10-CM | POA: Diagnosis not present

## 2024-07-03 DIAGNOSIS — R2689 Other abnormalities of gait and mobility: Secondary | ICD-10-CM
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
## 2024-07-08 ENCOUNTER — Ambulatory Visit (HOSPITAL_BASED_OUTPATIENT_CLINIC_OR_DEPARTMENT_OTHER): Admitting: Physical Therapy

## 2024-07-10 ENCOUNTER — Encounter (HOSPITAL_BASED_OUTPATIENT_CLINIC_OR_DEPARTMENT_OTHER): Payer: Self-pay | Admitting: Physical Therapy

## 2024-07-10 ENCOUNTER — Ambulatory Visit (HOSPITAL_BASED_OUTPATIENT_CLINIC_OR_DEPARTMENT_OTHER): Admitting: Physical Therapy

## 2024-07-10 DIAGNOSIS — G8929 Other chronic pain: Secondary | ICD-10-CM

## 2024-07-10 DIAGNOSIS — M6281 Muscle weakness (generalized): Secondary | ICD-10-CM | POA: Diagnosis not present

## 2024-07-10 DIAGNOSIS — R2689 Other abnormalities of gait and mobility: Secondary | ICD-10-CM
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
## 2024-07-17 ENCOUNTER — Ambulatory Visit (HOSPITAL_BASED_OUTPATIENT_CLINIC_OR_DEPARTMENT_OTHER): Admitting: Physical Therapy

## 2024-07-19 ENCOUNTER — Ambulatory Visit (HOSPITAL_BASED_OUTPATIENT_CLINIC_OR_DEPARTMENT_OTHER): Attending: Family Medicine

## 2024-07-19 ENCOUNTER — Encounter (HOSPITAL_BASED_OUTPATIENT_CLINIC_OR_DEPARTMENT_OTHER): Payer: Self-pay

## 2024-07-19 DIAGNOSIS — M6281 Muscle weakness (generalized): Secondary | ICD-10-CM | POA: Diagnosis present

## 2024-07-19 DIAGNOSIS — G8929 Other chronic pain: Secondary | ICD-10-CM | POA: Insufficient documentation

## 2024-07-19 DIAGNOSIS — M25562 Pain in left knee: Secondary | ICD-10-CM | POA: Diagnosis present

## 2024-07-19 DIAGNOSIS — R2689 Other abnormalities of gait and mobility: Secondary | ICD-10-CM | POA: Diagnosis present
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
## 2024-07-20 DIAGNOSIS — I10 Essential (primary) hypertension: Secondary | ICD-10-CM | POA: Diagnosis not present

## 2024-07-20 DIAGNOSIS — M1712 Unilateral primary osteoarthritis, left knee: Secondary | ICD-10-CM | POA: Diagnosis not present

## 2024-07-20 DIAGNOSIS — Z79899 Other long term (current) drug therapy: Secondary | ICD-10-CM | POA: Diagnosis not present

## 2024-07-20 DIAGNOSIS — N1831 Chronic kidney disease, stage 3a: Secondary | ICD-10-CM | POA: Diagnosis not present

## 2024-07-23 ENCOUNTER — Ambulatory Visit (HOSPITAL_BASED_OUTPATIENT_CLINIC_OR_DEPARTMENT_OTHER): Admitting: Physical Therapy

## 2024-07-25 ENCOUNTER — Ambulatory Visit (HOSPITAL_BASED_OUTPATIENT_CLINIC_OR_DEPARTMENT_OTHER): Admitting: Physical Therapy

## 2024-07-26 ENCOUNTER — Ambulatory Visit (HOSPITAL_BASED_OUTPATIENT_CLINIC_OR_DEPARTMENT_OTHER): Admitting: Physical Therapy

## 2024-07-26 ENCOUNTER — Encounter (HOSPITAL_BASED_OUTPATIENT_CLINIC_OR_DEPARTMENT_OTHER): Payer: Self-pay | Admitting: Physical Therapy

## 2024-07-26 DIAGNOSIS — M6281 Muscle weakness (generalized): Secondary | ICD-10-CM | POA: Diagnosis not present

## 2024-07-26 DIAGNOSIS — G8929 Other chronic pain: Secondary | ICD-10-CM

## 2024-07-26 DIAGNOSIS — R2689 Other abnormalities of gait and mobility: Secondary | ICD-10-CM
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
## 2024-07-30 ENCOUNTER — Encounter (HOSPITAL_BASED_OUTPATIENT_CLINIC_OR_DEPARTMENT_OTHER): Payer: Self-pay | Admitting: Physical Therapy

## 2024-07-30 ENCOUNTER — Ambulatory Visit (HOSPITAL_BASED_OUTPATIENT_CLINIC_OR_DEPARTMENT_OTHER): Admitting: Physical Therapy

## 2024-07-30 DIAGNOSIS — Z419 Encounter for procedure for purposes other than remedying health state, unspecified: Secondary | ICD-10-CM | POA: Diagnosis not present

## 2024-07-30 DIAGNOSIS — M6281 Muscle weakness (generalized): Secondary | ICD-10-CM | POA: Diagnosis not present

## 2024-07-30 DIAGNOSIS — G8929 Other chronic pain: Secondary | ICD-10-CM

## 2024-07-30 DIAGNOSIS — R2689 Other abnormalities of gait and mobility: Secondary | ICD-10-CM
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
## 2024-08-01 ENCOUNTER — Ambulatory Visit (HOSPITAL_BASED_OUTPATIENT_CLINIC_OR_DEPARTMENT_OTHER)

## 2024-08-06 ENCOUNTER — Encounter (HOSPITAL_BASED_OUTPATIENT_CLINIC_OR_DEPARTMENT_OTHER): Payer: Self-pay | Admitting: Physical Therapy

## 2024-08-06 ENCOUNTER — Ambulatory Visit (HOSPITAL_BASED_OUTPATIENT_CLINIC_OR_DEPARTMENT_OTHER): Admitting: Physical Therapy

## 2024-08-06 DIAGNOSIS — M6281 Muscle weakness (generalized): Secondary | ICD-10-CM | POA: Diagnosis not present

## 2024-08-06 DIAGNOSIS — G8929 Other chronic pain: Secondary | ICD-10-CM

## 2024-08-06 DIAGNOSIS — R2689 Other abnormalities of gait and mobility: Secondary | ICD-10-CM
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
## 2024-08-08 ENCOUNTER — Ambulatory Visit (HOSPITAL_BASED_OUTPATIENT_CLINIC_OR_DEPARTMENT_OTHER): Admitting: Physical Therapy

## 2024-08-14 ENCOUNTER — Ambulatory Visit (HOSPITAL_BASED_OUTPATIENT_CLINIC_OR_DEPARTMENT_OTHER): Admitting: Physical Therapy

## 2024-08-14 ENCOUNTER — Encounter (HOSPITAL_BASED_OUTPATIENT_CLINIC_OR_DEPARTMENT_OTHER): Payer: Self-pay | Admitting: Physical Therapy

## 2024-08-14 DIAGNOSIS — M6281 Muscle weakness (generalized): Secondary | ICD-10-CM | POA: Diagnosis not present

## 2024-08-14 DIAGNOSIS — G8929 Other chronic pain: Secondary | ICD-10-CM

## 2024-08-14 DIAGNOSIS — R2689 Other abnormalities of gait and mobility: Secondary | ICD-10-CM
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
## 2024-08-16 ENCOUNTER — Ambulatory Visit (HOSPITAL_BASED_OUTPATIENT_CLINIC_OR_DEPARTMENT_OTHER): Admitting: Physical Therapy

## 2024-08-16 ENCOUNTER — Encounter (HOSPITAL_BASED_OUTPATIENT_CLINIC_OR_DEPARTMENT_OTHER): Payer: Self-pay | Admitting: Physical Therapy

## 2024-08-16 DIAGNOSIS — G8929 Other chronic pain: Secondary | ICD-10-CM

## 2024-08-16 DIAGNOSIS — M6281 Muscle weakness (generalized): Secondary | ICD-10-CM | POA: Diagnosis not present

## 2024-08-16 DIAGNOSIS — R2689 Other abnormalities of gait and mobility: Secondary | ICD-10-CM
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
## 2024-08-20 ENCOUNTER — Ambulatory Visit (HOSPITAL_BASED_OUTPATIENT_CLINIC_OR_DEPARTMENT_OTHER): Admitting: Physical Therapy

## 2024-08-21 DIAGNOSIS — Z1211 Encounter for screening for malignant neoplasm of colon: Secondary | ICD-10-CM | POA: Diagnosis not present

## 2024-08-21 DIAGNOSIS — M129 Arthropathy, unspecified: Secondary | ICD-10-CM | POA: Diagnosis not present

## 2024-08-21 DIAGNOSIS — Z1231 Encounter for screening mammogram for malignant neoplasm of breast: Secondary | ICD-10-CM | POA: Diagnosis not present

## 2024-08-21 DIAGNOSIS — R0602 Shortness of breath: Secondary | ICD-10-CM | POA: Diagnosis not present

## 2024-08-21 DIAGNOSIS — Z0000 Encounter for general adult medical examination without abnormal findings: Secondary | ICD-10-CM | POA: Diagnosis not present

## 2024-08-21 DIAGNOSIS — E559 Vitamin D deficiency, unspecified: Secondary | ICD-10-CM | POA: Diagnosis not present

## 2024-08-21 DIAGNOSIS — I10 Essential (primary) hypertension: Secondary | ICD-10-CM | POA: Diagnosis not present

## 2024-08-21 DIAGNOSIS — M1712 Unilateral primary osteoarthritis, left knee: Secondary | ICD-10-CM | POA: Diagnosis not present

## 2024-08-21 DIAGNOSIS — M25552 Pain in left hip: Secondary | ICD-10-CM | POA: Diagnosis not present

## 2024-08-21 DIAGNOSIS — R5383 Other fatigue: Secondary | ICD-10-CM | POA: Diagnosis not present

## 2024-08-21 DIAGNOSIS — F17210 Nicotine dependence, cigarettes, uncomplicated: Secondary | ICD-10-CM | POA: Diagnosis not present

## 2024-08-22 ENCOUNTER — Ambulatory Visit (HOSPITAL_BASED_OUTPATIENT_CLINIC_OR_DEPARTMENT_OTHER): Attending: Family Medicine | Admitting: Physical Therapy

## 2024-08-22 ENCOUNTER — Encounter (HOSPITAL_BASED_OUTPATIENT_CLINIC_OR_DEPARTMENT_OTHER): Admitting: Physical Therapy

## 2024-08-22 ENCOUNTER — Encounter (HOSPITAL_BASED_OUTPATIENT_CLINIC_OR_DEPARTMENT_OTHER): Payer: Self-pay | Admitting: Physical Therapy

## 2024-08-22 DIAGNOSIS — G8929 Other chronic pain: Secondary | ICD-10-CM | POA: Insufficient documentation

## 2024-08-22 DIAGNOSIS — M25562 Pain in left knee: Secondary | ICD-10-CM | POA: Insufficient documentation

## 2024-08-22 DIAGNOSIS — M6281 Muscle weakness (generalized): Secondary | ICD-10-CM | POA: Insufficient documentation

## 2024-08-22 DIAGNOSIS — R2689 Other abnormalities of gait and mobility: Secondary | ICD-10-CM | POA: Insufficient documentation
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
## 2024-08-23 ENCOUNTER — Ambulatory Visit (HOSPITAL_BASED_OUTPATIENT_CLINIC_OR_DEPARTMENT_OTHER): Admitting: Physical Therapy

## 2024-08-30 DIAGNOSIS — Z419 Encounter for procedure for purposes other than remedying health state, unspecified: Secondary | ICD-10-CM | POA: Diagnosis not present

## 2024-09-18 DIAGNOSIS — K047 Periapical abscess without sinus: Secondary | ICD-10-CM | POA: Diagnosis not present

## 2024-09-19 DIAGNOSIS — N1831 Chronic kidney disease, stage 3a: Secondary | ICD-10-CM | POA: Diagnosis not present

## 2024-09-19 DIAGNOSIS — M1712 Unilateral primary osteoarthritis, left knee: Secondary | ICD-10-CM | POA: Diagnosis not present

## 2024-09-19 DIAGNOSIS — I10 Essential (primary) hypertension: Secondary | ICD-10-CM | POA: Diagnosis not present

## 2024-09-19 DIAGNOSIS — Z1231 Encounter for screening mammogram for malignant neoplasm of breast: Secondary | ICD-10-CM | POA: Diagnosis not present

## 2024-09-19 DIAGNOSIS — Z780 Asymptomatic menopausal state: Secondary | ICD-10-CM | POA: Diagnosis not present

## 2024-09-25 ENCOUNTER — Ambulatory Visit (HOSPITAL_BASED_OUTPATIENT_CLINIC_OR_DEPARTMENT_OTHER): Attending: Family Medicine | Admitting: Physical Therapy

## 2024-09-29 DIAGNOSIS — Z419 Encounter for procedure for purposes other than remedying health state, unspecified: Secondary | ICD-10-CM | POA: Diagnosis not present

## 2024-10-21 DIAGNOSIS — Z79899 Other long term (current) drug therapy: Secondary | ICD-10-CM | POA: Diagnosis not present

## 2024-10-21 DIAGNOSIS — M1712 Unilateral primary osteoarthritis, left knee: Secondary | ICD-10-CM | POA: Diagnosis not present

## 2024-10-23 DIAGNOSIS — N898 Other specified noninflammatory disorders of vagina: Secondary | ICD-10-CM | POA: Diagnosis not present

## 2024-10-23 NOTE — Progress Notes (Signed)
   Subjective:   Chief Complaint  Patient presents with  . possible yeast infection     From antibiotics     53 year old female presents to the UC with vaginal itching. Patient was recently on antibiotics for a dental abscess. Patient endorses vaginal discharge, external vaginal irration and redness. Patient is also concerned for BV   Parts of patient history reviewed include PMH, problem list, medications, allergies, and social history.  Objective:   Vitals:   10/23/24 1625 10/23/24 1631  BP: (!) 198/95 (!) 179/87  BP Location: Left arm Right arm  Patient Position: Sitting Sitting  Pulse: 56   Resp: 16   Temp: 98.1 F (36.7 C)   TempSrc: Oral   SpO2: 100%   Weight: 122 kg (270 lb)   Height: 1.651 m (5' 5)     Physical Exam Vitals and nursing note reviewed.  Constitutional:      General: She is not in acute distress.    Appearance: Normal appearance. She is not ill-appearing.  HENT:     Right Ear: Tympanic membrane, ear canal and external ear normal.     Left Ear: Tympanic membrane, ear canal and external ear normal.     Mouth/Throat:     Mouth: Mucous membranes are moist.  Eyes:     Pupils: Pupils are equal, round, and reactive to light.  Cardiovascular:     Rate and Rhythm: Normal rate and regular rhythm.  Pulmonary:     Effort: Pulmonary effort is normal. No respiratory distress.     Breath sounds: Normal breath sounds. No wheezing, rhonchi or rales.  Musculoskeletal:     Cervical back: Normal range of motion and neck supple.  Skin:    General: Skin is warm and dry.     Capillary Refill: Capillary refill takes less than 2 seconds.     Findings: No rash.  Neurological:     General: No focal deficit present.     Mental Status: She is alert.            Assessment/Plan:   Vickie Patel was seen today for possible yeast infection .  Diagnoses and all orders for this visit:  Vaginal itching -     Bacterial Vaginosis (BV), Candida, and Trichomonas via NAA  (Swab)  Other orders -     fluconazole  (DIFLUCAN ) 150 mg tablet; Take one tablet now then repeat in 54 days   53 year old female presents with vaginal itching will treat with diflucan  and patient self swabbed for BV.   1.  Patient was instructed on when to follow up and know that they can follow up here, with their PCP, Urgent Care, ED.  They have been instructed that if symptoms worse that should return to the clinic , go to the nearest ED, or activate EMS.  2. Patient was given AVS and this was reviewed with them.  Red Flags associated with their diagnoses were reviewed and patient was educated on what to do if red flags develop.  3. Patient agreed with plan and voiced understanding.  No barriers to adherence perceived by myself.      Follow up with PCP   Electronically signed by: Lonell Jackquline Redo, FNP 10/24/2024 9:01 PM

## 2024-10-24 DIAGNOSIS — Z79899 Other long term (current) drug therapy: Secondary | ICD-10-CM | POA: Diagnosis not present

## 2024-11-20 DIAGNOSIS — M1712 Unilateral primary osteoarthritis, left knee: Secondary | ICD-10-CM | POA: Diagnosis not present

## 2024-11-20 DIAGNOSIS — E559 Vitamin D deficiency, unspecified: Secondary | ICD-10-CM | POA: Diagnosis not present

## 2024-11-20 DIAGNOSIS — Z6841 Body Mass Index (BMI) 40.0 and over, adult: Secondary | ICD-10-CM | POA: Diagnosis not present
